# Patient Record
Sex: Female | Born: 1955 | Smoking: Former smoker
Health system: Southern US, Community
[De-identification: ages and names within clinical notes are randomized; demographics above are authoritative.]

## PROBLEM LIST (undated history)

## (undated) DIAGNOSIS — I739 Peripheral vascular disease, unspecified: Secondary | ICD-10-CM

## (undated) DIAGNOSIS — Z9981 Dependence on supplemental oxygen: Secondary | ICD-10-CM

## (undated) DIAGNOSIS — F17200 Nicotine dependence, unspecified, uncomplicated: Secondary | ICD-10-CM

## (undated) DIAGNOSIS — J449 Chronic obstructive pulmonary disease, unspecified: Secondary | ICD-10-CM

## (undated) DIAGNOSIS — I10 Essential (primary) hypertension: Secondary | ICD-10-CM

## (undated) HISTORY — DX: Dependence on supplemental oxygen: Z99.81

## (undated) HISTORY — PX: ABDOMINAL HYSTERECTOMY: SHX81

## (undated) HISTORY — PX: NECK SURGERY: SHX720

## (undated) HISTORY — PX: TONSILLECTOMY: SUR1361

## (undated) HISTORY — PX: MASTOIDECTOMY: SHX711

---

## 2006-04-13 ENCOUNTER — Other Ambulatory Visit: Payer: Self-pay

## 2006-04-13 ENCOUNTER — Emergency Department: Payer: Self-pay | Admitting: Emergency Medicine

## 2008-06-23 ENCOUNTER — Emergency Department: Payer: Self-pay | Admitting: Unknown Physician Specialty

## 2011-11-16 ENCOUNTER — Ambulatory Visit: Payer: Self-pay | Admitting: Gastroenterology

## 2011-11-29 ENCOUNTER — Ambulatory Visit: Payer: Self-pay | Admitting: Internal Medicine

## 2012-01-07 ENCOUNTER — Ambulatory Visit: Payer: Self-pay | Admitting: Gastroenterology

## 2012-01-09 LAB — PATHOLOGY REPORT

## 2015-01-24 DIAGNOSIS — J441 Chronic obstructive pulmonary disease with (acute) exacerbation: Secondary | ICD-10-CM | POA: Insufficient documentation

## 2015-02-14 ENCOUNTER — Emergency Department: Payer: BLUE CROSS/BLUE SHIELD

## 2015-02-14 ENCOUNTER — Inpatient Hospital Stay
Admission: EM | Admit: 2015-02-14 | Discharge: 2015-02-16 | DRG: 194 | Disposition: A | Discharge status: Home / Self Care | Payer: BLUE CROSS/BLUE SHIELD | Attending: Internal Medicine | Admitting: Internal Medicine

## 2015-02-14 ENCOUNTER — Encounter: Payer: Self-pay | Admitting: Emergency Medicine

## 2015-02-14 DIAGNOSIS — J189 Pneumonia, unspecified organism: Secondary | ICD-10-CM | POA: Diagnosis present

## 2015-02-14 DIAGNOSIS — Z90710 Acquired absence of both cervix and uterus: Secondary | ICD-10-CM | POA: Diagnosis not present

## 2015-02-14 DIAGNOSIS — Z825 Family history of asthma and other chronic lower respiratory diseases: Secondary | ICD-10-CM

## 2015-02-14 DIAGNOSIS — Z716 Tobacco abuse counseling: Secondary | ICD-10-CM | POA: Diagnosis present

## 2015-02-14 DIAGNOSIS — Z7951 Long term (current) use of inhaled steroids: Secondary | ICD-10-CM | POA: Diagnosis not present

## 2015-02-14 DIAGNOSIS — F17210 Nicotine dependence, cigarettes, uncomplicated: Secondary | ICD-10-CM | POA: Diagnosis present

## 2015-02-14 DIAGNOSIS — Z79899 Other long term (current) drug therapy: Secondary | ICD-10-CM | POA: Diagnosis not present

## 2015-02-14 DIAGNOSIS — Z8249 Family history of ischemic heart disease and other diseases of the circulatory system: Secondary | ICD-10-CM

## 2015-02-14 DIAGNOSIS — Z9889 Other specified postprocedural states: Secondary | ICD-10-CM

## 2015-02-14 DIAGNOSIS — F17200 Nicotine dependence, unspecified, uncomplicated: Secondary | ICD-10-CM | POA: Insufficient documentation

## 2015-02-14 DIAGNOSIS — J441 Chronic obstructive pulmonary disease with (acute) exacerbation: Secondary | ICD-10-CM | POA: Diagnosis present

## 2015-02-14 HISTORY — DX: Chronic obstructive pulmonary disease, unspecified: J44.9

## 2015-02-14 HISTORY — DX: Nicotine dependence, unspecified, uncomplicated: F17.200

## 2015-02-14 LAB — BASIC METABOLIC PANEL
Anion gap: 11 (ref 5–15)
BUN: 23 mg/dL — ABNORMAL HIGH (ref 6–20)
CHLORIDE: 99 mmol/L — AB (ref 101–111)
CO2: 25 mmol/L (ref 22–32)
Calcium: 8.7 mg/dL — ABNORMAL LOW (ref 8.9–10.3)
Creatinine, Ser: 0.92 mg/dL (ref 0.44–1.00)
GFR calc Af Amer: >60 mL/min (ref >60)
Glucose, Bld: 104 mg/dL — ABNORMAL HIGH (ref 65–99)
Potassium: 3.9 mmol/L (ref 3.5–5.1)
Sodium: 135 mmol/L (ref 135–145)

## 2015-02-14 LAB — BLOOD GAS, VENOUS
ACID-BASE EXCESS: 4.9 mmol/L — AB (ref 0.0–3.0)
BICARBONATE: 29.2 meq/L — AB (ref 21.0–28.0)
PH VEN: 7.46 — AB (ref 7.320–7.430)
Patient temperature: 37
pCO2, Ven: 41 mmHg — ABNORMAL LOW (ref 44.0–60.0)

## 2015-02-14 LAB — CBC WITH DIFFERENTIAL/PLATELET
BASOS PCT: 1 %
Basophils Absolute: 0.1 10*3/uL (ref 0–0.1)
EOS ABS: 0.1 10*3/uL (ref 0–0.7)
Eosinophils Relative: 1 %
HEMATOCRIT: 50 % — AB (ref 35.0–47.0)
Hemoglobin: 16.9 g/dL — ABNORMAL HIGH (ref 12.0–16.0)
LYMPHS PCT: 25 %
Lymphs Abs: 3.3 10*3/uL (ref 1.0–3.6)
MCH: 33.8 pg (ref 26.0–34.0)
MCHC: 33.7 g/dL (ref 32.0–36.0)
MCV: 100.4 fL — ABNORMAL HIGH (ref 80.0–100.0)
MONOS PCT: 9 %
Monocytes Absolute: 1.2 10*3/uL — ABNORMAL HIGH (ref 0.2–0.9)
NEUTROS ABS: 8.4 10*3/uL — AB (ref 1.4–6.5)
NEUTROS PCT: 64 %
PLATELETS: 297 10*3/uL (ref 150–440)
RBC: 4.99 MIL/uL (ref 3.80–5.20)
RDW: 13.9 % (ref 11.5–14.5)
WBC: 13 10*3/uL — AB (ref 3.6–11.0)

## 2015-02-14 LAB — TROPONIN I

## 2015-02-14 MED ORDER — GUAIFENESIN ER 600 MG PO TB12
600.0000 mg | ORAL_TABLET | Freq: Two times a day (BID) | ORAL | Status: DC
  Administered 2015-02-14 – 2015-02-16 (×5): 600 mg via ORAL
  Filled 2015-02-14 (×5): qty 1

## 2015-02-14 MED ORDER — IBUPROFEN 400 MG PO TABS: 200.0000 mg | ORAL_TABLET | Freq: Four times a day (QID) | ORAL | Status: DC | PRN

## 2015-02-14 MED ORDER — ALUM & MAG HYDROXIDE-SIMETH 200-200-20 MG/5ML PO SUSP: 30.0000 mL | Freq: Four times a day (QID) | ORAL | Status: DC | PRN

## 2015-02-14 MED ORDER — AZITHROMYCIN 250 MG PO TABS
500.0000 mg | ORAL_TABLET | Freq: Once | ORAL | Status: AC
  Administered 2015-02-14: 500 mg via ORAL

## 2015-02-14 MED ORDER — TIOTROPIUM BROMIDE MONOHYDRATE 18 MCG IN CAPS
18.0000 ug | ORAL_CAPSULE | Freq: Every day | RESPIRATORY_TRACT | Status: DC
  Administered 2015-02-14 – 2015-02-16 (×3): 18 ug via RESPIRATORY_TRACT
  Filled 2015-02-14: qty 5

## 2015-02-14 MED ORDER — DEXTROSE 5 % IV SOLN
500.0000 mg | INTRAVENOUS | Status: DC
  Administered 2015-02-15 – 2015-02-16 (×2): 500 mg via INTRAVENOUS
  Filled 2015-02-14 (×3): qty 500

## 2015-02-14 MED ORDER — AZITHROMYCIN 250 MG PO TABS
ORAL_TABLET | ORAL | Status: AC
  Administered 2015-02-14: 500 mg via ORAL
  Filled 2015-02-14: qty 2

## 2015-02-14 MED ORDER — CEFTRIAXONE SODIUM IN DEXTROSE 20 MG/ML IV SOLN
1.0000 g | INTRAVENOUS | Status: DC
  Administered 2015-02-15 – 2015-02-16 (×2): 1 g via INTRAVENOUS
  Filled 2015-02-14 (×3): qty 50

## 2015-02-14 MED ORDER — METHYLPREDNISOLONE SODIUM SUCC 125 MG IJ SOLR
125.0000 mg | Freq: Once | INTRAMUSCULAR | Status: AC
  Administered 2015-02-14: 125 mg via INTRAVENOUS

## 2015-02-14 MED ORDER — SODIUM CHLORIDE 0.9 % IV SOLN
INTRAVENOUS | Status: DC
  Administered 2015-02-14 – 2015-02-15 (×2): via INTRAVENOUS

## 2015-02-14 MED ORDER — GUAIFENESIN-DM 100-10 MG/5ML PO SYRP
5.0000 mL | ORAL_SOLUTION | ORAL | Status: DC | PRN
  Filled 2015-02-14: qty 5

## 2015-02-14 MED ORDER — BENZONATATE 100 MG PO CAPS
200.0000 mg | ORAL_CAPSULE | Freq: Three times a day (TID) | ORAL | Status: DC | PRN
Start: 2015-02-14 — End: 2015-02-16

## 2015-02-14 MED ORDER — MOMETASONE FURO-FORMOTEROL FUM 100-5 MCG/ACT IN AERO
2.0000 | INHALATION_SPRAY | Freq: Two times a day (BID) | RESPIRATORY_TRACT | Status: DC
  Administered 2015-02-14 – 2015-02-16 (×5): 2 via RESPIRATORY_TRACT
  Filled 2015-02-14: qty 8.8

## 2015-02-14 MED ORDER — ACETAMINOPHEN 650 MG RE SUPP: 650.0000 mg | Freq: Four times a day (QID) | RECTAL | Status: DC | PRN

## 2015-02-14 MED ORDER — HYDROCODONE-ACETAMINOPHEN 5-325 MG PO TABS: 1.0000 | ORAL_TABLET | ORAL | Status: DC | PRN

## 2015-02-14 MED ORDER — IPRATROPIUM-ALBUTEROL 0.5-2.5 (3) MG/3ML IN SOLN
RESPIRATORY_TRACT | Status: AC
  Filled 2015-02-14: qty 3

## 2015-02-14 MED ORDER — IPRATROPIUM-ALBUTEROL 0.5-2.5 (3) MG/3ML IN SOLN
RESPIRATORY_TRACT | Status: AC
  Administered 2015-02-14: 3 mL via RESPIRATORY_TRACT
  Filled 2015-02-14: qty 6

## 2015-02-14 MED ORDER — METHYLPREDNISOLONE SODIUM SUCC 125 MG IJ SOLR
INTRAMUSCULAR | Status: AC
  Administered 2015-02-14: 125 mg via INTRAVENOUS
  Filled 2015-02-14: qty 2

## 2015-02-14 MED ORDER — CEFTRIAXONE SODIUM IN DEXTROSE 40 MG/ML IV SOLN
2.0000 g | Freq: Once | INTRAVENOUS | Status: AC
  Administered 2015-02-14: 2 g via INTRAVENOUS
  Filled 2015-02-14: qty 50

## 2015-02-14 MED ORDER — IPRATROPIUM-ALBUTEROL 0.5-2.5 (3) MG/3ML IN SOLN
3.0000 mL | Freq: Once | RESPIRATORY_TRACT | Status: AC
  Administered 2015-02-14: 3 mL via RESPIRATORY_TRACT

## 2015-02-14 MED ORDER — METHYLPREDNISOLONE SODIUM SUCC 125 MG IJ SOLR
60.0000 mg | Freq: Four times a day (QID) | INTRAMUSCULAR | Status: DC
  Administered 2015-02-14 – 2015-02-16 (×7): 60 mg via INTRAVENOUS
  Filled 2015-02-14 (×7): qty 2

## 2015-02-14 MED ORDER — NICOTINE 21 MG/24HR TD PT24
21.0000 mg | MEDICATED_PATCH | Freq: Every day | TRANSDERMAL | Status: DC
  Administered 2015-02-14 – 2015-02-16 (×3): 21 mg via TRANSDERMAL
  Filled 2015-02-14 (×3): qty 1

## 2015-02-14 MED ORDER — ONDANSETRON HCL 4 MG PO TABS: 4.0000 mg | ORAL_TABLET | Freq: Four times a day (QID) | ORAL | Status: DC | PRN

## 2015-02-14 MED ORDER — IPRATROPIUM BROMIDE 0.02 % IN SOLN
0.5000 mg | Freq: Four times a day (QID) | RESPIRATORY_TRACT | Status: DC
  Administered 2015-02-14 – 2015-02-16 (×7): 0.5 mg via RESPIRATORY_TRACT
  Filled 2015-02-14 (×7): qty 2.5

## 2015-02-14 MED ORDER — ALBUTEROL SULFATE (2.5 MG/3ML) 0.083% IN NEBU
2.5000 mg | INHALATION_SOLUTION | Freq: Four times a day (QID) | RESPIRATORY_TRACT | Status: DC
  Administered 2015-02-14 – 2015-02-16 (×8): 2.5 mg via RESPIRATORY_TRACT
  Filled 2015-02-14 (×8): qty 3

## 2015-02-14 MED ORDER — ENOXAPARIN SODIUM 40 MG/0.4ML ~~LOC~~ SOLN
40.0000 mg | SUBCUTANEOUS | Status: DC
  Administered 2015-02-14 – 2015-02-15 (×2): 40 mg via SUBCUTANEOUS
  Filled 2015-02-14 (×2): qty 0.4

## 2015-02-14 MED ORDER — ONDANSETRON HCL 4 MG/2ML IJ SOLN: 4.0000 mg | Freq: Four times a day (QID) | INTRAMUSCULAR | Status: DC | PRN

## 2015-02-14 MED ORDER — ACETAMINOPHEN 325 MG PO TABS: 650.0000 mg | ORAL_TABLET | Freq: Four times a day (QID) | ORAL | Status: DC | PRN

## 2015-02-14 NOTE — H&P (Signed)
[facility] [place] NAME: Wendy Ferrell    MR#:  [id number]  DATE OF BIRTH:  April 27, 1956  DATE OF ADMISSION:  02/14/2015  PRIMARY CARE PHYSICIAN: No PCP Per Patient   REQUESTING/REFERRING PHYSICIAN:   CHIEF COMPLAINT:   Chief Complaint  Patient presents with  . Shortness of Breath    HISTORY OF PRESENT ILLNESS: Wendy Ferrell  is a 59 y.o. female with a known history of COPD for past 5 years continue nicotine addiction, who presented to the emergency department complaining of shortness of breath and cough ongoing for the past 3 weeks. She was seen as outpatient for the symptoms she was treated with a prednisone taper and Levaquin. Despite being treated with these things she did not improve she continued to have significant shortness of breath and wheezing. She has had a cough which is minimally productive. She has not had any fevers or chills has had chest pain with coughing. Denies any nausea vomiting or diarrhea.  PAST MEDICAL HISTORY:   Past Medical History  Diagnosis Date  . COPD (chronic obstructive pulmonary disease)   . Nicotine addiction     PAST SURGICAL HISTORY:  Past Surgical History  Procedure Laterality Date  . Abdominal hysterectomy    . Neck surgery    . Mastoidectomy      SOCIAL HISTORY:  History  Substance Use Topics  . Smoking status: Current Some Day Smoker -- 0.50 packs/day for 45 years    Types: Cigarettes  . Smokeless tobacco: Not on file  . Alcohol Use: No    FAMILY HISTORY:  Family History  Problem Relation Age of Onset  . COPD Mother   . Hypertension Mother   . COPD Father     DRUG ALLERGIES: No Known Allergies  REVIEW OF SYSTEMS:   CONSTITUTIONAL: No fever, fatigue or weakness.  EYES: No blurred or double vision.  EARS, NOSE, AND THROAT: No tinnitus or ear pain.  RESPIRATORY: Positive cough, positive shortness of breath, positive wheezing  no hemoptysis.  CARDIOVASCULAR: No chest pain,  orthopnea, edema.  GASTROINTESTINAL: No nausea, vomiting, diarrhea or abdominal pain.  GENITOURINARY: No dysuria, hematuria.  ENDOCRINE: No polyuria, nocturia,  HEMATOLOGY: No anemia, easy bruising or bleeding SKIN: No rash or lesion. MUSCULOSKELETAL: No joint pain or arthritis.   NEUROLOGIC: No tingling, numbness, weakness.  PSYCHIATRY: No anxiety or depression.   MEDICATIONS AT HOME:  Prior to Admission medications   Medication Sig Start Date End Date Taking? Authorizing Provider  albuterol (PROVENTIL HFA;VENTOLIN HFA) 108 (90 BASE) MCG/ACT inhaler Inhale 2 puffs into the lungs every 6 (six) hours as needed for wheezing or shortness of breath.   Yes Historical Provider, MD  benzonatate (TESSALON) 200 MG capsule Take 200 mg by mouth 3 (three) times daily as needed for cough.   Yes Historical Provider, MD  ibuprofen (ADVIL,MOTRIN) 200 MG tablet Take 200 mg by mouth every 6 (six) hours as needed for moderate pain.   Yes Historical Provider, MD  levofloxacin (LEVAQUIN) 500 MG tablet Take 500 mg by mouth daily.   Yes Historical Provider, MD      PHYSICAL EXAMINATION:   VITAL SIGNS: Blood pressure 154/71, pulse 85, temperature 97.5 F (36.4 C), temperature source Oral, resp. rate 20, height 5\' 4"  (1.626 m), weight 39.463 kg (87 lb), SpO2 98 %.  GENERAL:  59 y.o.-year-old patient lying in the bed with no acute distress.  EYES: Pupils equal, round, reactive to light and accommodation. No  scleral icterus. Extraocular muscles intact.  HEENT: Head atraumatic, normocephalic. Oropharynx and nasopharynx clear.  NECK:  Supple, no jugular venous distention. No thyroid enlargement, no tenderness.  LUNGS: Diminished breath sounds bilaterally, positive wheezing, rales,rhonchi or crepitation. No use of accessory muscles of respiration.  CARDIOVASCULAR: S1, S2 normal. No murmurs, rubs, or gallops.  ABDOMEN: Soft, nontender, nondistended. Bowel sounds present. No organomegaly or mass.  EXTREMITIES: No  pedal edema, cyanosis, or clubbing.  NEUROLOGIC: Cranial nerves II through XII are intact. Muscle strength 5/5 in all extremities. Sensation intact. Gait not checked.  PSYCHIATRIC: The patient is alert and oriented x 3.  SKIN: No obvious rash, lesion, or ulcer.   LABORATORY PANEL:   CBC  Recent Labs Lab 02/14/15 0852  WBC 13.0*  HGB 16.9*  HCT 50.0*  PLT 297  MCV 100.4*  MCH 33.8  MCHC 33.7  RDW 13.9  LYMPHSABS 3.3  MONOABS 1.2*  EOSABS 0.1  BASOSABS 0.1   ------------------------------------------------------------------------------------------------------------------  Chemistries   Recent Labs Lab 02/14/15 0852  NA 135  K 3.9  CL 99*  CO2 25  GLUCOSE 104*  BUN 23*  CREATININE 0.92  CALCIUM 8.7*   ------------------------------------------------------------------------------------------------------------------ estimated creatinine clearance is 42.1 mL/min (by C-G formula based on Cr of 0.92). ------------------------------------------------------------------------------------------------------------------ No results for input(s): TSH, T4TOTAL, T3FREE, THYROIDAB in the last 72 hours.  Invalid input(s): FREET3   Coagulation profile No results for input(s): INR, PROTIME in the last 168 hours. ------------------------------------------------------------------------------------------------------------------- No results for input(s): DDIMER in the last 72 hours. -------------------------------------------------------------------------------------------------------------------  Cardiac Enzymes  Recent Labs Lab 02/14/15 0852  TROPONINI <0.03   ------------------------------------------------------------------------------------------------------------------ Invalid input(s): POCBNP  ---------------------------------------------------------------------------------------------------------------  Urinalysis No results found for: COLORURINE, APPEARANCEUR,  LABSPEC, PHURINE, GLUCOSEU, HGBUR, BILIRUBINUR, KETONESUR, PROTEINUR, UROBILINOGEN, NITRITE, LEUKOCYTESUR   RADIOLOGY: Dg Chest Port 1 View  02/14/2015   CLINICAL DATA:  Shortness of breath.  Recent diagnosis of pneumonia.  EXAM: PORTABLE CHEST - 1 VIEW  COMPARISON:  No prior studies for comparison. A report from a [facility] chest x-ray dated 01/24/2015 is available.  FINDINGS: Lungs are moderately hyperinflated. Interstitial prominence and bronchovascular prominence likely relates to underlying COPD. There are some patchy areas of density at both lung bases which may represent subtle infiltrates versus chronic areas of scarring. No pulmonary edema, pneumothorax or pleural fluid is identified. Cardiac and mediastinal contours are within normal limits.  IMPRESSION: Evidence of underlying COPD. Patchy areas of density at both lung bases may represent subtle infiltrates versus chronic opacities.   Electronically Signed   By: Aletta Edouard M.D.   On: 02/14/2015 09:37    EKG: Orders placed or performed during the hospital encounter of 02/14/15  . ED EKG  . ED EKG  . ED EKG  . ED EKG    IMPRESSION AND PLAN: Patient is a 59 year old white female with the diagnosis of COPD presents with shortness of breath and cough 1.  Acute on chronic COPD exasperation: At this time patient will be placed on Nebulizers every 6 hours, IV Solu-Medrol. Due to chest x-ray concern for possible pneumonia she will also be on azithromycin and ceftriaxone.  I will add Mucinex to her current regimen. Also for her chronic COPD patient will be started on Spiriva as well as Dulera.  2. Suspected community-acquired pneumonia: IV anabiotic's with ceftriaxone azithromycin   3. Nicotine addiction: Smoking cessation done, 4 minutes spent recommend that she stop smoking she is willing to try nicotine patch while in the hospital     All the records  are reviewed and case discussed with ED provider. Management plans  discussed with the patient, family and they are in agreement.  CODE STATUS:    Code Status Orders        Start     Ordered   02/14/15 1346  Full code   Continuous     02/14/15 1345       TOTAL TIME TAKING CARE OF THIS PATIENT: 55 min.    Dustin Flock M.D on 02/14/2015 at 3:14 PM  Between 7am to 6pm - Pager - [phone]  After 6pm go to [email or web address] - password [email or web address] [facility]  [place] [facility]  Office  [phone]  CC: Primary care physician; No PCP Per Patient

## 2015-02-14 NOTE — ED Provider Notes (Signed)
[facility] Emergency Department Provider Note  ____________________________________________  Time seen: Approximately 8:49 AM  I have reviewed the triage vital signs and the nursing notes.   HISTORY  Chief Complaint Shortness of Breath    HPI Wendy Ferrell is a 59 y.o. female with a history of COPD who is been having about 3 weeks of ongoing shortness of breath which has been steadily worsening. The patient has been seen twice in urgent care and placed on antibiotic's as well as prednisone taper. She comes to the ER as her symptoms are continuing to worsen to the point she can no longer walk to the bathroom without getting extremely short of breath. She is wheezing significantly.  The patient denies any chest pain. She has a nonproductive cough and denies fevers or chills.  Patient states onset was slow over the last 3 weeks but steadily worsening despite treatments as an outpatient. This worsened significantly with attempting to walk. No recent surgeries.   Past Medical History  Diagnosis Date  . COPD (chronic obstructive pulmonary disease)     There are no active problems to display for this patient.   Past Surgical History  Procedure Laterality Date  . Abdominal hysterectomy    . Neck surgery      No current outpatient prescriptions on file.  Allergies Review of patient's allergies indicates no known allergies.  No family history on file.  Social History History  Substance Use Topics  . Smoking status: Current Some Day Smoker  . Smokeless tobacco: Not on file  . Alcohol Use: No    Review of Systems Constitutional: No fever/chills Eyes: No visual changes. ENT: No sore throat. Cardiovascular: Denies chest pain. Chest does feel "tight" because it is hard to breathe. Respiratory: See history of present illness Gastrointestinal: No abdominal pain.  No nausea, no vomiting.  No diarrhea.  No constipation. Genitourinary: Negative for  dysuria. Musculoskeletal: Negative for back pain. Skin: Negative for rash. Neurological: Negative for headaches, focal weakness or numbness.  10-point ROS otherwise negative.  No recent surgeries. No leg swelling. No history of blood clots. She does have about 13 pounds weight loss over the last month. ____________________________________________   PHYSICAL EXAM:  VITAL SIGNS: ED Triage Vitals  Enc Vitals Group     BP 02/14/15 0831 155/76 mmHg     Pulse Rate 02/14/15 0831 111     Resp 02/14/15 0831 24     Temp 02/14/15 0831 97.8 F (36.6 C)     Temp Source 02/14/15 0831 Oral     SpO2 02/14/15 0831 92 %     Weight 02/14/15 0831 87 lb (39.463 kg)     Height 02/14/15 0831 5\' 4"  (1.626 m)     Head Cir --      Peak Flow --      Pain Score 02/14/15 0832 10     Pain Loc --      Pain Edu? --      Excl. in [place]? --     Constitutional: Alert and oriented. Sitting straight up in bed, moderate increased work of breathing with use of accessory muscles. She is speaking in 1-2 word sentences and short phrases. Eyes: Conjunctivae are normal. PERRL. EOMI. Head: Atraumatic. Nose: No congestion/rhinnorhea. Mouth/Throat: Mucous membranes are moist.  Oropharynx non-erythematous. Neck: No stridor.   Cardiovascular: Tachycardic rate, regular rhythm. Grossly normal heart sounds.  Good peripheral circulation. Respiratory: Use of accessory muscles, moderate increased work of breathing, diffuse end expiratory wheezing throughout  all lung lobes. No rales or rhonchi are appreciated. Gastrointestinal: Soft and nontender. No distention. No abdominal bruits. No CVA tenderness. Musculoskeletal: No lower extremity tenderness nor edema.  No joint effusions. Neurologic:  Normal speech and language. No gross focal neurologic deficits are appreciated. Speech is normal. Skin:  Skin is warm, dry and intact. No rash noted. Psychiatric: Mood and affect are normal. Speech and behavior are  normal.  ____________________________________________   LABS (all labs ordered are listed, but only abnormal results are displayed)  Labs Reviewed  CBC WITH DIFFERENTIAL/PLATELET - Abnormal; Notable for the following:    WBC 13.0 (*)    Hemoglobin 16.9 (*)    HCT 50.0 (*)    MCV 100.4 (*)    Neutro Abs 8.4 (*)    Monocytes Absolute 1.2 (*)    All other components within normal limits  BLOOD GAS, VENOUS - Abnormal; Notable for the following:    pH, Ven 7.46 (*)    pCO2, Ven 41 (*)    Bicarbonate 29.2 (*)    Acid-Base Excess 4.9 (*)    All other components within normal limits  BASIC METABOLIC PANEL - Abnormal; Notable for the following:    Chloride 99 (*)    Glucose, Bld 104 (*)    BUN 23 (*)    Calcium 8.7 (*)    All other components within normal limits  CULTURE, BLOOD (ROUTINE X 2)  CULTURE, BLOOD (ROUTINE X 2)  TROPONIN I   ____________________________________________  EKG   ____________________________________________  RADIOLOGY  IMPRESSION: Evidence of underlying COPD. Patchy areas of density at both lung bases may represent subtle infiltrates versus chronic opacities. ____________________________________________   PROCEDURES  Procedure(s) performed: None  Critical Care performed: No  ____________________________________________   INITIAL IMPRESSION / ASSESSMENT AND PLAN / ED COURSE  Pertinent labs & imaging results that were available during my care of the patient were reviewed by me and considered in my medical decision making (see chart for details).  Patient presents with increasing work of breathing for 3 weeks despite outpatient treatments for COPD and/or pneumonia. The patient clearly has symptoms that are most suggestive of a COPD exacerbation which may have an underlying pneumonia, we will wait for chest x-ray. She is currently afebrile. I have initiated stacked DuoNeb therapy, Solu-Medrol, and we will continue with good pulmonary treatments  while we await further lab tests and evaluation. I doubt ACS as the patient denies any chest pain other than "tightness which she has had for the last 3 weeks.  ----------------------------------------- 9:35 AM on 02/14/2015 -----------------------------------------  She is breathing much more comfortably. She is currently on 2 L nasal cannula and satting 93%, but her work of breathing has improved tremendously. She does still have mild ongoing and neck supported wheezing. She states she feels much much better. She is actually able to recline in the bed and breathe comfortably.  Await labs and chest x-ray.  ----------------------------------------- 10:04 AM on 02/14/2015 -----------------------------------------  Chest x-ray demonstrates bilateral lower lobe opacities. In the setting of recent antibiotic, elevated white count, and work of breathing I suspect this likely does represent a small but ongoing pneumonia. Based on the patient's increased work of breathing,been on 4 days of outpatient Levaquin, I would consider her failing outpatient therapy at this point.  Discussed with the patient, she is agreeable with admission. In addition, discussed with hospitalist. We will admit the patient for treatment of failed outpatient therapy for pneumonia. In addition, good pulmonary toilet and treatment for COPD.  ____________________________________________   FINAL CLINICAL IMPRESSION(S) / ED DIAGNOSES COPD exacerbation, acute Pneumonia bilateral, failed outpatient   Delman Kitten, MD 02/14/15 1553

## 2015-02-14 NOTE — ED Notes (Signed)
Pt placed on 1L [place], o2 sat 92% on o2

## 2015-02-14 NOTE — ED Notes (Signed)
Patient presents to ED with c/o shortness of breath x 3 weeks.  Patient was seen at Urgent Care x2 and diagnosed with pneumonia.  Patient states has been taking her meds as directed, but continues to have shortness of breath.  Patient speaking in short sentences with audible wheezing.

## 2015-02-14 NOTE — ED Notes (Signed)
Pt recently being treated for pneumonia, pt arrives with increasing SOB, pt sitting on edge of bed, pt speaking in short sentences, pt noted with increased work of breathing, MD at bedside to evaluate

## 2015-02-15 LAB — BASIC METABOLIC PANEL
Anion gap: 8 (ref 5–15)
BUN: 19 mg/dL (ref 6–20)
CALCIUM: 8.7 mg/dL — AB (ref 8.9–10.3)
CO2: 30 mmol/L (ref 22–32)
Chloride: 102 mmol/L (ref 101–111)
Creatinine, Ser: 0.8 mg/dL (ref 0.44–1.00)
Glucose, Bld: 164 mg/dL — ABNORMAL HIGH (ref 65–99)
POTASSIUM: 4.8 mmol/L (ref 3.5–5.1)
SODIUM: 140 mmol/L (ref 135–145)

## 2015-02-15 LAB — CBC
HCT: 47.3 % — ABNORMAL HIGH (ref 35.0–47.0)
Hemoglobin: 16.1 g/dL — ABNORMAL HIGH (ref 12.0–16.0)
MCH: 34.4 pg — ABNORMAL HIGH (ref 26.0–34.0)
MCHC: 34 g/dL (ref 32.0–36.0)
MCV: 101.2 fL — ABNORMAL HIGH (ref 80.0–100.0)
PLATELETS: 229 10*3/uL (ref 150–440)
RBC: 4.67 MIL/uL (ref 3.80–5.20)
RDW: 13.6 % (ref 11.5–14.5)
WBC: 8.9 10*3/uL (ref 3.6–11.0)

## 2015-02-15 NOTE — Progress Notes (Signed)
[place] [facility] [place] NAME: Wendy Ferrell    MR#:  [id number]  DATE OF BIRTH:  1956/04/24  SUBJECTIVE:  CHIEF COMPLAINT:   Chief Complaint  Patient presents with  . Shortness of Breath   Feeling somewhat better. Still with cough, wheezing and thick sputum  REVIEW OF SYSTEMS:   Review of Systems  Constitutional: Negative for fever.  Respiratory: Positive for cough, sputum production, shortness of breath and wheezing.   Cardiovascular: Negative for chest pain and palpitations.  Gastrointestinal: Negative for nausea, vomiting and abdominal pain.  Genitourinary: Negative for dysuria.   DRUG ALLERGIES:  No Known Allergies  VITALS:  Blood pressure 138/58, pulse 108, temperature 97.8 F (36.6 C), temperature source Oral, resp. rate 18, height 5\' 4"  (1.626 m), weight 39.463 kg (87 lb), SpO2 96 %.  PHYSICAL EXAMINATION:  GENERAL:  59 y.o.-year-old patient lying in the bed with no acute distress. Thin EYES: Pupils equal, round, reactive to light and accommodation. No scleral icterus. Extraocular muscles intact.  HEENT: Head atraumatic, normocephalic. Oropharynx and nasopharynx clear.  NECK:  Supple, no jugular venous distention. No thyroid enlargement, no tenderness.  LUNGS: Diffuse wheezes, coarse breath sounds, scattered rhonchi, fair air movement CARDIOVASCULAR: S1, S2 normal. No murmurs, rubs, or gallops.  ABDOMEN: Soft, nontender, nondistended. Bowel sounds present. No organomegaly or mass.  EXTREMITIES: No pedal edema, cyanosis, or clubbing.  NEUROLOGIC: Cranial nerves II through XII are intact. Muscle strength 5/5 in all extremities. Sensation intact. Gait not checked.  PSYCHIATRIC: The patient is alert and oriented x 3.  SKIN: No obvious rash, lesion, or ulcer.    LABORATORY PANEL:   CBC  Recent Labs Lab 02/15/15 0512  WBC 8.9  HGB 16.1*  HCT 47.3*  PLT 229    ------------------------------------------------------------------------------------------------------------------  Chemistries   Recent Labs Lab 02/15/15 0512  NA 140  K 4.8  CL 102  CO2 30  GLUCOSE 164*  BUN 19  CREATININE 0.80  CALCIUM 8.7*   ------------------------------------------------------------------------------------------------------------------  Cardiac Enzymes  Recent Labs Lab 02/14/15 0852  TROPONINI <0.03   ------------------------------------------------------------------------------------------------------------------  RADIOLOGY:  Dg Chest Port 1 View  02/14/2015   CLINICAL DATA:  Shortness of breath.  Recent diagnosis of pneumonia.  EXAM: PORTABLE CHEST - 1 VIEW  COMPARISON:  No prior studies for comparison. A report from a [facility] chest x-ray dated 01/24/2015 is available.  FINDINGS: Lungs are moderately hyperinflated. Interstitial prominence and bronchovascular prominence likely relates to underlying COPD. There are some patchy areas of density at both lung bases which may represent subtle infiltrates versus chronic areas of scarring. No pulmonary edema, pneumothorax or pleural fluid is identified. Cardiac and mediastinal contours are within normal limits.  IMPRESSION: Evidence of underlying COPD. Patchy areas of density at both lung bases may represent subtle infiltrates versus chronic opacities.   Electronically Signed   By: Aletta Edouard M.D.   On: 02/14/2015 09:37    EKG:   Orders placed or performed during the hospital encounter of 02/14/15  . ED EKG  . ED EKG  . ED EKG  . ED EKG  . EKG 12-Lead  . EKG 12-Lead    ASSESSMENT AND PLAN:   Principal Problem:   Community acquired pneumonia Active Problems:   COPD with exacerbation   COPD exacerbation   #1 community-acquired pneumonia: Blood cultures pending. Leukocytosis improving. Continue Rocephin and azithromycin, Mucinex, Tessalon  #2 COPD exacerbation: Not on oxygen at home.  Continue with supplemental oxygen as needed.  Continue Solu-Medrol, Spiriva, DuoNeb's, Dulera  #3 ongoing tobacco abuse: Counseled to quit for 3-5 minutes. Nicotine patch in place    All the records are reviewed and case discussed with Care Management/Social Workerr. Management plans discussed with the patient, family and they are in agreement.  CODE STATUS: Full  TOTAL TIME TAKING CARE OF THIS PATIENT: 35 minutes.   POSSIBLE D/C IN 1-2 DAYS, DEPENDING ON CLINICAL CONDITION.   Myrtis Ser M.D on 02/15/2015 at 2:58 PM  Between 7am to 6pm - Pager - [phone]  After 6pm go to [email or web address] - password [email or web address] [facility]  [place] [facility]  Office  [phone]  CC: Primary care physician; No PCP Per Patient

## 2015-02-16 LAB — BASIC METABOLIC PANEL
Anion gap: 8 (ref 5–15)
BUN: 19 mg/dL (ref 6–20)
CALCIUM: 8.3 mg/dL — AB (ref 8.9–10.3)
CHLORIDE: 105 mmol/L (ref 101–111)
CO2: 27 mmol/L (ref 22–32)
Creatinine, Ser: 0.78 mg/dL (ref 0.44–1.00)
GFR calc non Af Amer: >60 mL/min (ref >60)
GLUCOSE: 129 mg/dL — AB (ref 65–99)
Potassium: 4 mmol/L (ref 3.5–5.1)
Sodium: 140 mmol/L (ref 135–145)

## 2015-02-16 LAB — CBC
HCT: 43.9 % (ref 35.0–47.0)
HEMOGLOBIN: 14.5 g/dL (ref 12.0–16.0)
MCH: 33.5 pg (ref 26.0–34.0)
MCHC: 33 g/dL (ref 32.0–36.0)
MCV: 101.7 fL — ABNORMAL HIGH (ref 80.0–100.0)
Platelets: 231 10*3/uL (ref 150–440)
RBC: 4.32 MIL/uL (ref 3.80–5.20)
RDW: 13.6 % (ref 11.5–14.5)
WBC: 15.5 10*3/uL — ABNORMAL HIGH (ref 3.6–11.0)

## 2015-02-16 MED ORDER — PREDNISONE 50 MG PO TABS: 50.0000 mg | ORAL_TABLET | Freq: Every day | ORAL | Status: DC

## 2015-02-16 MED ORDER — TIOTROPIUM BROMIDE MONOHYDRATE 18 MCG IN CAPS: 18.0000 ug | ORAL_CAPSULE | Freq: Every day | RESPIRATORY_TRACT | Status: DC

## 2015-02-16 MED ORDER — PSEUDOEPHEDRINE-CODEINE-GG 30-10-100 MG/5ML PO SOLN: 5.0000 mL | Freq: Four times a day (QID) | ORAL | Status: DC | PRN

## 2015-02-16 MED ORDER — ENOXAPARIN SODIUM 30 MG/0.3ML ~~LOC~~ SOLN: 30.0000 mg | SUBCUTANEOUS | Status: DC

## 2015-02-16 MED ORDER — MOXIFLOXACIN HCL 400 MG PO TABS: 400.0000 mg | ORAL_TABLET | Freq: Every day | ORAL | Status: DC

## 2015-02-16 MED ORDER — MOMETASONE FURO-FORMOTEROL FUM 100-5 MCG/ACT IN AERO: 2.0000 | INHALATION_SPRAY | Freq: Two times a day (BID) | RESPIRATORY_TRACT | Status: DC

## 2015-02-16 MED ORDER — PREDNISONE 20 MG PO TABS: 40.0000 mg | ORAL_TABLET | Freq: Every day | ORAL | Status: DC

## 2015-02-16 NOTE — Discharge Instructions (Signed)
Please follow up with your primary care doctor in 2 weeks. We can help you make this appointment.  DIET:  Regular diet  DISCHARGE CONDITION:  Fair  ACTIVITY:  Activity as tolerated  OXYGEN:  Home Oxygen: No.   Oxygen Delivery: room air  DISCHARGE LOCATION:  home   If you experience worsening of your admission symptoms, develop shortness of breath, life threatening emergency, suicidal or homicidal thoughts you must seek medical attention immediately by calling 911 or calling your MD immediately  if symptoms less severe.  You Must read complete instructions/literature along with all the possible adverse reactions/side effects for all the Medicines you take and that have been prescribed to you. Take any new Medicines after you have completely understood and accpet all the possible adverse reactions/side effects.   Please note  You were cared for by a hospitalist during your hospital stay. If you have any questions about your discharge medications or the care you received while you were in the hospital after you are discharged, you can call the unit and asked to speak with the hospitalist on call if the hospitalist that took care of you is not available. Once you are discharged, your primary care physician will handle any further medical issues. Please note that NO REFILLS for any discharge medications will be authorized once you are discharged, as it is imperative that you return to your primary care physician (or establish a relationship with a primary care physician if you do not have one) for your aftercare needs so that they can reassess your need for medications and monitor your lab values.

## 2015-02-16 NOTE — Progress Notes (Signed)
A&O. VSS. Tolerating diet well. No complaints of pain or nausea. Ambulating well with no assistance. Saturations in high 90's on room air. Discharged per MD orders. Discharge instructions reviewed with pt and pt verbalized understanding. IV's removed per policy. Prescriptions submitted electronically per MD. Discharged via wheelchair escorted by nursing staff.

## 2015-02-19 LAB — CULTURE, BLOOD (ROUTINE X 2)
Culture: NO GROWTH
Culture: NO GROWTH
SPECIAL REQUESTS: NORMAL
SPECIAL REQUESTS: NORMAL

## 2015-02-25 NOTE — Discharge Summary (Signed)
[facility] [place]   PATIENT NAME: Wendy Ferrell    MR#:  [id number]  DATE OF BIRTH:  [birth year]  DATE OF ADMISSION:  02/14/2015 ADMITTING PHYSICIAN: Dustin Flock, MD  DATE OF DISCHARGE: 02/16/2015  4:27 PM  PRIMARY CARE PHYSICIAN: No PCP Per Patient    ADMISSION DIAGNOSIS:  COPD exacerbation [J44.1] Bilateral pneumonia [J18.9]  DISCHARGE DIAGNOSIS:  Principal Problem:   Community acquired pneumonia Active Problems:   COPD with exacerbation   COPD exacerbation   SECONDARY DIAGNOSIS:   Past Medical History  Diagnosis Date  . COPD (chronic obstructive pulmonary disease)   . Nicotine addiction     HOSPITAL COURSE:    #1 community-acquired pneumonia: Blood cultures negative. Leukocytosis improving. No additional risk factors for drug resistant pathogens. Will discharge on avelox.   #2 COPD exacerbation: Not on oxygen at home. Continue with supplemental oxygen as needed. Discharge on prednisone taper, dulara and spiriva. Will need to follow up with PCP for on going treatment.  #3 ongoing tobacco abuse: Counseled daily during admission.    DISCHARGE CONDITIONS:   stable  CONSULTS OBTAINED:    none  DRUG ALLERGIES:  No Known Allergies  DISCHARGE MEDICATIONS:   Discharge Medication List as of 02/16/2015  3:38 PM    START taking these medications   Details  mometasone-formoterol (DULERA) 100-5 MCG/ACT AERO Inhale 2 puffs into the lungs 2 (two) times daily., Starting 02/16/2015, Until Discontinued, Normal    moxifloxacin (AVELOX) 400 MG tablet Take 1 tablet (400 mg total) by mouth daily at 8 pm., Starting 02/16/2015, Until Discontinued, Normal    predniSONE (DELTASONE) 20 MG tablet Take 2 tablets (40 mg total) by mouth daily with breakfast., Starting 02/16/2015, Until Discontinued, Normal    tiotropium (SPIRIVA) 18 MCG inhalation capsule Place 1 capsule (18 mcg total) into inhaler and inhale daily., Starting  02/16/2015, Until Discontinued, Normal      CONTINUE these medications which have NOT CHANGED   Details  albuterol (PROVENTIL HFA;VENTOLIN HFA) 108 (90 BASE) MCG/ACT inhaler Inhale 2 puffs into the lungs every 6 (six) hours as needed for wheezing or shortness of breath., Until Discontinued, Historical Med    benzonatate (TESSALON) 200 MG capsule Take 200 mg by mouth 3 (three) times daily as needed for cough., Until Discontinued, Historical Med    ibuprofen (ADVIL,MOTRIN) 200 MG tablet Take 200 mg by mouth every 6 (six) hours as needed for moderate pain., Until Discontinued, Historical Med      STOP taking these medications     levofloxacin (LEVAQUIN) 500 MG tablet          DISCHARGE INSTRUCTIONS:   Please follow up with your primary care doctor in 2 weeks. We can help you make this appointment.  DIET:  Regular diet  DISCHARGE CONDITION:  Fair  ACTIVITY:  Activity as tolerated  OXYGEN:  Home Oxygen: No.   Oxygen Delivery: room air  DISCHARGE LOCATION:  home   If you experience worsening of your admission symptoms, develop shortness of breath, life threatening emergency, suicidal or homicidal thoughts you must seek medical attention immediately by calling 911 or calling your MD immediately  if symptoms less severe.  You Must read complete instructions/literature along with all the possible adverse reactions/side effects for all the Medicines you take and that have been prescribed to you. Take any new Medicines after you have completely understood and accpet all the possible adverse reactions/side effects.   Please note  You were  cared for by a hospitalist during your hospital stay. If you have any questions about your discharge medications or the care you received while you were in the hospital after you are discharged, you can call the unit and asked to speak with the hospitalist on call if the hospitalist that took care of you is not available. Once you are discharged,  your primary care physician will handle any further medical issues. Please note that NO REFILLS for any discharge medications will be authorized once you are discharged, as it is imperative that you return to your primary care physician (or establish a relationship with a primary care physician if you do not have one) for your aftercare needs so that they can reassess your need for medications and monitor your lab values.   Today   CHIEF COMPLAINT:   Chief Complaint  Patient presents with  . Shortness of Breath    HISTORY OF PRESENT ILLNESS:  Wendy Ferrell is a 59 y.o. female with a known history of COPD for past 5 years continue nicotine addiction, who presented to the emergency department complaining of shortness of breath and cough ongoing for the past 3 weeks. She was seen as outpatient for the symptoms she was treated with a prednisone taper and Levaquin. Despite being treated with these things she did not improve she continued to have significant shortness of breath and wheezing. She has had a cough which is minimally productive. She has not had any fevers or chills has had chest pain with coughing. Denies any nausea vomiting or diarrhea.  VITAL SIGNS:  Blood pressure 150/53, pulse 76, temperature 97.7 F (36.5 C), temperature source Oral, resp. rate 16, height 5\' 4"  (1.626 m), weight 39.463 kg (87 lb), SpO2 95 %.  I/O:  No intake or output data in the 24 hours ending 02/25/15 1607  PHYSICAL EXAMINATION:  GENERAL: 59 y.o.-year-old patient lying in the bed with no acute distress. Thin EYES: Pupils equal, round, reactive to light and accommodation. No scleral icterus. Extraocular muscles intact.  HEENT: Head atraumatic, normocephalic. Oropharynx and nasopharynx clear.  NECK: Supple, no jugular venous distention. No thyroid enlargement, no tenderness.  LUNGS: Diffuse wheezes, coarse breath sounds, scattered rhonchi, fair air movement CARDIOVASCULAR: S1, S2 normal. No murmurs,  rubs, or gallops.  ABDOMEN: Soft, nontender, nondistended. Bowel sounds present. No organomegaly or mass.  EXTREMITIES: No pedal edema, cyanosis, or clubbing.  NEUROLOGIC: Cranial nerves II through XII are intact. Muscle strength 5/5 in all extremities. Sensation intact. Gait not checked.  PSYCHIATRIC: The patient is alert and oriented x 3.  SKIN: No obvious rash, lesion, or ulcer.    DATA REVIEW:   CBC No results for input(s): WBC, HGB, HCT, PLT in the last 168 hours.  Chemistries  No results for input(s): NA, K, CL, CO2, GLUCOSE, BUN, CREATININE, CALCIUM, MG, AST, ALT, ALKPHOS, BILITOT in the last 168 hours.  Invalid input(s): GFRCGP  Cardiac Enzymes No results for input(s): TROPONINI in the last 168 hours.  Microbiology Results  Results for orders placed or performed during the hospital encounter of 02/14/15  Culture, blood (routine x 2)     Status: None   Collection Time: 02/14/15  8:52 AM  Result Value Ref Range Status   Specimen Description BLOOD  Final   Special Requests Normal  Final   Culture NO GROWTH 5 DAYS  Final   Report Status 02/19/2015 FINAL  Final  Culture, blood (routine x 2)     Status: None   Collection Time:  02/14/15  8:53 AM  Result Value Ref Range Status   Specimen Description BLOOD  Final   Special Requests Normal  Final   Culture NO GROWTH 5 DAYS  Final   Report Status 02/19/2015 FINAL  Final    RADIOLOGY:  No results found.  EKG:   Orders placed or performed during the hospital encounter of 02/14/15  . ED EKG  . ED EKG  . ED EKG  . ED EKG  . EKG 12-Lead  . EKG 12-Lead  . EKG      Management plans discussed with the patient, family and they are in agreement.  CODE STATUS: full  TOTAL TIME TAKING CARE OF THIS PATIENT: 40 minutes.    Myrtis Ser M.D on 02/25/2015 at 4:07 PM  Between 7am to 6pm - Pager - [phone]  After 6pm go to [email or web address] - password [email or web address] [facility]  [place] [facility]  Office   [phone]  CC: Primary care physician; No PCP Per Patient

## 2015-12-01 ENCOUNTER — Other Ambulatory Visit: Payer: Self-pay | Admitting: Internal Medicine

## 2015-12-01 DIAGNOSIS — Z1231 Encounter for screening mammogram for malignant neoplasm of breast: Secondary | ICD-10-CM

## 2015-12-19 ENCOUNTER — Ambulatory Visit: Payer: BLUE CROSS/BLUE SHIELD

## 2016-01-02 ENCOUNTER — Ambulatory Visit
Admission: RE | Admit: 2016-01-02 | Discharge: 2016-01-02 | Disposition: A | Discharge status: Home / Self Care | Payer: Managed Care, Other (non HMO) | Source: Ambulatory Visit | Attending: Internal Medicine | Admitting: Internal Medicine

## 2016-01-02 DIAGNOSIS — Z1231 Encounter for screening mammogram for malignant neoplasm of breast: Secondary | ICD-10-CM | POA: Insufficient documentation

## 2016-02-21 ENCOUNTER — Encounter
Admission: RE | Disposition: A | Discharge status: Home / Self Care | Payer: Self-pay | Source: Ambulatory Visit | Attending: Gastroenterology

## 2016-02-21 ENCOUNTER — Ambulatory Visit
Admission: RE | Admit: 2016-02-21 | Discharge: 2016-02-21 | Disposition: A | Discharge status: Home / Self Care | Payer: Managed Care, Other (non HMO) | Source: Ambulatory Visit | Attending: Gastroenterology | Admitting: Gastroenterology

## 2016-02-21 ENCOUNTER — Ambulatory Visit: Payer: Managed Care, Other (non HMO) | Admitting: Anesthesiology

## 2016-02-21 ENCOUNTER — Ambulatory Visit: Admit: 2016-02-21 | Payer: Self-pay | Admitting: Gastroenterology

## 2016-02-21 DIAGNOSIS — J449 Chronic obstructive pulmonary disease, unspecified: Secondary | ICD-10-CM | POA: Insufficient documentation

## 2016-02-21 DIAGNOSIS — Z801 Family history of malignant neoplasm of trachea, bronchus and lung: Secondary | ICD-10-CM | POA: Diagnosis not present

## 2016-02-21 DIAGNOSIS — Z1211 Encounter for screening for malignant neoplasm of colon: Secondary | ICD-10-CM | POA: Diagnosis present

## 2016-02-21 DIAGNOSIS — F17210 Nicotine dependence, cigarettes, uncomplicated: Secondary | ICD-10-CM | POA: Diagnosis not present

## 2016-02-21 DIAGNOSIS — K5730 Diverticulosis of large intestine without perforation or abscess without bleeding: Secondary | ICD-10-CM | POA: Diagnosis not present

## 2016-02-21 DIAGNOSIS — K621 Rectal polyp: Secondary | ICD-10-CM | POA: Diagnosis not present

## 2016-02-21 DIAGNOSIS — R1310 Dysphagia, unspecified: Secondary | ICD-10-CM | POA: Insufficient documentation

## 2016-02-21 DIAGNOSIS — D120 Benign neoplasm of cecum: Secondary | ICD-10-CM | POA: Insufficient documentation

## 2016-02-21 DIAGNOSIS — Z79899 Other long term (current) drug therapy: Secondary | ICD-10-CM | POA: Insufficient documentation

## 2016-02-21 DIAGNOSIS — Q438 Other specified congenital malformations of intestine: Secondary | ICD-10-CM | POA: Insufficient documentation

## 2016-02-21 DIAGNOSIS — Z90710 Acquired absence of both cervix and uterus: Secondary | ICD-10-CM | POA: Diagnosis not present

## 2016-02-21 DIAGNOSIS — Z86010 Personal history of colonic polyps: Secondary | ICD-10-CM | POA: Diagnosis not present

## 2016-02-21 DIAGNOSIS — D123 Benign neoplasm of transverse colon: Secondary | ICD-10-CM | POA: Diagnosis not present

## 2016-02-21 DIAGNOSIS — Z825 Family history of asthma and other chronic lower respiratory diseases: Secondary | ICD-10-CM | POA: Insufficient documentation

## 2016-02-21 HISTORY — PX: COLONOSCOPY WITH PROPOFOL: SHX5780

## 2016-02-21 SURGERY — COLONOSCOPY WITH PROPOFOL
Anesthesia: General

## 2016-02-21 MED ORDER — SODIUM CHLORIDE 0.9 % IV SOLN
INTRAVENOUS | Status: DC
Start: 2016-02-21 — End: 2016-02-21
  Administered 2016-02-21: 1000 mL via INTRAVENOUS

## 2016-02-21 MED ORDER — MIDAZOLAM HCL 5 MG/5ML IJ SOLN
INTRAMUSCULAR | Status: DC | PRN
  Administered 2016-02-21: 1 mg via INTRAVENOUS

## 2016-02-21 MED ORDER — PROPOFOL 10 MG/ML IV BOLUS
INTRAVENOUS | Status: DC | PRN
  Administered 2016-02-21: 100 mg via INTRAVENOUS

## 2016-02-21 MED ORDER — SODIUM CHLORIDE 0.9 % IV SOLN: INTRAVENOUS | Status: DC

## 2016-02-21 MED ORDER — FENTANYL CITRATE (PF) 100 MCG/2ML IJ SOLN
INTRAMUSCULAR | Status: DC | PRN
  Administered 2016-02-21: 50 ug via INTRAVENOUS

## 2016-02-21 MED ORDER — LIDOCAINE 2% (20 MG/ML) 5 ML SYRINGE
INTRAMUSCULAR | Status: DC | PRN
  Administered 2016-02-21: 40 mg via INTRAVENOUS

## 2016-02-21 MED ORDER — PHENYLEPHRINE HCL 10 MG/ML IJ SOLN
INTRAMUSCULAR | Status: DC | PRN
  Administered 2016-02-21: 100 ug via INTRAVENOUS

## 2016-02-21 MED ORDER — PROPOFOL 500 MG/50ML IV EMUL
INTRAVENOUS | Status: DC | PRN
  Administered 2016-02-21: 140 ug/kg/min via INTRAVENOUS

## 2016-02-21 NOTE — Op Note (Signed)
[facility] Gastroenterology Patient Name: Wendy Ferrell Procedure Date: 02/21/2016 11:06 AM MRN: [id number] Account #: [id number] Date of Birth: [birth year] Admit Type: Outpatient Age: 60 Room: [facility] ENDO ROOM 3 Gender: Female Note Status: Finalized Procedure:            Colonoscopy Indications:          Personal history of colonic polyps Providers:            Lollie Sails, MD Referring MD:         Venetia Maxon. Elijio Miles, MD (Referring MD) Medicines:            Monitored Anesthesia Care Complications:        No immediate complications. Procedure:            Pre-Anesthesia Assessment:                       - ASA Grade Assessment: III - A patient with severe                        systemic disease.                       After obtaining informed consent, the colonoscope was                        passed under direct vision. Throughout the procedure,                        the patient's blood pressure, pulse, and oxygen                        saturations were monitored continuously. The                        Colonoscope was introduced through the anus and                        advanced to the the cecum, identified by appendiceal                        orifice and ileocecal valve. The colonoscopy was                        unusually difficult due to a tortuous colon. Successful                        completion of the procedure was aided by changing the                        patient to a supine position and using manual pressure.                        The patient tolerated the procedure well. The quality                        of the bowel preparation was good. Findings:      A 2 mm polyp was found in the hepatic flexure. The polyp was sessile.       The polyp was removed with a cold biopsy forceps. Resection and  retrieval were complete.      A 11 mm polyp was found in the cecum. The polyp was sessile. The polyp       was removed with a cold biopsy forceps.  The polyp was removed with a       cold snare. The polyp was removed with a lift and cut technique using a       cold snare. The polyp was removed with a piecemeal technique using a       cold snare. Resection and retrieval were complete.      Three sessile polyps were found in the rectum. The polyps were 1 to 3 mm       in size. These polyps were removed with a cold biopsy forceps. Resection       and retrieval were complete.      A 2 mm polyp was found in the rectum. The polyp was sessile. The polyp       was removed with a cold biopsy forceps. Resection and retrieval were       complete.      The digital rectal exam was normal.      Multiple small-mouthed diverticula were found in the sigmoid colon and       descending colon.      The sigmoid colon was significantly redundant. Impression:           - One 2 mm polyp at the hepatic flexure, removed with a                        cold biopsy forceps. Resected and retrieved.                       - One 11 mm polyp in the cecum, removed with a cold                        snare, removed using lift and cut and a cold snare,                        removed piecemeal using a cold snare and removed with a                        cold biopsy forceps. Resected and retrieved.                       - Three 1 to 3 mm polyps in the rectum, removed with a                        cold biopsy forceps. Resected and retrieved.                       - One 2 mm polyp in the rectum, removed with a cold                        biopsy forceps. Resected and retrieved.                       - Diverticulosis in the sigmoid colon and in the                        descending colon.                       -  Redundant colon. Recommendation:       - Discharge patient to home. Procedure Code(s):    --- Professional ---                       [phone], Colonoscopy, flexible; with removal of tumor(s),                        polyp(s), or other lesion(s) by snare technique                        [id number], 73, Colonoscopy, flexible; with biopsy, single                        or multiple Diagnosis Code(s):    --- Professional ---                       D12.3, Benign neoplasm of transverse colon (hepatic                        flexure or splenic flexure)                       D12.0, Benign neoplasm of cecum                       K62.1, Rectal polyp                       Z86.010, Personal history of colonic polyps                       K57.30, Diverticulosis of large intestine without                        perforation or abscess without bleeding                       Q43.8, Other specified congenital malformations of                        intestine CPT copyright 2016 American Medical Association. All rights reserved. The codes documented in this report are preliminary and upon coder review may  be revised to meet current compliance requirements. Lollie Sails, MD 02/21/2016 11:55:49 AM This report has been signed electronically. Number of Addenda: 0 Note Initiated On: 02/21/2016 11:06 AM Scope Withdrawal Time: 0 hours 16 minutes 32 seconds  Total Procedure Duration: 0 hours 31 minutes 23 seconds       [facility]

## 2016-02-21 NOTE — Transfer of Care (Signed)
Immediate Anesthesia Transfer of Care Note  Patient: Wendy Ferrell  Procedure(s) Performed: Procedure(s): COLONOSCOPY WITH PROPOFOL (N/A)  Patient Location: PACU and Endoscopy Unit  Anesthesia Type:General  Level of Consciousness: sedated and patient cooperative  Airway & Oxygen Therapy: Patient Spontanous Breathing and Patient connected to nasal cannula oxygen  Post-op Assessment: Report given to RN and Post -op Vital signs reviewed and stable  Post vital signs: Reviewed and stable  Last Vitals:  Filed Vitals:   02/21/16 1036  BP: 133/59  Pulse: 83  Temp: 35.4 C  Resp: 16    Last Pain:  Filed Vitals:   02/21/16 1040  PainSc: 7          Complications: No apparent anesthesia complications

## 2016-02-21 NOTE — H&P (Signed)
Outpatient short stay form Pre-procedure 02/21/2016 11:12 AM Wendy Sails MD  Primary Physician: Dr Elijio Miles  Reason for visit:  Colonoscopy  History of present illness:  Patient is a 60 year old female presenting today for colonoscopy. She has personal history of adenomatous colon polyps with her last colonoscopy being done about 3 years ago by Dr. Candace Cruise. There is also a strong family history: Polyps.    Current facility-administered medications:  .  0.9 %  sodium chloride infusion, , Intravenous, Continuous, Wendy Sails, MD, Last Rate: 20 mL/hr at 02/21/16 1059, 1,000 mL at 02/21/16 1059 .  0.9 %  sodium chloride infusion, , Intravenous, Continuous, Wendy Sails, MD  Facility-Administered Medications Ordered in Other Encounters:  .  fentaNYL (SUBLIMAZE) injection, , Intravenous, Anesthesia Intra-op, Courtney Paris, CRNA, 50 mcg at 02/21/16 1108 .  lidocaine 2% (20 mg/mL) 5 mL syringe, , Intravenous, Anesthesia Intra-op, Courtney Paris, CRNA, 40 mg at 02/21/16 1100 .  midazolam (VERSED) 5 MG/5ML injection, , Intravenous, Anesthesia Intra-op, Courtney Paris, CRNA, 1 mg at 02/21/16 1108  Prescriptions prior to admission  Medication Sig Dispense Refill Last Dose  . albuterol (PROVENTIL HFA;VENTOLIN HFA) 108 (90 BASE) MCG/ACT inhaler Inhale 2 puffs into the lungs every 6 (six) hours as needed for wheezing or shortness of breath.   02/20/2016 at Unknown time  . benzonatate (TESSALON) 200 MG capsule Take 200 mg by mouth 3 (three) times daily as needed for cough.   02/20/2016 at Unknown time  . ibuprofen (ADVIL,MOTRIN) 200 MG tablet Take 200 mg by mouth every 6 (six) hours as needed for moderate pain.   02/20/2016 at Unknown time  . mometasone-formoterol (DULERA) 100-5 MCG/ACT AERO Inhale 2 puffs into the lungs 2 (two) times daily. 1 Inhaler 1 02/20/2016 at Unknown time  . predniSONE (DELTASONE) 20 MG tablet Take 2 tablets (40 mg total) by mouth daily with breakfast. 10 tablet 0  02/20/2016 at Unknown time  . tiotropium (SPIRIVA) 18 MCG inhalation capsule Place 1 capsule (18 mcg total) into inhaler and inhale daily. 30 capsule 12 02/20/2016 at Unknown time  . moxifloxacin (AVELOX) 400 MG tablet Take 1 tablet (400 mg total) by mouth daily at 8 pm. (Patient not taking: Reported on 02/21/2016) 5 tablet 0 Not Taking at Unknown time     No Known Allergies   Past Medical History  Diagnosis Date  . COPD (chronic obstructive pulmonary disease) ([place])   . Nicotine addiction     Review of systems:      Physical Exam    Heart and lungs: Regular rate and rhythm without rub or gallop, lungs are bilaterally clear.    HEENT: Normocephalic atraumatic eyes are anicteric    Other:     Pertinant exam for procedure: Soft nontender nondistended bowel sounds positive normoactive.    Planned proceedures: Colonoscopy and indicated procedures. I have discussed the risks benefits and complications of procedures to include not limited to bleeding, infection, perforation and the risk of sedation and the patient wishes to proceed.    Wendy Sails, MD Gastroenterology 02/21/2016  11:12 AM

## 2016-02-21 NOTE — Anesthesia Preprocedure Evaluation (Addendum)
Anesthesia Evaluation  Patient identified by MRN, date of birth, ID band Patient awake    Reviewed: Allergy & Precautions, NPO status , Patient's Chart, lab work & pertinent test results  History of Anesthesia Complications Negative for: history of anesthetic complications  Airway Mallampati: II       Dental  (+) Missing, Poor Dentition, Chipped   Pulmonary neg pulmonary ROS, COPD,  COPD inhaler, former smoker,           Cardiovascular negative cardio ROS       Neuro/Psych negative neurological ROS     GI/Hepatic negative GI ROS, Neg liver ROS,   Endo/Other  negative endocrine ROS  Renal/GU negative Renal ROS     Musculoskeletal negative musculoskeletal ROS (+)   Abdominal   Peds  Hematology negative hematology ROS (+)   Anesthesia Other Findings   Reproductive/Obstetrics                            Anesthesia Physical Anesthesia Plan  ASA: III and emergent  Anesthesia Plan: General   Post-op Pain Management:    Induction: Intravenous  Airway Management Planned: Nasal Cannula  Additional Equipment:   Intra-op Plan:   Post-operative Plan:   Informed Consent: I have reviewed the patients History and Physical, chart, labs and discussed the procedure including the risks, benefits and alternatives for the proposed anesthesia with the patient or authorized representative who has indicated his/her understanding and acceptance.     Plan Discussed with:   Anesthesia Plan Comments:         Anesthesia Quick Evaluation

## 2016-02-21 NOTE — Anesthesia Postprocedure Evaluation (Signed)
Anesthesia Post Note  Patient: Wendy Ferrell  Procedure(s) Performed: Procedure(s) (LRB): COLONOSCOPY WITH PROPOFOL (N/A)  Patient location during evaluation: Endoscopy Anesthesia Type: General Level of consciousness: awake and alert Pain management: pain level controlled Vital Signs Assessment: post-procedure vital signs reviewed and stable Respiratory status: spontaneous breathing and respiratory function stable Cardiovascular status: stable Anesthetic complications: no    Last Vitals:  Filed Vitals:   02/21/16 1210 02/21/16 1220  BP: 117/84 139/73  Pulse: 82 69  Temp:    Resp: 21 13    Last Pain:  Filed Vitals:   02/21/16 1223  PainSc: 7                  KEPHART,WILLIAM K

## 2016-02-22 ENCOUNTER — Encounter: Payer: Self-pay | Admitting: Gastroenterology

## 2016-02-22 LAB — THYROID PANEL WITH TSH
Free Thyroxine Index: 2.4 (ref 1.2–4.9)
T3 Uptake Ratio: 26 % (ref 24–39)
T4, Total: 9.4 ug/dL (ref 4.5–12.0)
TSH: 0.692 u[IU]/mL (ref 0.450–4.500)

## 2016-02-22 LAB — SURGICAL PATHOLOGY

## 2016-05-16 ENCOUNTER — Other Ambulatory Visit: Payer: Self-pay | Admitting: Internal Medicine

## 2016-05-16 DIAGNOSIS — M25511 Pain in right shoulder: Secondary | ICD-10-CM

## 2016-05-23 ENCOUNTER — Other Ambulatory Visit: Payer: Self-pay | Admitting: Vascular Surgery

## 2016-05-25 ENCOUNTER — Ambulatory Visit
Admission: RE | Admit: 2016-05-25 | Discharge: 2016-05-25 | Disposition: A | Discharge status: Home / Self Care | Payer: Managed Care, Other (non HMO) | Source: Ambulatory Visit | Attending: Internal Medicine | Admitting: Internal Medicine

## 2016-05-25 DIAGNOSIS — M75101 Unspecified rotator cuff tear or rupture of right shoulder, not specified as traumatic: Secondary | ICD-10-CM | POA: Insufficient documentation

## 2016-05-25 DIAGNOSIS — M25511 Pain in right shoulder: Secondary | ICD-10-CM | POA: Insufficient documentation

## 2016-05-25 DIAGNOSIS — S43431A Superior glenoid labrum lesion of right shoulder, initial encounter: Secondary | ICD-10-CM | POA: Diagnosis not present

## 2016-05-25 DIAGNOSIS — X58XXXA Exposure to other specified factors, initial encounter: Secondary | ICD-10-CM | POA: Diagnosis not present

## 2016-05-28 ENCOUNTER — Other Ambulatory Visit
Admission: RE | Admit: 2016-05-28 | Discharge: 2016-05-28 | Disposition: A | Discharge status: Home / Self Care | Payer: Managed Care, Other (non HMO) | Source: Ambulatory Visit | Attending: Vascular Surgery | Admitting: Vascular Surgery

## 2016-05-28 DIAGNOSIS — Z01812 Encounter for preprocedural laboratory examination: Secondary | ICD-10-CM | POA: Diagnosis not present

## 2016-05-28 DIAGNOSIS — I739 Peripheral vascular disease, unspecified: Secondary | ICD-10-CM | POA: Insufficient documentation

## 2016-05-28 LAB — CREATININE, SERUM: Creatinine, Ser: 0.92 mg/dL (ref 0.44–1.00)

## 2016-05-28 LAB — BUN: BUN: 16 mg/dL (ref 6–20)

## 2016-05-29 ENCOUNTER — Encounter: Payer: Self-pay | Admitting: Certified Registered Nurse Anesthetist

## 2016-05-29 ENCOUNTER — Encounter
Admission: RE | Disposition: A | Discharge status: Home / Self Care | Payer: Self-pay | Source: Ambulatory Visit | Attending: Vascular Surgery

## 2016-05-29 ENCOUNTER — Ambulatory Visit
Admission: RE | Admit: 2016-05-29 | Discharge: 2016-05-29 | Disposition: A | Discharge status: Home / Self Care | Payer: Managed Care, Other (non HMO) | Source: Ambulatory Visit | Attending: Vascular Surgery | Admitting: Vascular Surgery

## 2016-05-29 DIAGNOSIS — E785 Hyperlipidemia, unspecified: Secondary | ICD-10-CM | POA: Insufficient documentation

## 2016-05-29 DIAGNOSIS — Z7982 Long term (current) use of aspirin: Secondary | ICD-10-CM | POA: Diagnosis not present

## 2016-05-29 DIAGNOSIS — I10 Essential (primary) hypertension: Secondary | ICD-10-CM | POA: Insufficient documentation

## 2016-05-29 DIAGNOSIS — I701 Atherosclerosis of renal artery: Secondary | ICD-10-CM | POA: Diagnosis not present

## 2016-05-29 DIAGNOSIS — Z90710 Acquired absence of both cervix and uterus: Secondary | ICD-10-CM | POA: Insufficient documentation

## 2016-05-29 DIAGNOSIS — Z87891 Personal history of nicotine dependence: Secondary | ICD-10-CM | POA: Diagnosis not present

## 2016-05-29 DIAGNOSIS — M79609 Pain in unspecified limb: Secondary | ICD-10-CM | POA: Insufficient documentation

## 2016-05-29 DIAGNOSIS — I70213 Atherosclerosis of native arteries of extremities with intermittent claudication, bilateral legs: Secondary | ICD-10-CM | POA: Diagnosis present

## 2016-05-29 DIAGNOSIS — I890 Lymphedema, not elsewhere classified: Secondary | ICD-10-CM | POA: Diagnosis not present

## 2016-05-29 DIAGNOSIS — Z8249 Family history of ischemic heart disease and other diseases of the circulatory system: Secondary | ICD-10-CM | POA: Diagnosis not present

## 2016-05-29 HISTORY — DX: Peripheral vascular disease, unspecified: I73.9

## 2016-05-29 HISTORY — PX: PERIPHERAL VASCULAR CATHETERIZATION: SHX172C

## 2016-05-29 HISTORY — DX: Essential (primary) hypertension: I10

## 2016-05-29 SURGERY — LOWER EXTREMITY ANGIOGRAPHY
Anesthesia: Moderate Sedation | Site: Leg Lower | Laterality: Left

## 2016-05-29 MED ORDER — LIDOCAINE HCL (PF) 1 % IJ SOLN
INTRAMUSCULAR | Status: AC
  Filled 2016-05-29: qty 30

## 2016-05-29 MED ORDER — METHYLPREDNISOLONE SODIUM SUCC 125 MG IJ SOLR: 125.0000 mg | INTRAMUSCULAR | Status: DC | PRN

## 2016-05-29 MED ORDER — HEPARIN SODIUM (PORCINE) 1000 UNIT/ML IJ SOLN
INTRAMUSCULAR | Status: DC | PRN
  Administered 2016-05-29: 4000 [IU] via INTRAVENOUS

## 2016-05-29 MED ORDER — MIDAZOLAM HCL 5 MG/5ML IJ SOLN
INTRAMUSCULAR | Status: AC
  Filled 2016-05-29: qty 5

## 2016-05-29 MED ORDER — FENTANYL CITRATE (PF) 100 MCG/2ML IJ SOLN
INTRAMUSCULAR | Status: AC
  Filled 2016-05-29: qty 4

## 2016-05-29 MED ORDER — HEPARIN (PORCINE) IN NACL 2-0.9 UNIT/ML-% IJ SOLN
INTRAMUSCULAR | Status: AC
  Filled 2016-05-29: qty 1000

## 2016-05-29 MED ORDER — HYDROMORPHONE HCL 1 MG/ML IJ SOLN: 1.0000 mg | Freq: Once | INTRAMUSCULAR | Status: DC

## 2016-05-29 MED ORDER — SODIUM CHLORIDE 0.9 % IV SOLN
INTRAVENOUS | Status: DC
  Administered 2016-05-29: 08:00:00 via INTRAVENOUS

## 2016-05-29 MED ORDER — MIDAZOLAM HCL 2 MG/2ML IJ SOLN
INTRAMUSCULAR | Status: DC | PRN
Start: 2016-05-29 — End: 2016-05-29
  Administered 2016-05-29: 0.5 mg via INTRAVENOUS
  Administered 2016-05-29 (×2): 1 mg via INTRAVENOUS
  Administered 2016-05-29: 2 mg via INTRAVENOUS

## 2016-05-29 MED ORDER — FAMOTIDINE 20 MG PO TABS: 40.0000 mg | ORAL_TABLET | ORAL | Status: DC | PRN

## 2016-05-29 MED ORDER — FENTANYL CITRATE (PF) 100 MCG/2ML IJ SOLN
INTRAMUSCULAR | Status: DC | PRN
  Administered 2016-05-29: 25 ug via INTRAVENOUS
  Administered 2016-05-29 (×3): 50 ug via INTRAVENOUS

## 2016-05-29 MED ORDER — DEXTROSE 5 % IV SOLN
1.5000 g | INTRAVENOUS | Status: AC
  Administered 2016-05-29: 1.5 g via INTRAVENOUS

## 2016-05-29 MED ORDER — HEPARIN SODIUM (PORCINE) 1000 UNIT/ML IJ SOLN
INTRAMUSCULAR | Status: AC
  Filled 2016-05-29: qty 1

## 2016-05-29 MED ORDER — SODIUM CHLORIDE FLUSH 0.9 % IV SOLN
INTRAVENOUS | Status: AC
  Filled 2016-05-29: qty 30

## 2016-05-29 MED ORDER — ONDANSETRON HCL 4 MG/2ML IJ SOLN: 4.0000 mg | Freq: Four times a day (QID) | INTRAMUSCULAR | Status: DC | PRN

## 2016-05-29 SURGICAL SUPPLY — 25 items
BALLN LUTONIX DCB 7X40X130 (BALLOONS) ×4
BALLN LUTONIX DCB 7X60X130 (BALLOONS) ×4
BALLN ULTRVRSE 8X40X75C (BALLOONS) ×4
BALLOON LUTONIX DCB 7X40X130 (BALLOONS) IMPLANT
BALLOON LUTONIX DCB 7X60X130 (BALLOONS) IMPLANT
BALLOON ULTRVRSE 8X40X75C (BALLOONS) IMPLANT
CATH PIG 70CM (CATHETERS) ×4 IMPLANT
CATH RIM 65CM (CATHETERS) ×2 IMPLANT
CATH TORCON 5FR 0.38 (CATHETERS) ×2 IMPLANT
DEVICE CLOSURE MYNXGRIP 6/7F (Vascular Products) ×4 IMPLANT
DEVICE PRESTO INFLATION (MISCELLANEOUS) ×4 IMPLANT
DEVICE TORQUE (MISCELLANEOUS) ×2 IMPLANT
GLIDEWIRE STIFF .35X180X3 HYDR (WIRE) ×2 IMPLANT
PACK ANGIOGRAPHY (CUSTOM PROCEDURE TRAY) ×4 IMPLANT
SET INTRO CAPELLA COAXIAL (SET/KITS/TRAYS/PACK) ×6 IMPLANT
SHEATH BRITE TIP 5FRX11 (SHEATH) ×4 IMPLANT
SHEATH BRITE TIP 6FRX11 (SHEATH) ×4 IMPLANT
SHEATH BRITE TIP 7FRX11 (SHEATH) ×4 IMPLANT
STENT LIFESTREAM 7X26X80 (Permanent Stent) ×2 IMPLANT
STENT LIFESTREAM 7X37X80 (Permanent Stent) ×4 IMPLANT
STENT LIFESTREAM 7X58X80 (Permanent Stent) ×2 IMPLANT
SYR MEDRAD MARK V 150ML (SYRINGE) ×4 IMPLANT
TUBING CONTRAST HIGH PRESS 72 (TUBING) ×4 IMPLANT
WIRE J 3MM .035X145CM (WIRE) ×4 IMPLANT
WIRE MAGIC TORQUE 260C (WIRE) ×4 IMPLANT

## 2016-05-29 NOTE — Progress Notes (Signed)
MD in to see patient and family.

## 2016-05-29 NOTE — Op Note (Signed)
[facility]  Percutaneous Study/Intervention Procedural Note   Date of Surgery: 05/29/2016  Surgeon:Schnier, Dolores Lory   Pre-operative Diagnosis: Atherosclerotic occlusive disease bilateral lower extremities with mild rest pain of the left lower extremity and lifestyle limiting claudication  Post-operative diagnosis:  Same  Procedure(s) Performed:  1.  Abdominal aortogram; Bilateral distal runoff  2.  Introduction catheter into aorta right and left femoral approaches  3.  Percutaneous transluminal angioplasty and stent placement right common iliac artery; "kissing balloon" technique  4.  Percutaneous transluminal and plasty and stent placement left common iliac artery; "kissing balloon" technique  5.  Ultrasound guided access bilateral common femoral arteries  6.  Mynx closure device bilateral common femoral arteries  Anesthesia: Conscious sedation was administered under my direct supervision. IV Versed plus fentanyl were utilized. Continuous ECG, pulse oximetry and blood pressure was monitored throughout the entire procedure. Conscious sedation was for a total of 1 hour 40 minutes.  Sheath: 7 French sheath right common femoral artery retrograde, 7 French sheath left common femoral artery retrograde  Contrast: 145  Fluoroscopy Time: 8.9 minutes  Indications:  The patient presented to the office with increasing pain in her left lower extremity. Physical examination as well as noninvasive studies demonstrated severe atherosclerotic occlusive disease her left ABI was 0.60. The risks and benefits for angiography intervention were reviewed all questions were answered patient agrees to proceed with angiography with the intention for intervention.  Procedure:  Wendy Bruggeman Parkeris a 60 y.o. female who was identified and appropriate procedural time out was performed.  The patient was then placed supine on the table and prepped and draped in the usual sterile fashion.   Ultrasound was used to evaluate the right common femoral artery.  It was patent .  A digital ultrasound image was acquired.  A Seldinger needle was used to access the right common femoral artery under direct ultrasound guidance and a permanent image was performed.  A 0.035 J wire was advanced without resistance and a 5Fr sheath was placed.    The pigtail catheter was then positioned at the level of T12 and an AP image of the aorta was obtained. After review the images the pigtail catheter was repositioned above the aortic bifurcation and bilateral oblique views of the pelvis were obtained. Subsequently the detector was returned to the AP position and bilateral lower extremity runoff was obtained.  After review the images the ultrasound was reprepped and delivered back onto the sterile field. The left common femoral was then imaged with the ultrasound it was noted to be echolucent and pulsatile indicating patency. Images recorded for the permanent record. Under real-time visualization a microneedle was inserted into the anterior wall the common femoral artery microwire was then advanced without difficulty under fluoroscopic guidance followed by placement of the micro-sheath.  A stiff angled glide wire was then negotiated under fluoroscopic guidance into the aorta. 6 French sheath was then placed.  4000 Units of heparin was given and allowed to circulate for proximally 4 minutes.  The right sheath was then upsized to a 6 [place] sheath and later a 7 French sheath and the left side as well after a Magic torque wire was advanced through the pigtail catheter. Magnified images of the aortic bifurcation were then made using hand injection contrast from the femoral sheaths. After appropriate sizing a 7 x 37 Lifestream stent and then a 7 x 58 Lifestream stent was selected for the left and a 7 x 26 Lifestream stent and  then a 7 x 37 Lifestream stent was selected for the right. There were then advanced and positioned  just above the aortic bifurcation. Insufflation for full expansion of the stents was performed simultaneously. Follow-up imaging was then performed and apparent occlusion of the distal left external or common femoral on the left was noted. Subsequently, a rim catheter was advanced up the right side and used to hook the left stent for a more directed injection. Under magnified oblique views images were obtained after a minx device was deployed on the left. These images demonstrated the minx had adequately sealed the puncture site. There were no focal hemodynamically significant stenoses noted nor were there any evidence of thrombus or embolic material from the more proximal iliac lesion..  The pigtail catheter was then introduced up the right and bolus injection of contrast was used to perform final imaging of the distal aortic reconstruction.  Oblique views were then obtained of the groins in succession and minx device is deployed without difficulty. There were no immediate complications   Findings:   Aortogram:  The abdominal aorta is opacified with a bolus injection contrast. Demonstrates diffuse disease but there are no hemodynamically significant lesions noted until the distal aortic bifurcation where the left common iliac is occluded there is mild to moderate disease in the first few centimeters of the right common iliac. There is moderate poststenotic dilatation noted of the common iliac arteries as well.  Right Lower Extremity:  The common femoral profunda femoris superficial femoral and popliteal arteries are patent. They're quite small there is diffuse disease but no focal hemodynamically significant lesions trifurcation is patent with all 3 vessels noted.  Left Lower Extremity:  The common femoral profunda femoris superficial femoral and popliteal arteries are patent. They're quite small there is diffuse disease but no focal hemodynamically significant lesions trifurcation is patent with all 3  vessels noted.  Following placement of the iliac stents there is now wide patency with less than 10% residual stenosis with rapid flow through the aortic bifurcation bilaterally.  Summary:  Successful reconstruction of the distal aorta and bilateral iliac arteries with recanalization of an occluded left common iliac. Of note, the overall diameter of her arteries is quite small with the 7 French sheath representing a subtotal occlusion on the left and right at the level of the distal external iliac proximal common femoral arteries  Disposition: Patient was taken to the recovery room in stable condition having tolerated the procedure well.  Schnier, Dolores Lory 05/29/2016,11:08 AM

## 2016-05-29 NOTE — H&P (Signed)
[facility] History & Physical Update  The patient was interviewed and re-examined.  The patient's previous History and Physical has been reviewed and is unchanged.  There is no change in the plan of care. We plan to proceed with the scheduled procedure.  Schnier, Dolores Lory, MD  05/29/2016, 9:28 AM

## 2016-06-04 DIAGNOSIS — M25519 Pain in unspecified shoulder: Secondary | ICD-10-CM | POA: Insufficient documentation

## 2016-06-13 ENCOUNTER — Other Ambulatory Visit: Payer: Self-pay | Admitting: Orthopedic Surgery

## 2016-06-13 DIAGNOSIS — M5412 Radiculopathy, cervical region: Secondary | ICD-10-CM

## 2016-06-18 ENCOUNTER — Other Ambulatory Visit (INDEPENDENT_AMBULATORY_CARE_PROVIDER_SITE_OTHER): Payer: Self-pay | Admitting: Vascular Surgery

## 2016-06-18 DIAGNOSIS — I70222 Atherosclerosis of native arteries of extremities with rest pain, left leg: Secondary | ICD-10-CM

## 2016-06-18 DIAGNOSIS — I70212 Atherosclerosis of native arteries of extremities with intermittent claudication, left leg: Secondary | ICD-10-CM

## 2016-06-19 ENCOUNTER — Ambulatory Visit (INDEPENDENT_AMBULATORY_CARE_PROVIDER_SITE_OTHER): Payer: Managed Care, Other (non HMO) | Admitting: Vascular Surgery

## 2016-06-19 ENCOUNTER — Encounter (INDEPENDENT_AMBULATORY_CARE_PROVIDER_SITE_OTHER): Payer: Self-pay | Admitting: Vascular Surgery

## 2016-06-19 ENCOUNTER — Ambulatory Visit (INDEPENDENT_AMBULATORY_CARE_PROVIDER_SITE_OTHER): Payer: Managed Care, Other (non HMO)

## 2016-06-19 VITALS — BP 134/69 | HR 72 | Resp 16 | Ht 64.0 in | Wt 100.0 lb

## 2016-06-19 DIAGNOSIS — I70219 Atherosclerosis of native arteries of extremities with intermittent claudication, unspecified extremity: Secondary | ICD-10-CM

## 2016-06-19 DIAGNOSIS — E785 Hyperlipidemia, unspecified: Secondary | ICD-10-CM

## 2016-06-19 DIAGNOSIS — F17219 Nicotine dependence, cigarettes, with unspecified nicotine-induced disorders: Secondary | ICD-10-CM

## 2016-06-19 DIAGNOSIS — I70212 Atherosclerosis of native arteries of extremities with intermittent claudication, left leg: Secondary | ICD-10-CM

## 2016-06-19 DIAGNOSIS — I739 Peripheral vascular disease, unspecified: Secondary | ICD-10-CM | POA: Diagnosis not present

## 2016-06-19 DIAGNOSIS — I70222 Atherosclerosis of native arteries of extremities with rest pain, left leg: Secondary | ICD-10-CM

## 2016-06-19 NOTE — Progress Notes (Signed)
Subjective:    Patient ID: Wendy Ferrell, female    DOB: Feb 18, 1956, 59 y.o.   MRN: [id number] Chief Complaint  Patient presents with  . Re-evaluation    Ultrasound follow up    Patient presents for her first post-procedure follow up. She is s/p stent placement into her bilateral iliac arteries on 05/29/16. She is without complaint. States her post-procedure course has been uneventful. Her claudication symptoms have resolved. The patient underwent an ABI which showed Right ABI: 0.95 and Left 1.01 (on 04/30/16, Right ABI: 0.94 and Left 0.60). The patient denies any claudication like symptoms, rest pain or ulcers to her feet / toes.  The patient continues a regimen of ASA, Zontivity and dyslipidemia medication.  The patient continues to smoke tobacco.  The patient denies any new health changes since last visit. Medication list reviewed.     Review of Systems  Constitutional: Negative.   HENT: Negative.   Eyes: Negative.   Respiratory: Negative.   Cardiovascular: Negative.   Gastrointestinal: Negative.   Endocrine: Negative.   Genitourinary: Negative.   Musculoskeletal: Negative.   Skin: Negative.   Allergic/Immunologic: Negative.   Neurological: Negative.   Hematological: Negative.   Psychiatric/Behavioral: Negative.        Objective:   Physical Exam  Constitutional: She is oriented to person, place, and time. She appears well-developed and well-nourished.  HENT:  Head: Normocephalic and atraumatic.  Eyes: Conjunctivae and EOM are normal. Pupils are equal, round, and reactive to light.  Neck: Normal range of motion.  Cardiovascular: Normal rate, normal heart sounds and intact distal pulses.   Pulses:      Dorsalis pedis pulses are 2+ on the right side, and 2+ on the left side.       Posterior tibial pulses are 2+ on the right side, and 2+ on the left side.  Pulmonary/Chest: Effort normal and breath sounds normal.  Abdominal: Soft. Bowel sounds are normal.    Musculoskeletal: Normal range of motion.  Neurological: She is alert and oriented to person, place, and time.  Skin: Skin is warm and dry.  Psychiatric: She has a normal mood and affect. Her behavior is normal. Judgment and thought content normal.   BP 134/69 (BP Location: Right Arm)   Pulse 72   Resp 16   Ht 5\' 4"  (1.626 m)   Wt 100 lb (45.4 kg)   BMI 17.16 kg/m   Past Medical History:  Diagnosis Date  . COPD (chronic obstructive pulmonary disease) ([place])   . Hypertension   . Nicotine addiction   . Peripheral vascular disease [facility])    Social History   Social History  . Marital status: Legally Separated    Spouse name: N/A  . Number of children: N/A  . Years of education: N/A   Occupational History  . Not on file.   Social History Main Topics  . Smoking status: Current Some Day Smoker    Packs/day: 0.50    Years: 45.00    Types: Cigarettes  . Smokeless tobacco: Never Used  . Alcohol use No  . Drug use: No  . Sexual activity: Not on file   Other Topics Concern  . Not on file   Social History Narrative  . No narrative on file   Past Surgical History:  Procedure Laterality Date  . ABDOMINAL HYSTERECTOMY    . COLONOSCOPY WITH PROPOFOL N/A 02/21/2016   Procedure: COLONOSCOPY WITH PROPOFOL;  Surgeon: Lollie Sails, MD;  Location: [facility] ENDOSCOPY;  Service:  Endoscopy;  Laterality: N/A;  . MASTOIDECTOMY    . NECK SURGERY    . PERIPHERAL VASCULAR CATHETERIZATION Left 05/29/2016   Procedure: Lower Extremity Angiography;  Surgeon: Katha Cabal, MD;  Location: [place] [facility];  Service: Cardiovascular;  Laterality: Left;  . PERIPHERAL VASCULAR CATHETERIZATION  05/29/2016   Procedure: Lower Extremity Intervention;  Surgeon: Katha Cabal, MD;  Location: [place] [facility];  Service: Cardiovascular;;  . TONSILLECTOMY     Family History  Problem Relation Age of Onset  . COPD Mother   . Hypertension Mother   . COPD Father   . Breast cancer Maternal  Grandmother 60  . Breast cancer Cousin 50    maternal side   No Known Allergies     Assessment & Plan:  Patient presents for her first post-procedure follow up. She is s/p stent placement into her bilateral iliac arteries on 05/29/16. She is without complaint. States her post-procedure course has been uneventful. Her claudication symptoms have resolved. The patient underwent an ABI which showed Right ABI: 0.95 and Left 1.01 (on 04/30/16, Right ABI: 0.94 and Left 0.60). The patient denies any claudication like symptoms, rest pain or ulcers to her feet / toes.  The patient continues a regimen of ASA, Zontivity and dyslipidemia medication.  The patient continues to smoke tobacco.  The patient denies any new health changes since last visit. Medication list reviewed.  1. PAD (peripheral artery disease) (HCC) - Improvement Improvement s/p intervention. No additional intervention indicated at this time. Continue Zontivity to protect stents.   2. Hyperlipidemia, unspecified hyperlipidemia type On ASA and statin for medical optimization. Encouraged good control as its slows the progression of atherosclerotic disease  3. Cigarette nicotine dependence with nicotine-induced disorder I have discussed (>20 minutes) with the patient the role of tobacco in the pathogenesis of atherosclerosis and its effect on the progression of the disease, impact on the durability of interventions and its limitations on the formation of collateral pathways. I have recommended absolute tobacco cessation. I have discussed various options available for assistance with tobacco cessation including over the counter methods (Nicotine gum, patch and lozenges). We also discussed prescription options (Chantix, Nicotine Inhaler / Nasal Spray). The patient is not interested in pursuing any prescription tobacco cessation options at this time. The patient voices their understanding.   Current Outpatient Prescriptions on File Prior to Visit    Medication Sig Dispense Refill  . aspirin 81 MG chewable tablet Chew by mouth daily.    Marland Kitchen atorvastatin (LIPITOR) 40 MG tablet Take 40 mg by mouth daily.    . benzonatate (TESSALON) 200 MG capsule Take 200 mg by mouth 3 (three) times daily as needed for cough.    . celecoxib (CELEBREX) 200 MG capsule Take 200 mg by mouth 2 (two) times daily.    . cyclobenzaprine (FLEXERIL) 5 MG tablet Take 5 mg by mouth 3 (three) times daily as needed for muscle spasms.    Marland Kitchen ibuprofen (ADVIL,MOTRIN) 200 MG tablet Take 200 mg by mouth every 6 (six) hours as needed for moderate pain.    . mometasone-formoterol (DULERA) 100-5 MCG/ACT AERO Inhale 2 puffs into the lungs 2 (two) times daily. 1 Inhaler 1  . moxifloxacin (AVELOX) 400 MG tablet Take 1 tablet (400 mg total) by mouth daily at 8 pm. 5 tablet 0  . pantoprazole (PROTONIX) 40 MG tablet Take 40 mg by mouth daily.    Marland Kitchen albuterol (PROVENTIL HFA;VENTOLIN HFA) 108 (90 BASE) MCG/ACT inhaler Inhale 2 puffs  into the lungs every 6 (six) hours as needed for wheezing or shortness of breath.    . predniSONE (DELTASONE) 20 MG tablet Take 2 tablets (40 mg total) by mouth daily with breakfast. (Patient not taking: Reported on 06/19/2016) 10 tablet 0  . tiotropium (SPIRIVA) 18 MCG inhalation capsule Place 1 capsule (18 mcg total) into inhaler and inhale daily. (Patient not taking: Reported on 06/19/2016) 30 capsule 12   No current facility-administered medications on file prior to visit.     There are no Patient Instructions on file for this visit. Return in about 3 months (around 09/19/2016) for ABI & LLE Arterial Duplex.   Bartholomew Ramesh A Shivaun Bilello, PA-C

## 2016-06-26 ENCOUNTER — Ambulatory Visit: Admission: RE | Admit: 2016-06-26 | Payer: Managed Care, Other (non HMO) | Source: Ambulatory Visit

## 2016-07-03 ENCOUNTER — Ambulatory Visit (INDEPENDENT_AMBULATORY_CARE_PROVIDER_SITE_OTHER): Payer: Managed Care, Other (non HMO) | Admitting: Vascular Surgery

## 2016-07-03 ENCOUNTER — Encounter (INDEPENDENT_AMBULATORY_CARE_PROVIDER_SITE_OTHER): Payer: Managed Care, Other (non HMO)

## 2016-07-27 ENCOUNTER — Other Ambulatory Visit (INDEPENDENT_AMBULATORY_CARE_PROVIDER_SITE_OTHER): Payer: Self-pay | Admitting: Vascular Surgery

## 2016-07-27 DIAGNOSIS — E785 Hyperlipidemia, unspecified: Secondary | ICD-10-CM

## 2016-07-27 DIAGNOSIS — R0989 Other specified symptoms and signs involving the circulatory and respiratory systems: Secondary | ICD-10-CM

## 2016-07-30 ENCOUNTER — Encounter (INDEPENDENT_AMBULATORY_CARE_PROVIDER_SITE_OTHER): Payer: Self-pay | Admitting: Vascular Surgery

## 2016-07-30 ENCOUNTER — Ambulatory Visit (INDEPENDENT_AMBULATORY_CARE_PROVIDER_SITE_OTHER): Payer: Managed Care, Other (non HMO)

## 2016-07-30 ENCOUNTER — Ambulatory Visit (INDEPENDENT_AMBULATORY_CARE_PROVIDER_SITE_OTHER): Payer: Managed Care, Other (non HMO) | Admitting: Vascular Surgery

## 2016-07-30 VITALS — BP 154/71 | HR 63 | Resp 16 | Ht 64.0 in | Wt 101.8 lb

## 2016-07-30 DIAGNOSIS — E785 Hyperlipidemia, unspecified: Secondary | ICD-10-CM

## 2016-07-30 DIAGNOSIS — F17219 Nicotine dependence, cigarettes, with unspecified nicotine-induced disorders: Secondary | ICD-10-CM | POA: Diagnosis not present

## 2016-07-30 DIAGNOSIS — I6523 Occlusion and stenosis of bilateral carotid arteries: Secondary | ICD-10-CM | POA: Diagnosis not present

## 2016-07-30 DIAGNOSIS — R0989 Other specified symptoms and signs involving the circulatory and respiratory systems: Secondary | ICD-10-CM

## 2016-07-30 NOTE — Progress Notes (Signed)
Subjective:    Patient ID: Wendy Ferrell, female    DOB: [birth year], 58 y.o.   MRN: [id number] Chief Complaint  Patient presents with  . Follow-up   Patient follow up today with a carotid duplex after undergoing a CTA of the neck for a bruit heard on exam. The patient underwent a bilateral carotid duplex scan which showed no change from the previous exam with CTA on 05-07-16. Duplex is stable at Right ICA stenosis (1-39%) and Left ICA stenosis (40-59%), Left CCA bulb (60-79%). The patient denies experiencing Amaurosis Fugax, TIA like symptoms or focal motor deficits.    Review of Systems  Constitutional: Negative.   HENT: Negative.   Eyes: Negative.   Respiratory: Negative.   Cardiovascular: Negative.   Gastrointestinal: Negative.   Endocrine: Negative.   Genitourinary: Negative.   Musculoskeletal: Negative.   Skin: Negative.   Allergic/Immunologic: Negative.   Neurological: Negative.   Hematological: Negative.   Psychiatric/Behavioral: Negative.       Objective:   Physical Exam  Constitutional: She is oriented to person, place, and time. She appears well-developed and well-nourished.  HENT:  Head: Normocephalic and atraumatic.  Right Ear: External ear normal.  Left Ear: External ear normal.  Eyes: Conjunctivae and EOM are normal. Pupils are equal, round, and reactive to light.  Neck: Normal range of motion.  Cardiovascular: Normal rate, regular rhythm, normal heart sounds and intact distal pulses.   Pulses:      Radial pulses are 2+ on the right side, and 2+ on the left side.       Dorsalis pedis pulses are 1+ on the right side, and 1+ on the left side.       Posterior tibial pulses are 1+ on the right side, and 1+ on the left side.  Bilateral Carotid Bruit Noted.   Pulmonary/Chest: Effort normal and breath sounds normal.  Abdominal: Soft. Bowel sounds are normal.  Musculoskeletal: Normal range of motion. She exhibits no edema.  Neurological: She is alert and oriented  to person, place, and time.  Skin: Skin is warm and dry.  Psychiatric: She has a normal mood and affect. Her behavior is normal. Judgment and thought content normal.   BP (!) 154/71 (BP Location: Left Arm)   Pulse 63   Resp 16   Ht 5\' 4"  (1.626 m)   Wt 101 lb 12.8 oz (46.2 kg)   BMI 17.47 kg/m   Past Medical History:  Diagnosis Date  . COPD (chronic obstructive pulmonary disease) ([place])   . Hypertension   . Nicotine addiction   . Peripheral vascular disease [facility])    Social History   Social History  . Marital status: Legally Separated    Spouse name: N/A  . Number of children: N/A  . Years of education: N/A   Occupational History  . Not on file.   Social History Main Topics  . Smoking status: Former Smoker    Packs/day: 0.50    Years: 45.00    Types: Cigarettes  . Smokeless tobacco: Never Used  . Alcohol use No  . Drug use: No  . Sexual activity: Not on file   Other Topics Concern  . Not on file   Social History Narrative  . No narrative on file   Past Surgical History:  Procedure Laterality Date  . ABDOMINAL HYSTERECTOMY    . COLONOSCOPY WITH PROPOFOL N/A 02/21/2016   Procedure: COLONOSCOPY WITH PROPOFOL;  Surgeon: Lollie Sails, MD;  Location: [facility] ENDOSCOPY;  Service:  Endoscopy;  Laterality: N/A;  . MASTOIDECTOMY    . NECK SURGERY    . PERIPHERAL VASCULAR CATHETERIZATION Left 05/29/2016   Procedure: Lower Extremity Angiography;  Surgeon: Katha Cabal, MD;  Location: [place] [facility];  Service: Cardiovascular;  Laterality: Left;  . PERIPHERAL VASCULAR CATHETERIZATION  05/29/2016   Procedure: Lower Extremity Intervention;  Surgeon: Katha Cabal, MD;  Location: [place] [facility];  Service: Cardiovascular;;  . TONSILLECTOMY     Family History  Problem Relation Age of Onset  . COPD Mother   . Hypertension Mother   . COPD Father   . Breast cancer Maternal Grandmother 60  . Breast cancer Cousin 50    maternal side   No Known  Allergies     Assessment & Plan:  Patient follow up today with a carotid duplex after undergoing a CTA of the neck for a bruit heard on exam. The patient underwent a bilateral carotid duplex scan which showed no change from the previous exam with CTA on 05-07-16. Duplex is stable at Right ICA stenosis (1-39%) and Left ICA stenosis (40-59%), Left CCA bulb (60-79%). The patient denies experiencing Amaurosis Fugax, TIA like symptoms or focal motor deficits.   1. Bilateral carotid artery stenosis - Stable Studies reviewed with patient. Patient asymptomatic with stable duplex.  No intervention at this time.  Patient to return in six months for surveillance carotid duplex. Patient to continue medical optimization with ASA and dyslipidemia medication. I have discussed with the patient at length the risk factors for and pathogenesis of atherosclerotic disease and encouraged a healthy diet, regular exercise regimen and blood pressure / glucose control.  Patient was instructed to contact our office in the interim with problems such as arm / leg weakness or numbness, speech / swallowing difficulty or temporary monocular blindness. The patient expresses their understanding.   2. Cigarette nicotine dependence with nicotine-induced disorder - Stable I have discussed (approximately 5 minutes) with the patient the role of tobacco in the pathogenesis of atherosclerosis and its effect on the progression of the disease, impact on the durability of interventions and its limitations on the formation of collateral pathways. I have recommended absolute tobacco cessation. I have discussed various options available for assistance with tobacco cessation including over the counter methods (Nicotine gum, patch and lozenges). We also discussed prescription options (Chantix, Nicotine Inhaler / Nasal Spray). The patient is not interested in pursuing any prescription tobacco cessation options at this time. The patient voices their  understanding.   3. Hyperlipidemia, unspecified hyperlipidemia type - Stable On ASA and statin for medical optimization. Encouraged good control as its slows the progression of atherosclerotic disease  Current Outpatient Prescriptions on File Prior to Visit  Medication Sig Dispense Refill  . albuterol (PROVENTIL HFA;VENTOLIN HFA) 108 (90 BASE) MCG/ACT inhaler Inhale 2 puffs into the lungs every 6 (six) hours as needed for wheezing or shortness of breath.    Marland Kitchen aspirin 81 MG chewable tablet Chew by mouth daily.    Marland Kitchen atorvastatin (LIPITOR) 40 MG tablet Take 40 mg by mouth daily.    . benzonatate (TESSALON) 200 MG capsule Take 200 mg by mouth 3 (three) times daily as needed for cough.    . celecoxib (CELEBREX) 200 MG capsule Take 200 mg by mouth 2 (two) times daily.    . COMBIVENT RESPIMAT 20-100 MCG/ACT AERS respimat     . cyclobenzaprine (FLEXERIL) 5 MG tablet Take 5 mg by mouth 3 (three) times daily as needed for muscle  spasms.    Marland Kitchen ibuprofen (ADVIL,MOTRIN) 200 MG tablet Take 200 mg by mouth every 6 (six) hours as needed for moderate pain.    . mometasone-formoterol (DULERA) 100-5 MCG/ACT AERO Inhale 2 puffs into the lungs 2 (two) times daily. 1 Inhaler 1  . moxifloxacin (AVELOX) 400 MG tablet Take 1 tablet (400 mg total) by mouth daily at 8 pm. 5 tablet 0  . pantoprazole (PROTONIX) 40 MG tablet Take 40 mg by mouth daily.    . polyethylene glycol-electrolytes (NULYTELY/GOLYTELY) 420 g solution Take as directed for colonic prep.    . predniSONE (DELTASONE) 20 MG tablet Take 2 tablets (40 mg total) by mouth daily with breakfast. 10 tablet 0  . tiotropium (SPIRIVA) 18 MCG inhalation capsule Place 1 capsule (18 mcg total) into inhaler and inhale daily. 30 capsule 12  . ZONTIVITY 2.08 MG TABS      No current facility-administered medications on file prior to visit.     There are no Patient Instructions on file for this visit. No Follow-up on file.   Abdulkadir Emmanuel A Devan Danzer, PA-C

## 2016-08-09 ENCOUNTER — Encounter: Payer: Self-pay | Admitting: Emergency Medicine

## 2016-08-09 ENCOUNTER — Emergency Department: Payer: Managed Care, Other (non HMO)

## 2016-08-09 ENCOUNTER — Emergency Department
Admission: EM | Admit: 2016-08-09 | Discharge: 2016-08-09 | Disposition: A | Discharge status: Home / Self Care | Payer: Managed Care, Other (non HMO) | Attending: Student in an Organized Health Care Education/Training Program | Admitting: Student in an Organized Health Care Education/Training Program

## 2016-08-09 DIAGNOSIS — R42 Dizziness and giddiness: Secondary | ICD-10-CM

## 2016-08-09 DIAGNOSIS — Z79899 Other long term (current) drug therapy: Secondary | ICD-10-CM | POA: Diagnosis not present

## 2016-08-09 DIAGNOSIS — Z7982 Long term (current) use of aspirin: Secondary | ICD-10-CM | POA: Insufficient documentation

## 2016-08-09 DIAGNOSIS — Z791 Long term (current) use of non-steroidal anti-inflammatories (NSAID): Secondary | ICD-10-CM | POA: Diagnosis not present

## 2016-08-09 DIAGNOSIS — I10 Essential (primary) hypertension: Secondary | ICD-10-CM | POA: Diagnosis not present

## 2016-08-09 DIAGNOSIS — Z87891 Personal history of nicotine dependence: Secondary | ICD-10-CM | POA: Diagnosis not present

## 2016-08-09 DIAGNOSIS — J441 Chronic obstructive pulmonary disease with (acute) exacerbation: Secondary | ICD-10-CM | POA: Diagnosis not present

## 2016-08-09 LAB — CBC WITH DIFFERENTIAL/PLATELET
BASOS PCT: 1 %
Basophils Absolute: 0 10*3/uL (ref 0–0.1)
EOS ABS: 0 10*3/uL (ref 0–0.7)
EOS PCT: 1 %
HEMATOCRIT: 48 % — AB (ref 35.0–47.0)
Hemoglobin: 16.6 g/dL — ABNORMAL HIGH (ref 12.0–16.0)
Lymphocytes Relative: 21 %
Lymphs Abs: 1.4 10*3/uL (ref 1.0–3.6)
MCH: 33.8 pg (ref 26.0–34.0)
MCHC: 34.6 g/dL (ref 32.0–36.0)
MCV: 97.8 fL (ref 80.0–100.0)
MONO ABS: 0.5 10*3/uL (ref 0.2–0.9)
MONOS PCT: 7 %
NEUTROS ABS: 4.7 10*3/uL (ref 1.4–6.5)
Neutrophils Relative %: 70 %
Platelets: 171 10*3/uL (ref 150–440)
RBC: 4.91 MIL/uL (ref 3.80–5.20)
RDW: 13.4 % (ref 11.5–14.5)
WBC: 6.6 10*3/uL (ref 3.6–11.0)

## 2016-08-09 LAB — COMPREHENSIVE METABOLIC PANEL
ALBUMIN: 4 g/dL (ref 3.5–5.0)
ALT: 20 U/L (ref 14–54)
ANION GAP: 11 (ref 5–15)
AST: 31 U/L (ref 15–41)
Alkaline Phosphatase: 75 U/L (ref 38–126)
BILIRUBIN TOTAL: 0.5 mg/dL (ref 0.3–1.2)
BUN: 15 mg/dL (ref 6–20)
CO2: 30 mmol/L (ref 22–32)
Calcium: 8.7 mg/dL — ABNORMAL LOW (ref 8.9–10.3)
Chloride: 96 mmol/L — ABNORMAL LOW (ref 101–111)
Creatinine, Ser: 0.81 mg/dL (ref 0.44–1.00)
GFR calc Af Amer: >60 mL/min (ref >60)
GLUCOSE: 88 mg/dL (ref 65–99)
POTASSIUM: 3 mmol/L — AB (ref 3.5–5.1)
Sodium: 137 mmol/L (ref 135–145)
TOTAL PROTEIN: 7.1 g/dL (ref 6.5–8.1)

## 2016-08-09 LAB — TROPONIN I

## 2016-08-09 MED ORDER — IPRATROPIUM-ALBUTEROL 0.5-2.5 (3) MG/3ML IN SOLN
3.0000 mL | Freq: Once | RESPIRATORY_TRACT | Status: AC
  Administered 2016-08-09: 3 mL via RESPIRATORY_TRACT
  Filled 2016-08-09: qty 3

## 2016-08-09 MED ORDER — SODIUM CHLORIDE 0.9 % IV BOLUS (SEPSIS)
1000.0000 mL | Freq: Once | INTRAVENOUS | Status: AC
  Administered 2016-08-09: 1000 mL via INTRAVENOUS

## 2016-08-09 MED ORDER — POTASSIUM CHLORIDE CRYS ER 20 MEQ PO TBCR
40.0000 meq | EXTENDED_RELEASE_TABLET | Freq: Once | ORAL | Status: AC
  Administered 2016-08-09: 40 meq via ORAL
  Filled 2016-08-09: qty 2

## 2016-08-09 MED ORDER — PREDNISONE 20 MG PO TABS: 40.0000 mg | ORAL_TABLET | Freq: Every day | ORAL | 0 refills | Status: AC

## 2016-08-09 NOTE — ED Triage Notes (Signed)
Pt presents with dizziness and headaches for two weeks.

## 2016-08-09 NOTE — ED Notes (Signed)
Pt returned from MRI °

## 2016-08-09 NOTE — ED Notes (Signed)
Pt taken to MRI at this time

## 2016-08-09 NOTE — ED Notes (Signed)
Pt taken for CT at this time

## 2016-08-09 NOTE — ED Notes (Signed)
NAD noted at time of D/C. Pt denies questions or concerns. Pt ambulatory to the lobby at this time. Pt refused wheelchiar to the lobby.

## 2016-08-09 NOTE — ED Notes (Signed)
Pt resting in bed at this time. NAD Noted. Will continue to monitor for further patient needs.

## 2016-08-09 NOTE — ED Notes (Signed)
Pt ambulatory in hallway with assistance with pulse oximetry monitoring pt ranged from 91% to 94%, RN and MD notified.

## 2016-08-09 NOTE — ED Provider Notes (Signed)
[facility] Emergency Department Provider Note    First MD Initiated Contact with Patient 08/09/16 [phone]     (approximate)  I have reviewed the triage vital signs and the nursing notes.   HISTORY  Chief Complaint Dizziness and Headache    HPI Wendy Ferrell is a 60 y.o. female *presents with 2 weeks of dizziness and intermittent headaches. Patient was seen by her primary care physician told that she was dehydrated and orthostatic. She received IV fluids as well as meclizine without improvement in symptoms. Patient states the dizziness is constant but worsened when she gets up to move around. Denies any numbness or tingling. States that she does have extensive history of peripheral vascular disease status post lower extremity stenting. She takes a baby aspirin but does still smoke. Also has known carotid artery stenosis.  Denies any chest pain or shortness of breath. No nausea or vomiting.   Past Medical History:  Diagnosis Date  . COPD (chronic obstructive pulmonary disease) ([place])   . Hypertension   . Nicotine addiction   . Peripheral vascular disease ([place])    Family History  Problem Relation Age of Onset  . COPD Mother   . Hypertension Mother   . COPD Father   . Breast cancer Maternal Grandmother 60  . Breast cancer Cousin 50    maternal side   Past Surgical History:  Procedure Laterality Date  . ABDOMINAL HYSTERECTOMY    . COLONOSCOPY WITH PROPOFOL N/A 02/21/2016   Procedure: COLONOSCOPY WITH PROPOFOL;  Surgeon: Lollie Sails, MD;  Location: [facility] ENDOSCOPY;  Service: Endoscopy;  Laterality: N/A;  . MASTOIDECTOMY    . NECK SURGERY    . PERIPHERAL VASCULAR CATHETERIZATION Left 05/29/2016   Procedure: Lower Extremity Angiography;  Surgeon: Katha Cabal, MD;  Location: [place] [facility];  Service: Cardiovascular;  Laterality: Left;  . PERIPHERAL VASCULAR CATHETERIZATION  05/29/2016   Procedure: Lower Extremity Intervention;  Surgeon:  Katha Cabal, MD;  Location: [place] [facility];  Service: Cardiovascular;;  . TONSILLECTOMY     Patient Active Problem List   Diagnosis Date Noted  . Bilateral carotid artery stenosis 07/30/2016  . Hyperlipidemia 06/19/2016  . PAD (peripheral artery disease) ([place]) 06/19/2016  . COPD with exacerbation ([place]) 02/14/2015  . Community acquired pneumonia 02/14/2015  . Nicotine addiction 02/14/2015  . COPD exacerbation ([place]) 02/14/2015      Prior to Admission medications   Medication Sig Start Date End Date Taking? Authorizing Provider  albuterol (PROVENTIL HFA;VENTOLIN HFA) 108 (90 BASE) MCG/ACT inhaler Inhale 2 puffs into the lungs every 6 (six) hours as needed for wheezing or shortness of breath.    Historical Provider, MD  aspirin 81 MG chewable tablet Chew by mouth daily.    Historical Provider, MD  atorvastatin (LIPITOR) 40 MG tablet Take 40 mg by mouth daily.    Historical Provider, MD  benzonatate (TESSALON) 200 MG capsule Take 200 mg by mouth 3 (three) times daily as needed for cough.    Historical Provider, MD  celecoxib (CELEBREX) 200 MG capsule Take 200 mg by mouth 2 (two) times daily.    Historical Provider, MD  COMBIVENT RESPIMAT 20-100 MCG/ACT AERS respimat  04/22/16   Historical Provider, MD  cyclobenzaprine (FLEXERIL) 5 MG tablet Take 5 mg by mouth 3 (three) times daily as needed for muscle spasms.    Historical Provider, MD  ibuprofen (ADVIL,MOTRIN) 200 MG tablet Take 200 mg by mouth every 6 (six) hours as needed for  moderate pain.    Historical Provider, MD  mometasone-formoterol (DULERA) 100-5 MCG/ACT AERO Inhale 2 puffs into the lungs 2 (two) times daily. 02/16/15   Aldean Jewett, MD  moxifloxacin (AVELOX) 400 MG tablet Take 1 tablet (400 mg total) by mouth daily at 8 pm. 02/16/15   Aldean Jewett, MD  pantoprazole (PROTONIX) 40 MG tablet Take 40 mg by mouth daily.    Historical Provider, MD  polyethylene glycol-electrolytes (NULYTELY/GOLYTELY) 420 g solution Take  as directed for colonic prep. 01/06/16   Historical Provider, MD  predniSONE (DELTASONE) 20 MG tablet Take 2 tablets (40 mg total) by mouth daily with breakfast. 02/16/15   Aldean Jewett, MD  tiotropium (SPIRIVA) 18 MCG inhalation capsule Place 1 capsule (18 mcg total) into inhaler and inhale daily. 02/16/15   Aldean Jewett, MD  ZONTIVITY 2.08 MG TABS  05/16/16   Historical Provider, MD    Allergies Patient has no known allergies.    Social History Social History  Substance Use Topics  . Smoking status: Former Smoker    Packs/day: 0.50    Years: 45.00    Types: Cigarettes  . Smokeless tobacco: Never Used  . Alcohol use No    Review of Systems Patient denies headaches, rhinorrhea, blurry vision, numbness, shortness of breath, chest pain, edema, cough, abdominal pain, nausea, vomiting, diarrhea, dysuria, fevers, rashes or hallucinations unless otherwise stated above in HPI. ____________________________________________   PHYSICAL EXAM:  VITAL SIGNS: Vitals:   08/09/16 0924  BP: (!) 141/71  Pulse: 66  Resp: 20  Temp: 97.4 F (36.3 C)    Constitutional: Alert and oriented. Well appearing and in no acute distress. Eyes: Conjunctivae are normal. PERRL. EOMI. Head: Atraumatic. Nose: No congestion/rhinnorhea. Mouth/Throat: Mucous membranes are moist.  Oropharynx non-erythematous. Neck: No stridor. Painless ROM. No cervical spine tenderness to palpation Hematological/Lymphatic/Immunilogical: No cervical lymphadenopathy. Cardiovascular: Normal rate, regular rhythm. Grossly normal heart sounds.  Good peripheral circulation. Respiratory: mild tachypnea, no use of accessory muscles,  Diffuse expiratory wheezing. Gastrointestinal: Soft and nontender. No distention. No abdominal bruits. No CVA tenderness. Genitourinary:  Musculoskeletal: No lower extremity tenderness nor edema.  No joint effusions. Neurologic:  CN- intact.  No facial droop, Normal FNF.  Normal heel to shin.   Sensation intact bilaterally. Normal speech and language. No gross focal neurologic deficits are appreciated. No gait instability. Skin:  Skin is warm, dry and intact. No rash noted. Psychiatric: Mood and affect are normal. Speech and behavior are normal.  ____________________________________________   LABS (all labs ordered are listed, but only abnormal results are displayed)  No results found for this or any previous visit (from the past 24 hour(s)). ____________________________________________  EKG My review and personal interpretation at Time: 9:52   Indication: dizziness  Rate: 75  Rhythm: sinus with occasional PAC Axis: normal Other: non specific st changes, normal intervals ____________________________________________  RADIOLOGY  I personally reviewed all radiographic images ordered to evaluate for the above acute complaints and reviewed radiology reports and findings.  These findings were personally discussed with the patient.  Please see medical record for radiology report. ____________________________________________   PROCEDURES  Procedure(s) performed: none Procedures    Critical Care performed: no ____________________________________________   INITIAL IMPRESSION / ASSESSMENT AND PLAN / ED COURSE  Pertinent labs & imaging results that were available during my care of the patient were reviewed by me and considered in my medical decision making (see chart for details).  DDX: cva, tia, bppv, copd, chf, acs, dehydration, orthostasis.  Wendy Ferrell is a 60 y.o. who presents to the ED with dizziness for 2 weeks. Patient does appear mildly dehydrated but is otherwise afebrile and hemodynamically stable. She has no focal neurologic deficits at this time. She's not had any formal neuroimaging previously will start with CT imaging to evaluate for any evidence of acute subdural hematoma or mass. If that is normal I do anticipate need for further evaluation with MRI she is  failing outpatient treatment with meclizine. She was orthostatic in clinic and again is looking dehydrated therefore we will order IV fluids. Patient has a history of COPD but is in no acute distress. We'll provide do a to help with wheezing. Not clinically consistent with ACS or dysrhythmia.  The patient will be placed on continuous pulse oximetry and telemetry for monitoring.  Laboratory evaluation will be sent to evaluate for the above complaints.     Clinical Course as of Aug 09 1440  Thu Aug 09, 2016  1218 CT without acute abnormality. Chest x-ray with evidence of underlying COPD. Patient was orthostatic by vital signs. We'll provide IV fluids for resuscitation. Based on her duration of symptoms will order MRI to further evaluate and rule out any sort of ischemic process.  [PR]  1438 Patient was ambulated on room air without any hypoxia. Patient provided K-Dur for mild hypokalemia. Patient without any orthostasis after IV fluids. I recommended admission for additional IV fluids given her persistent dyspnea thepatientisrequestingfurtheroutpatientmanagement.Idofeelthatthisisreasonable.Patienttoleratingoralhydration.    [PR]  [id number] She is not consistent with ACS or congestive heart failure. Likely BPPV and patient has a prescription for meclizine. Stable for further outpatient evaluation.  Patient was able to tolerate PO and was able to ambulate with a steady gait.  Have discussed with the patient and available family all diagnostics and treatments performed thus far and all questions were answered to the best of my ability. The patient demonstrates understanding and agreement with plan.   [PR]    Clinical Course User Index [PR] Merlyn Lot, MD     ____________________________________________   FINAL CLINICAL IMPRESSION(S) / ED DIAGNOSES  Final diagnoses:  Dizziness  COPD with acute exacerbation ([place])      NEW MEDICATIONS STARTED DURING THIS VISIT:  New Prescriptions   No  medications on file     Note:  This document was prepared using Dragon voice recognition software and may include unintentional dictation errors.    Merlyn Lot, MD 08/09/16 [phone]

## 2016-08-09 NOTE — ED Notes (Signed)
Pt assisted to the bathroom at this time. NAD noted. Pt tolerated well. Will continue to monitor for further patient needs.

## 2016-09-20 ENCOUNTER — Ambulatory Visit (INDEPENDENT_AMBULATORY_CARE_PROVIDER_SITE_OTHER): Payer: Managed Care, Other (non HMO)

## 2016-09-20 ENCOUNTER — Encounter (INDEPENDENT_AMBULATORY_CARE_PROVIDER_SITE_OTHER): Payer: Self-pay | Admitting: Vascular Surgery

## 2016-09-20 ENCOUNTER — Ambulatory Visit (INDEPENDENT_AMBULATORY_CARE_PROVIDER_SITE_OTHER): Payer: Managed Care, Other (non HMO) | Admitting: Vascular Surgery

## 2016-09-20 VITALS — BP 123/70 | HR 74 | Resp 16 | Ht 65.0 in | Wt 99.0 lb

## 2016-09-20 DIAGNOSIS — J441 Chronic obstructive pulmonary disease with (acute) exacerbation: Secondary | ICD-10-CM

## 2016-09-20 DIAGNOSIS — I6523 Occlusion and stenosis of bilateral carotid arteries: Secondary | ICD-10-CM

## 2016-09-20 DIAGNOSIS — I70219 Atherosclerosis of native arteries of extremities with intermittent claudication, unspecified extremity: Secondary | ICD-10-CM | POA: Insufficient documentation

## 2016-09-20 DIAGNOSIS — R55 Syncope and collapse: Secondary | ICD-10-CM | POA: Diagnosis not present

## 2016-09-20 DIAGNOSIS — I739 Peripheral vascular disease, unspecified: Secondary | ICD-10-CM | POA: Diagnosis not present

## 2016-09-20 DIAGNOSIS — I70213 Atherosclerosis of native arteries of extremities with intermittent claudication, bilateral legs: Secondary | ICD-10-CM | POA: Diagnosis not present

## 2016-09-20 DIAGNOSIS — E782 Mixed hyperlipidemia: Secondary | ICD-10-CM

## 2016-09-20 NOTE — Progress Notes (Signed)
MRN : [id number]  Wendy Ferrell is a 61 y.o. ([birth year]) female who presents with chief complaint of  Chief Complaint  Patient presents with  . Re-evaluation    Ultrasound follow up  .  History of Present Illness: The patient returns to the office for followup and review status post angiogram with intervention. The patient notes improvement in the lower extremity symptoms. No interval shortening of the patient's claudication distance or rest pain symptoms although she does note she still has some claudication particularly if she is trying to walk very fast or very far. Previous wounds have now healed.  No new ulcers or wounds have occurred since the last visit.  The patient is also noting frequent syncopal episodes. The patient describes it as a light headedness and denies the "room spinning".  It lasted on the order of minutes and resolved completely.  There was no loss of consciousness.  There have been two or three prior episodes over the past several years.  There is no recent history of TIA symptoms or focal motor deficits. There is no prior documented CVA.  The patient is taking enteric-coated aspirin 81 mg daily at the time.  There is no history of migraine headaches or prior diagnosis of ocular migraine. There is no history of seizures.  The patient has a history of coronary artery disease, no recent episodes of angina or shortness of breath. The patient denies PAD or claudication symptoms. There is a history of hyperlipidemia which is being treated with a statin.  The patient denies history of DVT, PE or superficial thrombophlebitis.   ABI's Rt=0.92 and Lt=0.91triphasic signals at the ankles bilaterally  (previous ABI's Rt=0.94 and Lt=0.60) Duplex US of the distal aorta and bilateral common iliac arteries shows moderate restenosis with peak systolic velocities of 123456 cm/s  Previous carotid duplex demonstrates a widely patent right internal carotid artery with a  high-grade right external carotid artery stenosis. There is a suggestion of a greater then 50% stenosis in the left carotid bulb. Velocities within the internal carotid artery correlate with a 50-69% stenosis.  (previous CT angiogram 4-6 months ago is reported as 70%)    Current Meds  Medication Sig  . ANORO ELLIPTA 62.5-25 MCG/INH AEPB INAHLE ONCE A DAY  . aspirin 81 MG chewable tablet Chew by mouth daily.  Marland Kitchen atorvastatin (LIPITOR) 40 MG tablet Take 40 mg by mouth daily.  . budesonide-formoterol (SYMBICORT) 80-4.5 MCG/ACT inhaler Inhale 2 puffs into the lungs 2 (two) times daily.  . celecoxib (CELEBREX) 200 MG capsule Take 200 mg by mouth at bedtime.   . chlorthalidone (HYGROTON) 25 MG tablet Take 12.5 mg by mouth daily.  . COMBIVENT RESPIMAT 20-100 MCG/ACT AERS respimat Inhale 1 puff into the lungs 3 (three) times daily.   . cyclobenzaprine (FLEXERIL) 5 MG tablet Take 5 mg by mouth 2 (two) times daily.   Marland Kitchen ibuprofen (ADVIL,MOTRIN) 200 MG tablet Take 200 mg by mouth every 6 (six) hours as needed for moderate pain.  . meclizine (ANTIVERT) 25 MG tablet Take 25 mg by mouth 3 (three) times daily.  . mometasone-formoterol (DULERA) 100-5 MCG/ACT AERO Inhale 2 puffs into the lungs 2 (two) times daily.  Marland Kitchen ZONTIVITY 2.08 MG TABS Take 1 tablet by mouth daily.     Past Medical History:  Diagnosis Date  . COPD (chronic obstructive pulmonary disease) ([place])   . Hypertension   . Nicotine addiction   . Peripheral vascular disease [facility])     Past Surgical  History:  Procedure Laterality Date  . ABDOMINAL HYSTERECTOMY    . COLONOSCOPY WITH PROPOFOL N/A 02/21/2016   Procedure: COLONOSCOPY WITH PROPOFOL;  Surgeon: Lollie Sails, MD;  Location: [facility] ENDOSCOPY;  Service: Endoscopy;  Laterality: N/A;  . MASTOIDECTOMY    . NECK SURGERY    . PERIPHERAL VASCULAR CATHETERIZATION Left 05/29/2016   Procedure: Lower Extremity Angiography;  Surgeon: Katha Cabal, MD;  Location: [place] CV LAB;   Service: Cardiovascular;  Laterality: Left;  . PERIPHERAL VASCULAR CATHETERIZATION  05/29/2016   Procedure: Lower Extremity Intervention;  Surgeon: Katha Cabal, MD;  Location: [place] [facility] LAB;  Service: Cardiovascular;;  . TONSILLECTOMY      Social History Social History  Substance Use Topics  . Smoking status: Former Smoker    Packs/day: 0.50    Years: 45.00    Types: Cigarettes  . Smokeless tobacco: Never Used  . Alcohol use No    Family History Family History  Problem Relation Age of Onset  . COPD Mother   . Hypertension Mother   . COPD Father   . Breast cancer Maternal Grandmother 60  . Breast cancer Cousin 72    maternal side  No family history of bleeding/clotting disorders, porphyria or autoimmune disease   No Known Allergies   REVIEW OF SYSTEMS (Negative unless checked)  Constitutional: [] Weight loss  [] Fever  [] Chills Cardiac: [] Chest pain   [] Chest pressure   [] Palpitations   [] Shortness of breath when laying flat   [] Shortness of breath with exertion. Vascular:  [x] Pain in legs with walking   [] Pain in legs at rest  [] History of DVT   [] Phlebitis   [] Swelling in legs   [] Varicose veins   [] Non-healing ulcers Pulmonary:   [] Uses home oxygen   [] Productive cough   [] Hemoptysis   [] Wheeze  [] COPD   [] Asthma Neurologic:  [x] Dizziness   [] Seizures   [] History of stroke   [] History of TIA  [] Aphasia   [] Vissual changes   [] Weakness or numbness in arm   [] Weakness or numbness in leg Musculoskeletal:   [] Joint swelling   [] Joint pain   [] Low back pain Hematologic:  [] Easy bruising  [] Easy bleeding   [] Hypercoagulable state   [] Anemic Gastrointestinal:  [] Diarrhea   [] Vomiting  [] Gastroesophageal reflux/heartburn   [] Difficulty swallowing. Genitourinary:  [] Chronic kidney disease   [] Difficult urination  [] Frequent urination   [] Blood in urine Skin:  [] Rashes   [] Ulcers  Psychological:  [] History of anxiety   []  History of major depression.  Physical  Examination  Vitals:   09/20/16 0900  BP: 123/70  Pulse: 74  Resp: 16  Weight: 99 lb (44.9 kg)  Height: 5\' 5"  (1.651 m)   Body mass index is 16.47 kg/m. Gen: WD/WN, NAD Head: [place]/AT, No temporalis wasting.  Ear/Nose/Throat: Hearing grossly intact, nares w/o erythema or drainage, poor dentition Eyes: PER, EOMI, sclera nonicteric.  Neck: Supple, no masses.  No JVD.  Pulmonary:  Good air movement, clear to auscultation bilaterally, no use of accessory muscles.  Cardiac: RRR, normal S1, S2, no Murmurs. Vascular: the forefeet are cool to the touch bilaterally with 3 second capillary refill. There are no open wounds or sores; bilateral carotid bruits are auscultated Vessel Right Left  Radial Palpable Palpable  Ulnar Palpable Palpable  Brachial Palpable Palpable  Carotid Palpable Palpable  Femoral Palpable Palpable  Popliteal Trace Palpable Trace Palpable  PT Not Palpable Not Palpable  DP Not Palpable Not Palpable   Gastrointestinal: soft, non-distended. No guarding/no peritoneal  signs.  Musculoskeletal: M/S 5/5 throughout.  No deformity or atrophy.  Neurologic: CN 2-12 intact. Pain and light touch intact in extremities.  Symmetrical.  Speech is fluent. Motor exam as listed above. Psychiatric: Judgment intact, Mood & affect appropriate for pt's clinical situation. Dermatologic: No rashes or ulcers noted.  No changes consistent with cellulitis. Lymph : No Cervical lymphadenopathy, no lichenification or skin changes of chronic lymphedema.  CBC Lab Results  Component Value Date   WBC 6.6 08/09/2016   HGB 16.6 (H) 08/09/2016   HCT 48.0 (H) 08/09/2016   MCV 97.8 08/09/2016   PLT 171 08/09/2016    BMET    Component Value Date/Time   NA 137 08/09/2016 1011   K 3.0 (L) 08/09/2016 1011   CL 96 (L) 08/09/2016 1011   CO2 30 08/09/2016 1011   GLUCOSE 88 08/09/2016 1011   BUN 15 08/09/2016 1011   CREATININE 0.81 08/09/2016 1011   CALCIUM 8.7 (L) 08/09/2016 1011   GFRNONAA >60  08/09/2016 1011   GFRAA >60 08/09/2016 1011   CrCl cannot be calculated (Patient's most recent lab result is older than the maximum 21 days allowed.).  COAG No results found for: INR, PROTIME  Radiology No results found.   Assessment/Plan  1. Atherosclerosis of native artery of both lower extremities with intermittent claudication (HCC) Recommend:  The patient is status post successful angiogram with intervention.  The patient reports that the claudication symptoms and leg pain improved.   The patient denies lifestyle limiting changes at this point in time.  No further invasive studies, angiography or surgery at this time. However, noninvasive studies do suggest moderate in-stent stenosis of the proximal common iliac stents. Based on this I believe she should be followed at 3 month intervals rather than six-month intervals.  The patient should continue walking and begin a more formal exercise program.  The patient should continue antiplatelet therapy and aggressive treatment of the lipid abnormalities  Smoking cessation was again discussed  The patient should continue wearing graduated compression socks 10-15 mmHg strength to control the mild edema.  Patient should undergo noninvasive studies as ordered. The patient will follow up with me after the studies.   - VAS [place] ABI WITH/WO TBI; Future - VAS US AORTA/IVC/ILIACS; Future  2. Bilateral carotid artery stenosis Recommend:  Given the patient's asymptomatic subcritical stenosis no further invasive testing or surgery at this time.  Given the antegrade flow in her vertebrals and the minimal disease on the right within the internal carotid artery I do not believe her syncope is related to her carotid disease.  Duplex ultrasound shows subcritical stenosis bilaterally.  However, she does have a lesion on the left side which will require treatment if it progresses any further.  Continue antiplatelet therapy as prescribed Continue  management of CAD, HTN and Hyperlipidemia Healthy heart diet,  encouraged exercise at least 4 times per week Follow up in 3 months with duplex ultrasound and physical exam based on >50% stenosis of the LICA carotid artery   - VAS US CAROTID; Future  3. COPD with exacerbation ([place]) Continue pulmonary medications and aerosols as already ordered, these medications have been reviewed and there are no changes at this time.  4. Mixed hyperlipidemia Continue statin as ordered and reviewed, no changes at this time  5. Syncope, unspecified syncope type See #2   Hortencia Pilar, MD  09/20/2016 9:13 AM

## 2016-12-27 ENCOUNTER — Ambulatory Visit (INDEPENDENT_AMBULATORY_CARE_PROVIDER_SITE_OTHER): Payer: 59 | Admitting: Vascular Surgery

## 2016-12-27 ENCOUNTER — Ambulatory Visit (INDEPENDENT_AMBULATORY_CARE_PROVIDER_SITE_OTHER): Payer: 59

## 2016-12-27 ENCOUNTER — Encounter (INDEPENDENT_AMBULATORY_CARE_PROVIDER_SITE_OTHER): Payer: Self-pay | Admitting: Vascular Surgery

## 2016-12-27 VITALS — BP 115/68 | HR 85 | Resp 16 | Wt 98.0 lb

## 2016-12-27 DIAGNOSIS — J449 Chronic obstructive pulmonary disease, unspecified: Secondary | ICD-10-CM

## 2016-12-27 DIAGNOSIS — I739 Peripheral vascular disease, unspecified: Secondary | ICD-10-CM | POA: Diagnosis not present

## 2016-12-27 DIAGNOSIS — I6523 Occlusion and stenosis of bilateral carotid arteries: Secondary | ICD-10-CM

## 2016-12-27 DIAGNOSIS — E782 Mixed hyperlipidemia: Secondary | ICD-10-CM

## 2016-12-27 DIAGNOSIS — I70213 Atherosclerosis of native arteries of extremities with intermittent claudication, bilateral legs: Secondary | ICD-10-CM

## 2016-12-27 NOTE — Progress Notes (Signed)
MRN : [id number]  Wendy Ferrell is a 61 y.o. (10-Aug-1956) female who presents with chief complaint of  Chief Complaint  Patient presents with  . Follow-up  .  History of Present Illness:The patient returns to the office for followup and review of the noninvasive studies. There have been no interval changes in lower extremity symptoms. No interval shortening of the patient's claudication distance or development of rest pain symptoms. No new ulcers or wounds have occurred since the last visit.  There have been no significant changes to the patient's overall health care.  The patient denies amaurosis fugax or recent TIA symptoms. There are no recent neurological changes noted. The patient denies history of DVT, PE or superficial thrombophlebitis. The patient denies recent episodes of angina or shortness of breath.   ABI Rt=0.82 and Lt=0.96  (previous ABI's Rt=0.92 and Lt=0.91) Duplex ultrasound of the Duplex US of the distal aorta and bilateral common iliac arteries shows moderate restenosis with peak systolic velocities of 458-099 cm/s similar to previous exam  Carotid duplex shows 1-39% RICA and 83-38% LICA no significant change compared to last study  Current Meds  Medication Sig  . ANORO ELLIPTA 62.5-25 MCG/INH AEPB INAHLE ONCE A DAY  . aspirin 81 MG chewable tablet Chew by mouth daily.  Marland Kitchen atorvastatin (LIPITOR) 40 MG tablet Take 40 mg by mouth daily.  . budesonide-formoterol (SYMBICORT) 80-4.5 MCG/ACT inhaler Inhale 2 puffs into the lungs 2 (two) times daily.  . celecoxib (CELEBREX) 200 MG capsule Take 200 mg by mouth at bedtime.   . chlorthalidone (HYGROTON) 25 MG tablet Take 12.5 mg by mouth daily.  . COMBIVENT RESPIMAT 20-100 MCG/ACT AERS respimat Inhale 1 puff into the lungs 3 (three) times daily.   . cyclobenzaprine (FLEXERIL) 5 MG tablet Take 5 mg by mouth 2 (two) times daily.   Marland Kitchen ibuprofen (ADVIL,MOTRIN) 200 MG tablet Take 200 mg by mouth every 6 (six) hours as needed for  moderate pain.  . meclizine (ANTIVERT) 25 MG tablet Take 25 mg by mouth 3 (three) times daily.  . mometasone-formoterol (DULERA) 100-5 MCG/ACT AERO Inhale 2 puffs into the lungs 2 (two) times daily.  Marland Kitchen ZONTIVITY 2.08 MG TABS Take 1 tablet by mouth daily.     Past Medical History:  Diagnosis Date  . COPD (chronic obstructive pulmonary disease) ([place])   . Hypertension   . Nicotine addiction   . Peripheral vascular disease [facility])     Past Surgical History:  Procedure Laterality Date  . ABDOMINAL HYSTERECTOMY    . COLONOSCOPY WITH PROPOFOL N/A 02/21/2016   Procedure: COLONOSCOPY WITH PROPOFOL;  Surgeon: Lollie Sails, MD;  Location: [facility] ENDOSCOPY;  Service: Endoscopy;  Laterality: N/A;  . MASTOIDECTOMY    . NECK SURGERY    . PERIPHERAL VASCULAR CATHETERIZATION Left 05/29/2016   Procedure: Lower Extremity Angiography;  Surgeon: Katha Cabal, MD;  Location: [address] CV LAB;  Service: Cardiovascular;  Laterality: Left;  . PERIPHERAL VASCULAR CATHETERIZATION  05/29/2016   Procedure: Lower Extremity Intervention;  Surgeon: Katha Cabal, MD;  Location: [place] CV LAB;  Service: Cardiovascular;;  . TONSILLECTOMY      Social History Social History  Substance Use Topics  . Smoking status: Former Smoker    Packs/day: 0.50    Years: 45.00    Types: Cigarettes  . Smokeless tobacco: Never Used  . Alcohol use No    Family History Family History  Problem Relation Age of Onset  . COPD Mother   .  Hypertension Mother   . COPD Father   . Breast cancer Maternal Grandmother 60  . Breast cancer Cousin 50    maternal side    No Known Allergies   REVIEW OF SYSTEMS (Negative unless checked)  Constitutional: [] Weight loss  [] Fever  [] Chills Cardiac: [] Chest pain   [] Chest pressure   [] Palpitations   [] Shortness of breath when laying flat   [x] Shortness of breath with exertion. Vascular:  [x] Pain in legs with walking   [] Pain in legs at rest  [] History of DVT    [] Phlebitis   [] Swelling in legs   [] Varicose veins   [] Non-healing ulcers Pulmonary:   [] Uses home oxygen   [] Productive cough   [] Hemoptysis   [] Wheeze  [x] COPD   [] Asthma Neurologic:  [] Dizziness   [] Seizures   [] History of stroke   [] History of TIA  [] Aphasia   [] Vissual changes   [] Weakness or numbness in arm   [] Weakness or numbness in leg Musculoskeletal:   [] Joint swelling   [] Joint pain   [] Low back pain Hematologic:  [] Easy bruising  [] Easy bleeding   [] Hypercoagulable state   [] Anemic Gastrointestinal:  [] Diarrhea   [] Vomiting  [] Gastroesophageal reflux/heartburn   [] Difficulty swallowing. Genitourinary:  [] Chronic kidney disease   [] Difficult urination  [] Frequent urination   [] Blood in urine Skin:  [] Rashes   [] Ulcers  Psychological:  [] History of anxiety   []  History of major depression.  Physical Examination  Vitals:   12/27/16 0956  BP: 115/68  Pulse: 85  Resp: 16  Weight: 44.5 kg (98 lb)   Body mass index is 16.31 kg/m. Gen: WD/WN, NAD Head: /AT, No temporalis wasting.  Ear/Nose/Throat: Hearing grossly intact, nares w/o erythema or drainage Eyes: PER, EOMI, sclera nonicteric.  Neck: Supple, no large masses.   Pulmonary:  Good air movement, no audible wheezing bilaterally, no use of accessory muscles.  Cardiac: RRR, no JVD Vascular:  Vessel Right Left  Radial Palpable Palpable  Ulnar Palpable Palpable  Brachial Palpable Palpable  Carotid Palpable Palpable  Femoral Palpable Palpable  Popliteal Palpable Palpable  PT Palpable Palpable  DP Palpable Palpable  Gastrointestinal: Non-distended. No guarding/no peritoneal signs.  Musculoskeletal: M/S 5/5 throughout.  No deformity or atrophy.  Neurologic: CN 2-12 intact. Symmetrical.  Speech is fluent. Motor exam as listed above. Psychiatric: Judgment intact, Mood & affect appropriate for pt's clinical situation. Dermatologic: No rashes or ulcers noted.  No changes consistent with cellulitis. Lymph : No  lichenification or skin changes of chronic lymphedema.  CBC Lab Results  Component Value Date   WBC 6.6 08/09/2016   HGB 16.6 (H) 08/09/2016   HCT 48.0 (H) 08/09/2016   MCV 97.8 08/09/2016   PLT 171 08/09/2016    BMET    Component Value Date/Time   NA 137 08/09/2016 1011   K 3.0 (L) 08/09/2016 1011   CL 96 (L) 08/09/2016 1011   CO2 30 08/09/2016 1011   GLUCOSE 88 08/09/2016 1011   BUN 15 08/09/2016 1011   CREATININE 0.81 08/09/2016 1011   CALCIUM 8.7 (L) 08/09/2016 1011   GFRNONAA >60 08/09/2016 1011   GFRAA >60 08/09/2016 1011   CrCl cannot be calculated (Patient's most recent lab result is older than the maximum 21 days allowed.).  COAG No results found for: INR, PROTIME  Radiology No results found.  Assessment/Plan 1. PAD (peripheral artery disease) (HCC)  Recommend:  The patient has evidence of atherosclerosis of the lower extremities with claudication.  The patient does not voice lifestyle limiting changes at  this point in time.  Noninvasive studies do not suggest clinically significant change.  No invasive studies, angiography or surgery at this time The patient should continue walking and begin a more formal exercise program.  The patient should continue antiplatelet therapy and aggressive treatment of the lipid abnormalities  No changes in the patient's medications at this time  The patient should continue wearing graduated compression socks 10-15 mmHg strength to control the mild edema.    2. Bilateral carotid artery stenosis Recommend:  Given the patient's asymptomatic subcritical stenosis no further invasive testing or surgery at this time.  Duplex ultrasound shows 1-39% RICA and 42-59% LICA stenosis bilaterally.  Continue antiplatelet therapy as prescribed Continue management of CAD, HTN and Hyperlipidemia Healthy heart diet,  encouraged exercise at least 4 times per week Follow up in 6 months with duplex ultrasound and physical exam based on  >50% stenosis of the LICA carotid artery   3. Mixed hyperlipidemia Continue statin as ordered and reviewed, no changes at this time   4. Chronic obstructive pulmonary disease, unspecified COPD type ([place]) Continue pulmonary medications and aerosols as already ordered, these medications have been reviewed and there are no changes at this time.      Hortencia Pilar, MD  12/27/2016 10:11 AM

## 2017-01-28 ENCOUNTER — Ambulatory Visit (INDEPENDENT_AMBULATORY_CARE_PROVIDER_SITE_OTHER): Payer: Managed Care, Other (non HMO) | Admitting: Vascular Surgery

## 2017-01-28 ENCOUNTER — Encounter (INDEPENDENT_AMBULATORY_CARE_PROVIDER_SITE_OTHER): Payer: Managed Care, Other (non HMO)

## 2017-05-27 ENCOUNTER — Encounter (INDEPENDENT_AMBULATORY_CARE_PROVIDER_SITE_OTHER): Payer: Self-pay

## 2017-05-27 ENCOUNTER — Other Ambulatory Visit (INDEPENDENT_AMBULATORY_CARE_PROVIDER_SITE_OTHER): Payer: Self-pay | Admitting: Vascular Surgery

## 2017-05-27 ENCOUNTER — Other Ambulatory Visit (INDEPENDENT_AMBULATORY_CARE_PROVIDER_SITE_OTHER): Payer: 59

## 2017-05-27 ENCOUNTER — Ambulatory Visit (INDEPENDENT_AMBULATORY_CARE_PROVIDER_SITE_OTHER): Payer: 59 | Admitting: Vascular Surgery

## 2017-05-27 ENCOUNTER — Encounter (INDEPENDENT_AMBULATORY_CARE_PROVIDER_SITE_OTHER): Payer: Self-pay | Admitting: Vascular Surgery

## 2017-05-27 VITALS — BP 100/64 | HR 88 | Resp 16 | Ht 64.0 in | Wt 98.0 lb

## 2017-05-27 DIAGNOSIS — I6523 Occlusion and stenosis of bilateral carotid arteries: Secondary | ICD-10-CM

## 2017-05-27 DIAGNOSIS — M79606 Pain in leg, unspecified: Secondary | ICD-10-CM | POA: Insufficient documentation

## 2017-05-27 DIAGNOSIS — E782 Mixed hyperlipidemia: Secondary | ICD-10-CM | POA: Diagnosis not present

## 2017-05-27 DIAGNOSIS — I739 Peripheral vascular disease, unspecified: Secondary | ICD-10-CM | POA: Diagnosis not present

## 2017-05-27 DIAGNOSIS — M79605 Pain in left leg: Secondary | ICD-10-CM | POA: Diagnosis not present

## 2017-05-27 DIAGNOSIS — J449 Chronic obstructive pulmonary disease, unspecified: Secondary | ICD-10-CM

## 2017-05-27 DIAGNOSIS — M79604 Pain in right leg: Secondary | ICD-10-CM

## 2017-05-27 MED ORDER — CEFAZOLIN SODIUM-DEXTROSE 2-4 GM/100ML-% IV SOLN
2.0000 g | Freq: Once | INTRAVENOUS | Status: AC
  Administered 2017-05-28: 1 g via INTRAVENOUS

## 2017-05-27 NOTE — Progress Notes (Signed)
MRN : [id number]  Wendy Ferrell is a 61 y.o. (May 14, 1956) female who presents with chief complaint of No chief complaint on file. Marland Kitchen  History of Present Illness:   The patient returns to the office for followup and review of the noninvasive studies. There has been a significant deterioration in the lower extremity symptoms.  The patient notes interval shortening of their claudication distance and development of mild rest pain symptoms. No new ulcers or wounds have occurred since the last visit.  There have been no significant changes to the patient's overall health care.  The patient denies amaurosis fugax or recent TIA symptoms. There are no recent neurological changes noted. The patient denies history of DVT, PE or superficial thrombophlebitis. The patient denies recent episodes of angina or shortness of breath.   ABI Rt=0.68 and Lt=0.93  (previous ABI's Rt=0.82 and Lt=0.96) Duplex ultrasound of the Duplex US of the distal aorta and bilateral common iliac arteries shows moderate restenosis with peak systolic velocities of 268-341 cm/s similar to previous exam on the left but the right common iliac is much worse with PSV 479 cm/sec  Previous Carotid duplex shows 1-39% RICA and 96-22% LICA no significant change compared to last study  No outpatient prescriptions have been marked as taking for the 05/27/17 encounter (Appointment) with Delana Meyer, Dolores Lory, MD.    Past Medical History:  Diagnosis Date  . COPD (chronic obstructive pulmonary disease) ([place])   . Hypertension   . Nicotine addiction   . Peripheral vascular disease [facility])     Past Surgical History:  Procedure Laterality Date  . ABDOMINAL HYSTERECTOMY    . COLONOSCOPY WITH PROPOFOL N/A 02/21/2016   Procedure: COLONOSCOPY WITH PROPOFOL;  Surgeon: Lollie Sails, MD;  Location: [facility] ENDOSCOPY;  Service: Endoscopy;  Laterality: N/A;  . MASTOIDECTOMY    . NECK SURGERY    . PERIPHERAL VASCULAR CATHETERIZATION Left 05/29/2016    Procedure: Lower Extremity Angiography;  Surgeon: Katha Cabal, MD;  Location: [place] CV LAB;  Service: Cardiovascular;  Laterality: Left;  . PERIPHERAL VASCULAR CATHETERIZATION  05/29/2016   Procedure: Lower Extremity Intervention;  Surgeon: Katha Cabal, MD;  Location: [place] CV LAB;  Service: Cardiovascular;;  . TONSILLECTOMY      Social History Social History  Substance Use Topics  . Smoking status: Former Smoker    Packs/day: 0.50    Years: 45.00    Types: Cigarettes  . Smokeless tobacco: Never Used  . Alcohol use No    Family History Family History  Problem Relation Age of Onset  . COPD Mother   . Hypertension Mother   . COPD Father   . Breast cancer Maternal Grandmother 60  . Breast cancer Cousin 50       maternal side    No Known Allergies   REVIEW OF SYSTEMS (Negative unless checked)  Constitutional: [] Weight loss  [] Fever  [] Chills Cardiac: [] Chest pain   [] Chest pressure   [] Palpitations   [] Shortness of breath when laying flat   [] Shortness of breath with exertion. Vascular:  [x] Pain in legs with walking   [] Pain in legs at rest  [] History of DVT   [] Phlebitis   [] Swelling in legs   [] Varicose veins   [] Non-healing ulcers Pulmonary:   [] Uses home oxygen   [] Productive cough   [] Hemoptysis   [] Wheeze  [] COPD   [] Asthma Neurologic:  [] Dizziness   [] Seizures   [] History of stroke   [] History of TIA  [] Aphasia   [] Vissual  changes   [] Weakness or numbness in arm   [] Weakness or numbness in leg Musculoskeletal:   [] Joint swelling   [] Joint pain   [x] Low back pain Hematologic:  [] Easy bruising  [] Easy bleeding   [] Hypercoagulable state   [] Anemic Gastrointestinal:  [] Diarrhea   [] Vomiting  [] Gastroesophageal reflux/heartburn   [] Difficulty swallowing. Genitourinary:  [] Chronic kidney disease   [] Difficult urination  [] Frequent urination   [] Blood in urine Skin:  [] Rashes   [] Ulcers  Psychological:  [] History of anxiety   []  History of major  depression.  Physical Examination  There were no vitals filed for this visit. There is no height or weight on file to calculate BMI. Gen: WD/WN, NAD Head: [place]/AT, No temporalis wasting.  Ear/Nose/Throat: Hearing grossly intact, nares w/o erythema or drainage Eyes: PER, EOMI, sclera nonicteric.  Neck: Supple, no large masses.   Pulmonary:  Good air movement, no audible wheezing bilaterally, no use of accessory muscles.  Cardiac: RRR, no JVD Vascular:  Vessel Right Left  Radial Palpable Palpable  Femoral Not Palpable 1+ Palpable  Popliteal Not Palpable Trace Palpable  PT Not Palpable Trace Palpable  DP Not Palpable Trace Palpable  Gastrointestinal: Non-distended. No guarding/no peritoneal signs.  Musculoskeletal: M/S 5/5 throughout.  No deformity or atrophy.  Neurologic: CN 2-12 intact. Symmetrical.  Speech is fluent. Motor exam as listed above. Psychiatric: Judgment intact, Mood & affect appropriate for pt's clinical situation. Dermatologic: No rashes or ulcers noted.  No changes consistent with cellulitis. Lymph : No lichenification or skin changes of chronic lymphedema.  CBC Lab Results  Component Value Date   WBC 6.6 08/09/2016   HGB 16.6 (H) 08/09/2016   HCT 48.0 (H) 08/09/2016   MCV 97.8 08/09/2016   PLT 171 08/09/2016    BMET    Component Value Date/Time   NA 137 08/09/2016 1011   K 3.0 (L) 08/09/2016 1011   CL 96 (L) 08/09/2016 1011   CO2 30 08/09/2016 1011   GLUCOSE 88 08/09/2016 1011   BUN 15 08/09/2016 1011   CREATININE 0.81 08/09/2016 1011   CALCIUM 8.7 (L) 08/09/2016 1011   GFRNONAA >60 08/09/2016 1011   GFRAA >60 08/09/2016 1011   CrCl cannot be calculated (Patient's most recent lab result is older than the maximum 21 days allowed.).  COAG No results found for: INR, PROTIME  Radiology No results found.  Assessment/Plan 1. Pain in both lower extremities Right >> Left Recommend:  The patient has experienced increased symptoms and is now  describing lifestyle limiting claudication and mild rest pain of the right leg.   Given the severity of the patient's right lower extremity symptoms the patient should undergo right leg angiography and intervention.  Risk and benefits were reviewed the patient.  Indications for the procedure were reviewed.  All questions were answered, the patient agrees to proceed.   The patient should continue walking and begin a more formal exercise program.  The patient should continue antiplatelet therapy and aggressive treatment of the lipid abnormalities  The patient will follow up with me after the angiogram.    A total of 35 minutes was spent with this patient and greater than 50% was spent in counseling and coordination of care with the patient.  Discussion included the treatment options for vascular disease including indications for surgery and intervention.  Also discussed is the appropriate timing of treatment.  In addition medical therapy was discussed.  2. PAD (peripheral artery disease) ([place]) See #1  3. Bilateral carotid artery stenosis Recommend:  Given the patient's asymptomatic subcritical stenosis no further invasive testing or surgery at this time.  Continue antiplatelet therapy as prescribed Continue management of CAD, HTN and Hyperlipidemia Healthy heart diet,  encouraged exercise at least 4 times per week Follow up in 6 months with duplex ultrasound and physical exam   4. Mixed hyperlipidemia Continue statin as ordered and reviewed, no changes at this time   5. Chronic obstructive pulmonary disease, unspecified COPD type ([place]) Continue pulmonary medications and aerosols as already ordered, these medications have been reviewed and there are no changes at this time.     Hortencia Pilar, MD  05/27/2017 8:12 AM

## 2017-05-28 ENCOUNTER — Ambulatory Visit
Admission: RE | Admit: 2017-05-28 | Discharge: 2017-05-28 | Disposition: A | Discharge status: Home / Self Care | Payer: 59 | Source: Ambulatory Visit | Attending: Vascular Surgery | Admitting: Vascular Surgery

## 2017-05-28 ENCOUNTER — Other Ambulatory Visit
Admission: RE | Admit: 2017-05-28 | Discharge: 2017-05-28 | Disposition: A | Discharge status: Home / Self Care | Payer: 59 | Source: Ambulatory Visit | Attending: Vascular Surgery | Admitting: Vascular Surgery

## 2017-05-28 ENCOUNTER — Encounter
Admission: RE | Disposition: A | Discharge status: Home / Self Care | Payer: Self-pay | Source: Ambulatory Visit | Attending: Vascular Surgery

## 2017-05-28 DIAGNOSIS — Z90710 Acquired absence of both cervix and uterus: Secondary | ICD-10-CM | POA: Insufficient documentation

## 2017-05-28 DIAGNOSIS — I6523 Occlusion and stenosis of bilateral carotid arteries: Secondary | ICD-10-CM | POA: Diagnosis not present

## 2017-05-28 DIAGNOSIS — Z8249 Family history of ischemic heart disease and other diseases of the circulatory system: Secondary | ICD-10-CM | POA: Diagnosis not present

## 2017-05-28 DIAGNOSIS — I70213 Atherosclerosis of native arteries of extremities with intermittent claudication, bilateral legs: Secondary | ICD-10-CM | POA: Insufficient documentation

## 2017-05-28 DIAGNOSIS — I10 Essential (primary) hypertension: Secondary | ICD-10-CM | POA: Insufficient documentation

## 2017-05-28 DIAGNOSIS — J449 Chronic obstructive pulmonary disease, unspecified: Secondary | ICD-10-CM | POA: Diagnosis not present

## 2017-05-28 DIAGNOSIS — Z9889 Other specified postprocedural states: Secondary | ICD-10-CM | POA: Diagnosis not present

## 2017-05-28 DIAGNOSIS — E785 Hyperlipidemia, unspecified: Secondary | ICD-10-CM | POA: Diagnosis not present

## 2017-05-28 DIAGNOSIS — Z87891 Personal history of nicotine dependence: Secondary | ICD-10-CM | POA: Insufficient documentation

## 2017-05-28 DIAGNOSIS — Z836 Family history of other diseases of the respiratory system: Secondary | ICD-10-CM | POA: Insufficient documentation

## 2017-05-28 DIAGNOSIS — Z803 Family history of malignant neoplasm of breast: Secondary | ICD-10-CM | POA: Diagnosis not present

## 2017-05-28 HISTORY — PX: LOWER EXTREMITY ANGIOGRAPHY: CATH118251

## 2017-05-28 LAB — CREATININE, SERUM
CREATININE: 1.26 mg/dL — AB (ref 0.44–1.00)
GFR calc Af Amer: 53 mL/min — ABNORMAL LOW (ref >60)
GFR, EST NON AFRICAN AMERICAN: 46 mL/min — AB (ref >60)

## 2017-05-28 LAB — POCT ACTIVATED CLOTTING TIME: Activated Clotting Time: 191 seconds

## 2017-05-28 LAB — BUN: BUN: 23 mg/dL — AB (ref 6–20)

## 2017-05-28 SURGERY — LOWER EXTREMITY ANGIOGRAPHY
Anesthesia: Moderate Sedation | Laterality: Right

## 2017-05-28 MED ORDER — CLOPIDOGREL BISULFATE 300 MG PO TABS
300.0000 mg | ORAL_TABLET | Freq: Once | ORAL | Status: AC
  Administered 2017-05-28: 300 mg via ORAL

## 2017-05-28 MED ORDER — CLOPIDOGREL BISULFATE 75 MG PO TABS
ORAL_TABLET | ORAL | Status: AC
  Administered 2017-05-28: 300 mg via ORAL
  Filled 2017-05-28: qty 4

## 2017-05-28 MED ORDER — SODIUM CHLORIDE 0.9 % IV SOLN
INTRAVENOUS | Status: DC
Start: 2017-05-28 — End: 2017-05-28
  Administered 2017-05-28: 12:00:00 via INTRAVENOUS

## 2017-05-28 MED ORDER — LABETALOL HCL 5 MG/ML IV SOLN: 10.0000 mg | INTRAVENOUS | Status: DC | PRN

## 2017-05-28 MED ORDER — SODIUM CHLORIDE 0.9% FLUSH: 3.0000 mL | INTRAVENOUS | Status: DC | PRN

## 2017-05-28 MED ORDER — HYDRALAZINE HCL 20 MG/ML IJ SOLN: 5.0000 mg | INTRAMUSCULAR | Status: DC | PRN

## 2017-05-28 MED ORDER — METHYLPREDNISOLONE SODIUM SUCC 125 MG IJ SOLR: 125.0000 mg | INTRAMUSCULAR | Status: DC | PRN

## 2017-05-28 MED ORDER — IOPAMIDOL (ISOVUE-300) INJECTION 61%
INTRAVENOUS | Status: DC | PRN
  Administered 2017-05-28: 75 mL via INTRA_ARTERIAL

## 2017-05-28 MED ORDER — LIDOCAINE HCL (PF) 1 % IJ SOLN
INTRAMUSCULAR | Status: AC
  Filled 2017-05-28: qty 30

## 2017-05-28 MED ORDER — MORPHINE SULFATE (PF) 4 MG/ML IV SOLN: 2.0000 mg | INTRAVENOUS | Status: DC | PRN

## 2017-05-28 MED ORDER — FENTANYL CITRATE (PF) 100 MCG/2ML IJ SOLN
INTRAMUSCULAR | Status: AC
  Filled 2017-05-28: qty 2

## 2017-05-28 MED ORDER — HYDROMORPHONE HCL 1 MG/ML IJ SOLN: 1.0000 mg | Freq: Once | INTRAMUSCULAR | Status: DC | PRN

## 2017-05-28 MED ORDER — HEPARIN SODIUM (PORCINE) 1000 UNIT/ML IJ SOLN
INTRAMUSCULAR | Status: AC
  Filled 2017-05-28: qty 1

## 2017-05-28 MED ORDER — ONDANSETRON HCL 4 MG/2ML IJ SOLN: 4.0000 mg | Freq: Four times a day (QID) | INTRAMUSCULAR | Status: DC | PRN

## 2017-05-28 MED ORDER — HEPARIN (PORCINE) IN NACL 2-0.9 UNIT/ML-% IJ SOLN
INTRAMUSCULAR | Status: AC
Start: 2017-05-28 — End: 2017-05-28
  Filled 2017-05-28: qty 1000

## 2017-05-28 MED ORDER — SODIUM CHLORIDE 0.9 % IV SOLN: INTRAVENOUS | Status: DC

## 2017-05-28 MED ORDER — SODIUM CHLORIDE 0.9 % IV SOLN
INTRAVENOUS | Status: AC | PRN
  Administered 2017-05-28: 250 mL via INTRAVENOUS

## 2017-05-28 MED ORDER — FAMOTIDINE 20 MG PO TABS: 40.0000 mg | ORAL_TABLET | ORAL | Status: DC | PRN

## 2017-05-28 MED ORDER — MIDAZOLAM HCL 2 MG/2ML IJ SOLN
INTRAMUSCULAR | Status: DC | PRN
  Administered 2017-05-28 (×3): 1 mg via INTRAVENOUS
  Administered 2017-05-28: 2 mg via INTRAVENOUS

## 2017-05-28 MED ORDER — OXYCODONE HCL 5 MG PO TABS: 5.0000 mg | ORAL_TABLET | ORAL | Status: DC | PRN

## 2017-05-28 MED ORDER — SODIUM CHLORIDE 0.9% FLUSH: 3.0000 mL | Freq: Two times a day (BID) | INTRAVENOUS | Status: DC

## 2017-05-28 MED ORDER — CEFAZOLIN SODIUM-DEXTROSE 1-4 GM/50ML-% IV SOLN
INTRAVENOUS | Status: AC
  Filled 2017-05-28: qty 100

## 2017-05-28 MED ORDER — SODIUM CHLORIDE 0.9 % IV SOLN: 250.0000 mL | INTRAVENOUS | Status: DC | PRN

## 2017-05-28 MED ORDER — FENTANYL CITRATE (PF) 100 MCG/2ML IJ SOLN
INTRAMUSCULAR | Status: DC | PRN
  Administered 2017-05-28: 25 ug via INTRAVENOUS
  Administered 2017-05-28 (×2): 50 ug via INTRAVENOUS
  Administered 2017-05-28: 25 ug via INTRAVENOUS

## 2017-05-28 MED ORDER — HEPARIN SODIUM (PORCINE) 1000 UNIT/ML IJ SOLN
INTRAMUSCULAR | Status: DC | PRN
  Administered 2017-05-28: 4000 [IU] via INTRAVENOUS

## 2017-05-28 MED ORDER — MIDAZOLAM HCL 5 MG/5ML IJ SOLN
INTRAMUSCULAR | Status: AC
  Filled 2017-05-28: qty 5

## 2017-05-28 SURGICAL SUPPLY — 23 items
BALLN ARMADA 7X20X80 (BALLOONS) ×3
BALLN LUTONIX 5X120X130 (BALLOONS) ×3
BALLOON ARMADA 7X20X80 (BALLOONS) IMPLANT
BALLOON LUTONIX 5X120X130 (BALLOONS) IMPLANT
CATH BEACON 5 .035 65 KMP TIP (CATHETERS) ×2 IMPLANT
CATH BEACON 5 .035 65 RIM TIP (CATHETERS) ×2 IMPLANT
CATH PIG 70CM (CATHETERS) ×2 IMPLANT
DEVICE PRESTO INFLATION (MISCELLANEOUS) ×2 IMPLANT
DEVICE TORQUE .025-.038 (MISCELLANEOUS) ×2 IMPLANT
GUIDEWIRE LT ZIPWIRE 035X260 (WIRE) ×2 IMPLANT
NDL ENTRY 21GA 7CM ECHOTIP (NEEDLE) IMPLANT
NEEDLE ENTRY 21GA 7CM ECHOTIP (NEEDLE) ×3 IMPLANT
PACK ANGIOGRAPHY (CUSTOM PROCEDURE TRAY) ×3 IMPLANT
SET INTRO CAPELLA COAXIAL (SET/KITS/TRAYS/PACK) ×2 IMPLANT
SHEATH BALKIN 7FR (SHEATH) ×2 IMPLANT
SHEATH BRITE TIP 5FRX11 (SHEATH) ×2 IMPLANT
SHEATH BRITE TIP 7FRX11 (SHEATH) ×2 IMPLANT
STENT VIABAHN 7X29X80 VBX (Permanent Stent) ×2 IMPLANT
SYR MEDRAD MARK V 150ML (SYRINGE) ×2 IMPLANT
TUBING CONTRAST HIGH PRESS 72 (TUBING) ×2 IMPLANT
WIRE HI TORQ VERSACORE 300 (WIRE) ×2 IMPLANT
WIRE J 3MM .035X145CM (WIRE) ×2 IMPLANT
WIRE MAGIC TOR.035 180C (WIRE) ×2 IMPLANT

## 2017-05-28 NOTE — H&P (Signed)
[facility] History & Physical Update  The patient was interviewed and re-examined.  The patient's previous History and Physical has been reviewed and is unchanged.  There is no change in the plan of care. We plan to proceed with the scheduled procedure.  Hortencia Pilar, MD  05/28/2017, 12:47 PM

## 2017-05-28 NOTE — Progress Notes (Signed)
Patient already had appointment for 07/01/2017. Will keep that appointment.

## 2017-05-28 NOTE — Op Note (Signed)
OPERATIVE NOTE   PROCEDURE: 1. Right lower extremity angiography third order catheter placement 2. Ultrasound-guided access Left common femoral artery for sheath placement 3. Percutaneous transluminal angioplasty and stent placement to 7 mm right common iliac artery 4. Percutaneous transluminal and plasty to 5 mm with the Lutonix drug-eluting balloon right external iliac artery 5. Percutaneous transluminal angioplasty left common iliac artery to 7 mm  PRE-OPERATIVE DIAGNOSIS: Atherosclerotic occlusive disease bilateral lower extremities with recurrent short distance claudication.  POST-OPERATIVE DIAGNOSIS: Same  SURGEON: Katha Cabal, M.D.  ANESTHESIA: Conscious sedation was administered under my direct supervision by the interventional radiology RN. IV Versed plus fentanyl were utilized. Continuous ECG, pulse oximetry and blood pressure was monitored throughout the entire procedure.  Conscious sedation was for a total of 57 minutes.  ESTIMATED BLOOD LOSS: Minimal cc  FINDING(S): 1.  Diffuse atherosclerotic changes of the bilateral lower extremity, ultrasound evidence of high-grade stenosis just distal to the right common iliac artery stent  SPECIMEN(S):  None  INDICATIONS:   Wendy Ferrell is a 61 y.o. y.o. female who presents with lifestyle limiting claudication. She has nonpalpable pulses in association with deterioration of her noninvasive studies.  The risks and benefits for angiography and intervention have been reviewed with the patient all questions have been answered alternative therapies were discussed patient agrees to proceed  DESCRIPTION: After obtaining full informed written consent, the patient was brought back to the operating room and placed supine upon the operating table.  The patient received IV antibiotics prior to induction.  After obtaining adequate sedation, the patient was prepped and draped in the standard fashion and appropriate time out is called.     Ultrasound is placed in a sterile sleeve ultrasound as utilized secondary to lack of appropriate landmarks and to avoid vascular injury. Under real-time visualization the left common femoral artery is identified is echolucent and pulsatile indicating patency. Image is recorded for the permanent record. 1% lidocaine is then infiltrated into the soft tissues with ultrasound visualization. Microneedle is then inserted into the anterior wall of the common femoral artery under direct visualization with ultrasound. Microwire followed by micro-sheath.   J-wire followed by 6 French sheath is then inserted without difficulty.  J-wire is then advanced with pigtail catheter up to the level of T12 and AP projection of the aorta is obtained. Pigtail catheter was repositioned to above the bifurcation and oblique view of the pelvis is obtained.  Using a Stiff angled Glide Wire wire and the William catheter the aortic bifurcation was crossed and the catheter is advanced down to the external iliac artery. Oblique view of theright femoral bifurcation is then obtained by hand injection. The wire is then reintroduced and the catheter is positioned within the SFA. AP projections of the right lower extremity are obtained for distal runoff.  4000 units of heparin was then given and allowed to circulate for several minutes area a versa core wires then introduced and negotiated down to the level of the popliteal and subsequently a 7 [place] sheath is advanced up and over the bifurcation and positioned in the mid right common iliac artery stent.  Magnified image of the stenosis in the distal common iliac is then made Procrit measurements are performed and a 7 mm x 26 mm VBX Gore stent is selected and prepped on the field. It is advanced across the distal common iliac lesion and deployed without difficulty. Follow-up imaging demonstrates a well opposed stent and complete resolution of the stenosis. Magnified imaging of  the  right external iliac artery demonstrates a string sign in its proximal one third with diffuse narrowing throughout it's course. Therefore, a 5 mm I 120 mm Lutonix balloon is advanced across the external iliac and inflated to 12 atm for 2 minutes. Follow-up imaging demonstrates a widely patent artery with less than 5% residual stenosis. The distal runoff is then reassessed and noted to be unchanged.  During positioning a of the common iliac stent magnified imaging of the aortic bifurcation did note  60% narrowing of the proximal portion of the left common iliac stent. This is also identified in the noninvasive studies with proximal stent philosophies on the left of greater than 260 cm/sec.  Therefore, a 7 mm x 20 mm ultra versed balloon is advanced across this area and inflated to 14 atm for 1 minute, follow-up imaging demonstrated resolution of the stenosis within the common iliac, less than 5% residual stenosis, no focal hemodynamically significant lesion was identified in the left external iliac  After review of the images the catheter is removed sheath is pulled back into the left iliac system J-wire is then advanced and an LAO projection of the groin is obtained after review of this image ACT is obtained which was 190 and therefore the sheath was pulled and held as the left common femoral was not appropriate for closure device.  INTERPRETATION: The abdominal aorta is opacified with a bolus injection of contrast. There is diffuse atherosclerotic changes clearly visible even under fluoroscopy without contrast enhancement. However, there are no hemodynamically significant lesions noted within the aorta, there is a 85-90% stenosis in the distal right common iliac with diffuse narrowing of the right external iliac and a focal string sign in the proximal one third of the right external iliac.  The right common femoral and profunda femoris arteries are patent, although the common femoral is somewhat small. The  SFA and popliteal are patent and the trifurcation appears patent.  On the left there is a 60%stenosis in the proximal common iliac within the stented area. This does correlate with an area of markedly elevated velocities on duplex ultrasound. The remaining portion of the left common iliac is widely patent as is the external iliac artery on the left. The left common femoral is fairly diseased very small and inappropriate for closure device.  Following and plasty and stent placement there is total resolution of the narrowing within the right common iliac. Following angioplasty with a drug-eluting balloon there is resolution of the hemodynamically significant lesions in the right external iliac with a marked improvement in flow. Following angioplasty of the left common iliac there is resolution of this area as well with less than 5% residual stenosis  SUMMARY:  Successful revascularization of the right lower extremity with successful treatment of an in-stent restenosis on the left   COMPLICATIONS: There were no immediate consultations.  CONDITION: Wendy Ferrell, M.D. [facility] Office: [phone]   05/28/2017,3:32 PM

## 2017-05-29 ENCOUNTER — Encounter: Payer: Self-pay | Admitting: Vascular Surgery

## 2017-05-31 ENCOUNTER — Encounter (INDEPENDENT_AMBULATORY_CARE_PROVIDER_SITE_OTHER): Payer: Self-pay | Admitting: Vascular Surgery

## 2017-06-26 ENCOUNTER — Encounter (INDEPENDENT_AMBULATORY_CARE_PROVIDER_SITE_OTHER): Payer: 59

## 2017-06-26 ENCOUNTER — Ambulatory Visit (INDEPENDENT_AMBULATORY_CARE_PROVIDER_SITE_OTHER): Payer: 59 | Admitting: Vascular Surgery

## 2017-07-01 ENCOUNTER — Ambulatory Visit (INDEPENDENT_AMBULATORY_CARE_PROVIDER_SITE_OTHER): Payer: 59

## 2017-07-01 ENCOUNTER — Other Ambulatory Visit (INDEPENDENT_AMBULATORY_CARE_PROVIDER_SITE_OTHER): Payer: Self-pay | Admitting: Vascular Surgery

## 2017-07-01 ENCOUNTER — Ambulatory Visit (INDEPENDENT_AMBULATORY_CARE_PROVIDER_SITE_OTHER): Payer: 59 | Admitting: Vascular Surgery

## 2017-07-01 ENCOUNTER — Encounter (INDEPENDENT_AMBULATORY_CARE_PROVIDER_SITE_OTHER): Payer: Self-pay | Admitting: Vascular Surgery

## 2017-07-01 VITALS — BP 120/70 | HR 68 | Resp 14 | Ht 64.0 in | Wt 97.0 lb

## 2017-07-01 DIAGNOSIS — J449 Chronic obstructive pulmonary disease, unspecified: Secondary | ICD-10-CM | POA: Diagnosis not present

## 2017-07-01 DIAGNOSIS — E782 Mixed hyperlipidemia: Secondary | ICD-10-CM | POA: Diagnosis not present

## 2017-07-01 DIAGNOSIS — Z9862 Peripheral vascular angioplasty status: Secondary | ICD-10-CM

## 2017-07-01 DIAGNOSIS — I6523 Occlusion and stenosis of bilateral carotid arteries: Secondary | ICD-10-CM | POA: Diagnosis not present

## 2017-07-01 DIAGNOSIS — I70213 Atherosclerosis of native arteries of extremities with intermittent claudication, bilateral legs: Secondary | ICD-10-CM

## 2017-07-01 NOTE — Progress Notes (Signed)
MRN : [id number]  Wendy Ferrell is a 61 y.o. (12/02/1955) female who presents with chief complaint of  Chief Complaint  Patient presents with  . Carotid    6 month u/s follow up  .  History of Present Illness: The patient returns to the office for followup and review of the noninvasive studies. There have been no interval changes in lower extremity symptoms. No interval shortening of the patient's claudication distance or development of rest pain symptoms. No new ulcers or wounds have occurred since the last visit.  The patient is also seen for follow up evaluation of carotid stenosis. The carotid stenosis followed by ultrasound.   The patient denies amaurosis fugax. There is no recent history of TIA symptoms or focal motor deficits. There is no prior documented CVA.  The patient is taking enteric-coated aspirin 81 mg daily.  There is no history of migraine headaches. There is no history of seizures.  There have been no significant changes to the patient's overall health care.  The patient denies history of DVT, PE or superficial thrombophlebitis.  The patient denies recent episodes of angina or shortness of breath.   ABI Rt=0.94 and Lt=1.04  (previous ABI's Rt=0.68 and Lt=0.93) Duplex ultrasound of the aorta iliac arteries shows the stents are widely patent  Carotid Duplex done today shows RICA <32% and LICA 35-57%.  No change compared to last study in April 2018   Current Meds  Medication Sig  . aspirin 81 MG chewable tablet Chew by mouth daily.  Marland Kitchen atorvastatin (LIPITOR) 40 MG tablet Take 40 mg by mouth daily.  . budesonide-formoterol (SYMBICORT) 80-4.5 MCG/ACT inhaler Inhale 2 puffs into the lungs 2 (two) times daily.  . celecoxib (CELEBREX) 200 MG capsule Take 200-400 mg by mouth at bedtime.   . chlorthalidone (HYGROTON) 25 MG tablet Take 12.5 mg by mouth daily.  . clopidogrel (PLAVIX) 75 MG tablet Take 75 mg by mouth daily.  . COMBIVENT RESPIMAT 20-100 MCG/ACT AERS  respimat Inhale 1 puff into the lungs 3 (three) times daily.   . cyclobenzaprine (FLEXERIL) 5 MG tablet Take 5 mg by mouth 2 (two) times daily.   Marland Kitchen ibuprofen (ADVIL,MOTRIN) 200 MG tablet Take 200 mg by mouth every 6 (six) hours as needed for moderate pain.  Marland Kitchen losartan (COZAAR) 25 MG tablet Take 25 mg by mouth daily.  Marland Kitchen umeclidinium-vilanterol (ANORO ELLIPTA) 62.5-25 MCG/INH AEPB Inhale 1 puff into the lungs daily.    Past Medical History:  Diagnosis Date  . COPD (chronic obstructive pulmonary disease) ([place])   . Hypertension   . Nicotine addiction   . Peripheral vascular disease [facility])     Past Surgical History:  Procedure Laterality Date  . ABDOMINAL HYSTERECTOMY    . COLONOSCOPY WITH PROPOFOL N/A 02/21/2016   Procedure: COLONOSCOPY WITH PROPOFOL;  Surgeon: Lollie Sails, MD;  Location: [facility] ENDOSCOPY;  Service: Endoscopy;  Laterality: N/A;  . LOWER EXTREMITY ANGIOGRAPHY Right 05/28/2017   Procedure: Lower Extremity Angiography;  Surgeon: Katha Cabal, MD;  Location: [place] CV LAB;  Service: Cardiovascular;  Laterality: Right;  . MASTOIDECTOMY    . NECK SURGERY    . PERIPHERAL VASCULAR CATHETERIZATION Left 05/29/2016   Procedure: Lower Extremity Angiography;  Surgeon: Katha Cabal, MD;  Location: [place] CV LAB;  Service: Cardiovascular;  Laterality: Left;  . PERIPHERAL VASCULAR CATHETERIZATION  05/29/2016   Procedure: Lower Extremity Intervention;  Surgeon: Katha Cabal, MD;  Location: [place] CV LAB;  Service: Cardiovascular;;  .  TONSILLECTOMY      Social History Social History  Substance Use Topics  . Smoking status: Former Smoker    Packs/day: 0.50    Years: 45.00    Types: Cigarettes  . Smokeless tobacco: Never Used  . Alcohol use No    Family History Family History  Problem Relation Age of Onset  . COPD Mother   . Hypertension Mother   . COPD Father   . Breast cancer Maternal Grandmother 60  . Breast cancer Cousin 50        maternal side    No Known Allergies   REVIEW OF SYSTEMS (Negative unless checked)  Constitutional: [] Weight loss  [] Fever  [] Chills Cardiac: [] Chest pain   [] Chest pressure   [] Palpitations   [] Shortness of breath when laying flat   [] Shortness of breath with exertion. Vascular:  [x] Pain in legs with walking   [] Pain in legs at rest  [] History of DVT   [] Phlebitis   [] Swelling in legs   [] Varicose veins   [] Non-healing ulcers Pulmonary:   [] Uses home oxygen   [] Productive cough   [] Hemoptysis   [] Wheeze  [x] COPD   [] Asthma Neurologic:  [] Dizziness   [] Seizures   [] History of stroke   [] History of TIA  [] Aphasia   [] Vissual changes   [] Weakness or numbness in arm   [] Weakness or numbness in leg Musculoskeletal:   [] Joint swelling   [] Joint pain   [] Low back pain Hematologic:  [] Easy bruising  [] Easy bleeding   [] Hypercoagulable state   [] Anemic Gastrointestinal:  [] Diarrhea   [] Vomiting  [] Gastroesophageal reflux/heartburn   [] Difficulty swallowing. Genitourinary:  [] Chronic kidney disease   [] Difficult urination  [] Frequent urination   [] Blood in urine Skin:  [] Rashes   [] Ulcers  Psychological:  [] History of anxiety   []  History of major depression.  Physical Examination  Vitals:   07/01/17 0930 07/01/17 0931  BP: 133/71 120/70  Pulse: 68   Resp: 14   Weight: 97 lb (44 kg)   Height: 5\' 4"  (1.626 m)    Body mass index is 16.65 kg/m. Gen: WD/WN, NAD Head: [place]/AT, No temporalis wasting.  Ear/Nose/Throat: Hearing grossly intact, nares w/o erythema or drainage Eyes: PER, EOMI, sclera nonicteric.  Neck: Supple, no large masses.   Pulmonary:  Good air movement, no audible wheezing bilaterally, no use of accessory muscles.  Cardiac: RRR, no JVD, 4/6 SEM Vascular: bilateral carotid bruit noted Vessel Right Left  Radial Palpable Palpable  PT Palpable Palpable  DP Palpable Palpable  Gastrointestinal: Non-distended. No guarding/no peritoneal signs.  Musculoskeletal: M/S 5/5  throughout.  No deformity or atrophy.  Neurologic: CN 2-12 intact. Symmetrical.  Speech is fluent. Motor exam as listed above. Psychiatric: Judgment intact, Mood & affect appropriate for pt's clinical situation. Dermatologic: No rashes or ulcers noted.  No changes consistent with cellulitis. Lymph : No lichenification or skin changes of chronic lymphedema.  CBC Lab Results  Component Value Date   WBC 6.6 08/09/2016   HGB 16.6 (H) 08/09/2016   HCT 48.0 (H) 08/09/2016   MCV 97.8 08/09/2016   PLT 171 08/09/2016    BMET    Component Value Date/Time   NA 137 08/09/2016 1011   K 3.0 (L) 08/09/2016 1011   CL 96 (L) 08/09/2016 1011   CO2 30 08/09/2016 1011   GLUCOSE 88 08/09/2016 1011   BUN 23 (H) 05/28/2017 1126   CREATININE 1.26 (H) 05/28/2017 1126   CALCIUM 8.7 (L) 08/09/2016 1011   GFRNONAA 46 (L) 05/28/2017 1126  GFRAA 53 (L) 05/28/2017 1126   CrCl cannot be calculated (Patient's most recent lab result is older than the maximum 21 days allowed.).  COAG No results found for: INR, PROTIME  Radiology No results found.   Assessment/Plan 1. Atherosclerosis of native artery of both lower extremities with intermittent claudication (HCC)  Recommend:  The patient has evidence of atherosclerosis of the lower extremities with claudication.  The patient does not voice lifestyle limiting changes at this point in time.  Noninvasive studies do not suggest clinically significant change.  No invasive studies, angiography or surgery at this time The patient should continue walking and begin a more formal exercise program.  The patient should continue antiplatelet therapy and aggressive treatment of the lipid abnormalities  No changes in the patient's medications at this time  The patient should continue wearing graduated compression socks 10-15 mmHg strength to control the mild edema.   - VAS US AORTA/IVC/ILIACS; Future - VAS [place] ABI WITH/WO TBI; Future  2. Bilateral carotid  artery stenosis Recommend:  Given the patient's asymptomatic subcritical stenosis no further invasive testing or surgery at this time.  Carotid Duplex done today shows RICA <52% and LICA 77-82%.  No change compared to last study in April 2018  Continue antiplatelet therapy as prescribed Continue management of CAD, HTN and Hyperlipidemia Healthy heart diet,  encouraged exercise at least 4 times per week  Follow up in 12 months with duplex ultrasound and physical exam based on the stable >50% stenosis of the left carotid artery   - VAS US CAROTID; Future  3. Chronic obstructive pulmonary disease, unspecified COPD type ([place]) Continue pulmonary medications and aerosols as already ordered, these medications have been reviewed and there are no changes at this time.    4. Mixed hyperlipidemia Continue statin as ordered and reviewed, no changes at this time     Hortencia Pilar, MD  07/01/2017 11:40 AM

## 2017-07-03 ENCOUNTER — Other Ambulatory Visit (INDEPENDENT_AMBULATORY_CARE_PROVIDER_SITE_OTHER): Payer: Self-pay | Admitting: Cardiology

## 2017-10-16 ENCOUNTER — Other Ambulatory Visit (INDEPENDENT_AMBULATORY_CARE_PROVIDER_SITE_OTHER): Payer: Self-pay | Admitting: Vascular Surgery

## 2018-01-17 ENCOUNTER — Other Ambulatory Visit: Payer: Self-pay | Admitting: Internal Medicine

## 2018-01-17 DIAGNOSIS — Z1231 Encounter for screening mammogram for malignant neoplasm of breast: Secondary | ICD-10-CM

## 2018-02-05 ENCOUNTER — Ambulatory Visit
Admission: RE | Admit: 2018-02-05 | Discharge: 2018-02-05 | Disposition: A | Discharge status: Home / Self Care | Payer: 59 | Source: Ambulatory Visit | Attending: Internal Medicine | Admitting: Internal Medicine

## 2018-02-05 DIAGNOSIS — Z1231 Encounter for screening mammogram for malignant neoplasm of breast: Secondary | ICD-10-CM | POA: Insufficient documentation

## 2018-03-20 ENCOUNTER — Other Ambulatory Visit (INDEPENDENT_AMBULATORY_CARE_PROVIDER_SITE_OTHER): Payer: Self-pay | Admitting: Vascular Surgery

## 2018-07-07 ENCOUNTER — Ambulatory Visit (INDEPENDENT_AMBULATORY_CARE_PROVIDER_SITE_OTHER): Payer: 59

## 2018-07-07 ENCOUNTER — Ambulatory Visit (INDEPENDENT_AMBULATORY_CARE_PROVIDER_SITE_OTHER): Payer: 59 | Admitting: Vascular Surgery

## 2018-07-07 ENCOUNTER — Encounter (INDEPENDENT_AMBULATORY_CARE_PROVIDER_SITE_OTHER): Payer: Self-pay | Admitting: Vascular Surgery

## 2018-07-07 VITALS — BP 127/71 | HR 62 | Resp 17 | Ht 64.0 in | Wt 92.0 lb

## 2018-07-07 DIAGNOSIS — J449 Chronic obstructive pulmonary disease, unspecified: Secondary | ICD-10-CM

## 2018-07-07 DIAGNOSIS — I739 Peripheral vascular disease, unspecified: Secondary | ICD-10-CM | POA: Diagnosis not present

## 2018-07-07 DIAGNOSIS — E782 Mixed hyperlipidemia: Secondary | ICD-10-CM | POA: Diagnosis not present

## 2018-07-07 DIAGNOSIS — I70213 Atherosclerosis of native arteries of extremities with intermittent claudication, bilateral legs: Secondary | ICD-10-CM

## 2018-07-07 DIAGNOSIS — I6523 Occlusion and stenosis of bilateral carotid arteries: Secondary | ICD-10-CM | POA: Diagnosis not present

## 2018-07-07 DIAGNOSIS — Z7982 Long term (current) use of aspirin: Secondary | ICD-10-CM

## 2018-07-07 NOTE — Progress Notes (Signed)
MRN : [id number]  Wendy Ferrell is a 62 y.o. ([birth year]) female who presents with chief complaint of  Chief Complaint  Patient presents with  . Follow-up    1 year aorta, ABI and Carotid follow up  .  History of Present Illness:  The patient returns to the office for followup and review of the noninvasive studies. There have been no interval changes in lower extremity symptoms. No interval shortening of the patient's claudication distance or development of rest pain symptoms. No new ulcers or wounds have occurred since the last visit.  The patient is also seen for follow up evaluation of carotid stenosis. The carotid stenosis followed by ultrasound.   The patient denies amaurosis fugax. There is no recent history of TIA symptoms or focal motor deficits. There is no prior documented CVA.  The patient is taking enteric-coated aspirin 81 mg daily.  There is no history of migraine headaches. There is no history of seizures.  There have been no significant changes to the patient's overall health care.  The patient denies history of DVT, PE or superficial thrombophlebitis.  The patient denies recent episodes of angina or shortness of breath.   ABI Rt=0.99 and Lt=0.98  (previous ABI's Rt=0.94 and Lt=1.04) Duplex ultrasound of the aorta iliac arteries shows the stents are widely patent  Carotid Duplex done today shows RICA <58% and LICA 52-77%.  No change compared to last study in April 2018  Current Meds  Medication Sig  . aspirin 81 MG chewable tablet Chew by mouth daily.  Marland Kitchen atorvastatin (LIPITOR) 40 MG tablet Take 40 mg by mouth daily.  . benzonatate (TESSALON) 200 MG capsule benzonatate 200 mg capsule  . budesonide-formoterol (SYMBICORT) 80-4.5 MCG/ACT inhaler Inhale 2 puffs into the lungs 2 (two) times daily.  . celecoxib (CELEBREX) 200 MG capsule Take 200-400 mg by mouth at bedtime.   . chlorthalidone (HYGROTON) 25 MG tablet Take 12.5 mg by mouth daily.  .  clopidogrel (PLAVIX) 75 MG tablet TAKE 1 TABLET BY MOUTH DAILY  . COMBIVENT RESPIMAT 20-100 MCG/ACT AERS respimat Inhale 1 puff into the lungs 3 (three) times daily.   . cyclobenzaprine (FLEXERIL) 5 MG tablet Take 5 mg by mouth 2 (two) times daily.   Marland Kitchen ibuprofen (ADVIL,MOTRIN) 200 MG tablet Take 200 mg by mouth every 6 (six) hours as needed for moderate pain.  Marland Kitchen losartan (COZAAR) 25 MG tablet Take 25 mg by mouth daily.  . TRELEGY ELLIPTA 100-62.5-25 MCG/INH AEPB INHALE 1 INHALTION DAILY  . ZONTIVITY 2.08 MG TABS Take 1 tablet by mouth daily.   . [DISCONTINUED] umeclidinium-vilanterol (ANORO ELLIPTA) 62.5-25 MCG/INH AEPB Inhale 1 puff into the lungs daily.    Past Medical History:  Diagnosis Date  . COPD (chronic obstructive pulmonary disease) ([place])   . Hypertension   . Nicotine addiction   . Peripheral vascular disease [facility])     Past Surgical History:  Procedure Laterality Date  . ABDOMINAL HYSTERECTOMY    . COLONOSCOPY WITH PROPOFOL N/A 02/21/2016   Procedure: COLONOSCOPY WITH PROPOFOL;  Surgeon: Lollie Sails, MD;  Location: [facility] ENDOSCOPY;  Service: Endoscopy;  Laterality: N/A;  . LOWER EXTREMITY ANGIOGRAPHY Right 05/28/2017   Procedure: Lower Extremity Angiography;  Surgeon: Katha Cabal, MD;  Location: [place] CV LAB;  Service: Cardiovascular;  Laterality: Right;  . MASTOIDECTOMY    . NECK SURGERY    . PERIPHERAL VASCULAR CATHETERIZATION Left 05/29/2016   Procedure: Lower Extremity Angiography;  Surgeon: Katha Cabal, MD;  Location: [place] CV LAB;  Service: Cardiovascular;  Laterality: Left;  . PERIPHERAL VASCULAR CATHETERIZATION  05/29/2016   Procedure: Lower Extremity Intervention;  Surgeon: Katha Cabal, MD;  Location: [place] CV LAB;  Service: Cardiovascular;;  . TONSILLECTOMY      Social History Social History   Tobacco Use  . Smoking status: Former Smoker    Packs/day: 0.50    Years: 45.00    Pack years: 22.50    Types:  Cigarettes  . Smokeless tobacco: Never Used  Substance Use Topics  . Alcohol use: No  . Drug use: No    Family History Family History  Problem Relation Age of Onset  . COPD Mother   . Hypertension Mother   . COPD Father   . Breast cancer Maternal Grandmother 60  . Breast cancer Cousin 50       maternal side    No Known Allergies   REVIEW OF SYSTEMS (Negative unless checked)  Constitutional: [] Weight loss  [] Fever  [] Chills Cardiac: [] Chest pain   [] Chest pressure   [] Palpitations   [] Shortness of breath when laying flat   [] Shortness of breath with exertion. Vascular:  [x] Pain in legs with walking   [] Pain in legs at rest  [] History of DVT   [] Phlebitis   [] Swelling in legs   [] Varicose veins   [] Non-healing ulcers Pulmonary:   [] Uses home oxygen   [] Productive cough   [] Hemoptysis   [] Wheeze  [] COPD   [] Asthma Neurologic:  [] Dizziness   [] Seizures   [] History of stroke   [] History of TIA  [] Aphasia   [] Vissual changes   [] Weakness or numbness in arm   [] Weakness or numbness in leg Musculoskeletal:   [] Joint swelling   [] Joint pain   [] Low back pain Hematologic:  [] Easy bruising  [] Easy bleeding   [] Hypercoagulable state   [] Anemic Gastrointestinal:  [] Diarrhea   [] Vomiting  [] Gastroesophageal reflux/heartburn   [] Difficulty swallowing. Genitourinary:  [] Chronic kidney disease   [] Difficult urination  [] Frequent urination   [] Blood in urine Skin:  [] Rashes   [] Ulcers  Psychological:  [] History of anxiety   []  History of major depression.  Physical Examination  Vitals:   07/07/18 0937  BP: 127/71  Pulse: 62  Resp: 17  Weight: 92 lb (41.7 kg)  Height: 5\' 4"  (1.626 m)   Body mass index is 15.79 kg/m. Gen: WD/WN, NAD Head: [place]/AT, No temporalis wasting.  Ear/Nose/Throat: Hearing grossly intact, nares w/o erythema or drainage Eyes: PER, EOMI, sclera nonicteric.  Neck: Supple, no large masses.   Pulmonary:  Good air movement, no audible wheezing bilaterally, no use of  accessory muscles.  Cardiac: RRR, no JVD Vascular: bilateral carotid bruit Vessel Right Left  Radial Palpable Palpable  Brachial Palpable Palpable  Carotid Palpable Palpable  PT Not Palpable Palpable  DP Palpable Palpable  Gastrointestinal: Non-distended. No guarding/no peritoneal signs.  Musculoskeletal: M/S 5/5 throughout.  No deformity or atrophy.  Neurologic: CN 2-12 intact. Symmetrical.  Speech is fluent. Motor exam as listed above. Psychiatric: Judgment intact, Mood & affect appropriate for pt's clinical situation. Dermatologic: No rashes or ulcers noted.  No changes consistent with cellulitis. Lymph : No lichenification or skin changes of chronic lymphedema.  CBC Lab Results  Component Value Date   WBC 6.6 08/09/2016   HGB 16.6 (H) 08/09/2016   HCT 48.0 (H) 08/09/2016   MCV 97.8 08/09/2016   PLT 171 08/09/2016    BMET    Component Value Date/Time   NA 137 08/09/2016 1011  K 3.0 (L) 08/09/2016 1011   CL 96 (L) 08/09/2016 1011   CO2 30 08/09/2016 1011   GLUCOSE 88 08/09/2016 1011   BUN 23 (H) 05/28/2017 1126   CREATININE 1.26 (H) 05/28/2017 1126   CALCIUM 8.7 (L) 08/09/2016 1011   GFRNONAA 46 (L) 05/28/2017 1126   GFRAA 53 (L) 05/28/2017 1126   CrCl cannot be calculated (Patient's most recent lab result is older than the maximum 21 days allowed.).  COAG No results found for: INR, PROTIME  Radiology No results found.   Assessment/Plan 1. Bilateral carotid artery stenosis Recommend:  Given the patient's asymptomatic subcritical stenosis no further invasive testing or surgery at this time.  Duplex ultrasound shows 50% stenosis bilaterally.  Continue antiplatelet therapy as prescribed Continue management of CAD, HTN and Hyperlipidemia Healthy heart diet,  encouraged exercise at least 4 times per week Follow up in 12 months with duplex ultrasound and physical exam   - VAS US CAROTID; Future  2. PAD (peripheral artery disease) (HCC)  Recommend:  The  patient has evidence of atherosclerosis of the lower extremities with claudication.  The patient does not voice lifestyle limiting changes at this point in time.  Noninvasive studies do not suggest clinically significant change.  No invasive studies, angiography or surgery at this time The patient should continue walking and begin a more formal exercise program.  The patient should continue antiplatelet therapy and aggressive treatment of the lipid abnormalities  No changes in the patient's medications at this time  The patient should continue wearing graduated compression socks 10-15 mmHg strength to control the mild edema.   - VAS [place] ABI WITH/WO TBI; Future - VAS US AORTA/IVC/ILIACS; Future  3. Chronic obstructive pulmonary disease, unspecified COPD type ([place]) Continue pulmonary medications and aerosols as already ordered, these medications have been reviewed and there are no changes at this time.   4. Mixed hyperlipidemia Continue statin as ordered and reviewed, no changes at this time    Hortencia Pilar, MD  07/07/2018 9:42 AM

## 2018-07-17 ENCOUNTER — Encounter (INDEPENDENT_AMBULATORY_CARE_PROVIDER_SITE_OTHER): Payer: Self-pay | Admitting: Vascular Surgery

## 2018-09-17 ENCOUNTER — Other Ambulatory Visit (INDEPENDENT_AMBULATORY_CARE_PROVIDER_SITE_OTHER): Payer: Self-pay | Admitting: Vascular Surgery

## 2018-09-30 ENCOUNTER — Observation Stay
Admission: EM | Admit: 2018-09-30 | Discharge: 2018-10-01 | Disposition: A | Discharge status: Home / Self Care | Payer: No Typology Code available for payment source | Attending: Internal Medicine | Admitting: Internal Medicine

## 2018-09-30 ENCOUNTER — Other Ambulatory Visit: Payer: Self-pay

## 2018-09-30 ENCOUNTER — Emergency Department: Payer: No Typology Code available for payment source

## 2018-09-30 ENCOUNTER — Encounter: Payer: Self-pay | Admitting: Emergency Medicine

## 2018-09-30 DIAGNOSIS — Z7982 Long term (current) use of aspirin: Secondary | ICD-10-CM | POA: Diagnosis not present

## 2018-09-30 DIAGNOSIS — I70219 Atherosclerosis of native arteries of extremities with intermittent claudication, unspecified extremity: Secondary | ICD-10-CM | POA: Diagnosis not present

## 2018-09-30 DIAGNOSIS — Z791 Long term (current) use of non-steroidal anti-inflammatories (NSAID): Secondary | ICD-10-CM | POA: Diagnosis not present

## 2018-09-30 DIAGNOSIS — E876 Hypokalemia: Secondary | ICD-10-CM | POA: Insufficient documentation

## 2018-09-30 DIAGNOSIS — Z79899 Other long term (current) drug therapy: Secondary | ICD-10-CM | POA: Insufficient documentation

## 2018-09-30 DIAGNOSIS — R918 Other nonspecific abnormal finding of lung field: Secondary | ICD-10-CM | POA: Diagnosis not present

## 2018-09-30 DIAGNOSIS — Z7902 Long term (current) use of antithrombotics/antiplatelets: Secondary | ICD-10-CM | POA: Diagnosis not present

## 2018-09-30 DIAGNOSIS — J1000 Influenza due to other identified influenza virus with unspecified type of pneumonia: Secondary | ICD-10-CM | POA: Diagnosis not present

## 2018-09-30 DIAGNOSIS — J111 Influenza due to unidentified influenza virus with other respiratory manifestations: Secondary | ICD-10-CM | POA: Diagnosis present

## 2018-09-30 DIAGNOSIS — J440 Chronic obstructive pulmonary disease with acute lower respiratory infection: Secondary | ICD-10-CM | POA: Diagnosis not present

## 2018-09-30 DIAGNOSIS — Z87891 Personal history of nicotine dependence: Secondary | ICD-10-CM | POA: Diagnosis not present

## 2018-09-30 DIAGNOSIS — I10 Essential (primary) hypertension: Secondary | ICD-10-CM | POA: Diagnosis not present

## 2018-09-30 DIAGNOSIS — J441 Chronic obstructive pulmonary disease with (acute) exacerbation: Secondary | ICD-10-CM | POA: Diagnosis present

## 2018-09-30 LAB — CBC WITH DIFFERENTIAL/PLATELET
ABS IMMATURE GRANULOCYTES: 0.04 10*3/uL (ref 0.00–0.07)
Basophils Absolute: 0.1 10*3/uL (ref 0.0–0.1)
Basophils Relative: 1 %
Eosinophils Absolute: 0.2 10*3/uL (ref 0.0–0.5)
Eosinophils Relative: 2 %
HCT: 45.6 % (ref 36.0–46.0)
HEMOGLOBIN: 16.2 g/dL — AB (ref 12.0–15.0)
Immature Granulocytes: 0 %
Lymphocytes Relative: 12 %
Lymphs Abs: 1.1 10*3/uL (ref 0.7–4.0)
MCH: 33.5 pg (ref 26.0–34.0)
MCHC: 35.5 g/dL (ref 30.0–36.0)
MCV: 94.4 fL (ref 80.0–100.0)
Monocytes Absolute: 0.9 10*3/uL (ref 0.1–1.0)
Monocytes Relative: 10 %
NEUTROS ABS: 7.2 10*3/uL (ref 1.7–7.7)
NEUTROS PCT: 75 %
Platelets: 325 10*3/uL (ref 150–400)
RBC: 4.83 MIL/uL (ref 3.87–5.11)
RDW: 12.5 % (ref 11.5–15.5)
WBC: 9.5 10*3/uL (ref 4.0–10.5)
nRBC: 0 % (ref 0.0–0.2)

## 2018-09-30 LAB — CBC
HCT: 42 % (ref 36.0–46.0)
Hemoglobin: 14.8 g/dL (ref 12.0–15.0)
MCH: 33.3 pg (ref 26.0–34.0)
MCHC: 35.2 g/dL (ref 30.0–36.0)
MCV: 94.6 fL (ref 80.0–100.0)
Platelets: 308 10*3/uL (ref 150–400)
RBC: 4.44 MIL/uL (ref 3.87–5.11)
RDW: 12.5 % (ref 11.5–15.5)
WBC: 12.3 10*3/uL — ABNORMAL HIGH (ref 4.0–10.5)
nRBC: 0.2 % (ref 0.0–0.2)

## 2018-09-30 LAB — BASIC METABOLIC PANEL
Anion gap: 11 (ref 5–15)
BUN: 20 mg/dL (ref 6–20)
CO2: 33 mmol/L — ABNORMAL HIGH (ref 22–32)
Calcium: 8.7 mg/dL — ABNORMAL LOW (ref 8.9–10.3)
Chloride: 89 mmol/L — ABNORMAL LOW (ref 98–111)
Creatinine, Ser: 1.16 mg/dL — ABNORMAL HIGH (ref 0.44–1.00)
GFR calc non Af Amer: 51 mL/min — ABNORMAL LOW (ref >60)
GFR, EST AFRICAN AMERICAN: 59 mL/min — AB (ref >60)
Glucose, Bld: 121 mg/dL — ABNORMAL HIGH (ref 70–99)
Potassium: 2.9 mmol/L — ABNORMAL LOW (ref 3.5–5.1)
Sodium: 133 mmol/L — ABNORMAL LOW (ref 135–145)

## 2018-09-30 LAB — TROPONIN I: Troponin I: <0.03 ng/mL (ref <0.03)

## 2018-09-30 LAB — CREATININE, SERUM
CREATININE: 0.88 mg/dL (ref 0.44–1.00)
GFR calc Af Amer: >60 mL/min (ref >60)
GFR calc non Af Amer: >60 mL/min (ref >60)

## 2018-09-30 LAB — LACTIC ACID, PLASMA: Lactic Acid, Venous: 2.1 mmol/L (ref 0.5–1.9)

## 2018-09-30 LAB — INFLUENZA PANEL BY PCR (TYPE A & B)
Influenza A By PCR: POSITIVE — AB
Influenza B By PCR: NEGATIVE

## 2018-09-30 LAB — PROCALCITONIN: Procalcitonin: <0.1 ng/mL

## 2018-09-30 MED ORDER — CLOPIDOGREL BISULFATE 75 MG PO TABS
75.0000 mg | ORAL_TABLET | Freq: Every day | ORAL | Status: DC
  Administered 2018-10-01: 75 mg via ORAL
  Filled 2018-09-30: qty 1

## 2018-09-30 MED ORDER — LOSARTAN POTASSIUM 25 MG PO TABS
25.0000 mg | ORAL_TABLET | Freq: Every day | ORAL | Status: DC
  Administered 2018-10-01: 25 mg via ORAL
  Filled 2018-09-30: qty 1

## 2018-09-30 MED ORDER — ENOXAPARIN SODIUM 40 MG/0.4ML ~~LOC~~ SOLN
40.0000 mg | SUBCUTANEOUS | Status: DC
  Administered 2018-09-30: 40 mg via SUBCUTANEOUS
  Filled 2018-09-30: qty 0.4

## 2018-09-30 MED ORDER — BUDESONIDE 0.25 MG/2ML IN SUSP
0.2500 mg | Freq: Two times a day (BID) | RESPIRATORY_TRACT | Status: DC
  Administered 2018-09-30 – 2018-10-01 (×2): 0.25 mg via RESPIRATORY_TRACT
  Filled 2018-09-30 (×2): qty 2

## 2018-09-30 MED ORDER — OSELTAMIVIR PHOSPHATE 30 MG PO CAPS
30.0000 mg | ORAL_CAPSULE | Freq: Two times a day (BID) | ORAL | Status: DC
  Administered 2018-09-30 – 2018-10-01 (×2): 30 mg via ORAL
  Filled 2018-09-30 (×3): qty 1

## 2018-09-30 MED ORDER — SODIUM CHLORIDE 0.9 % IV SOLN
250.0000 mL | INTRAVENOUS | Status: DC | PRN
  Administered 2018-09-30: 250 mL via INTRAVENOUS

## 2018-09-30 MED ORDER — SODIUM CHLORIDE 0.9% FLUSH: 3.0000 mL | INTRAVENOUS | Status: DC | PRN

## 2018-09-30 MED ORDER — IBUPROFEN 400 MG PO TABS: 200.0000 mg | ORAL_TABLET | Freq: Four times a day (QID) | ORAL | Status: DC | PRN

## 2018-09-30 MED ORDER — GUAIFENESIN ER 600 MG PO TB12
600.0000 mg | ORAL_TABLET | Freq: Two times a day (BID) | ORAL | Status: DC
  Administered 2018-09-30 – 2018-10-01 (×2): 600 mg via ORAL
  Filled 2018-09-30 (×2): qty 1

## 2018-09-30 MED ORDER — ASPIRIN 81 MG PO CHEW
81.0000 mg | CHEWABLE_TABLET | Freq: Every day | ORAL | Status: DC
  Administered 2018-10-01: 81 mg via ORAL
  Filled 2018-09-30: qty 1

## 2018-09-30 MED ORDER — VORAPAXAR SULFATE 2.08 MG PO TABS: 1.0000 | ORAL_TABLET | Freq: Every day | ORAL | Status: DC

## 2018-09-30 MED ORDER — METHYLPREDNISOLONE SODIUM SUCC 125 MG IJ SOLR
60.0000 mg | Freq: Four times a day (QID) | INTRAMUSCULAR | Status: DC
  Administered 2018-09-30 – 2018-10-01 (×3): 60 mg via INTRAVENOUS
  Filled 2018-09-30 (×3): qty 2

## 2018-09-30 MED ORDER — SODIUM CHLORIDE 0.9 % IV SOLN
1.0000 g | Freq: Once | INTRAVENOUS | Status: AC
  Administered 2018-09-30: 1 g via INTRAVENOUS
  Filled 2018-09-30: qty 1

## 2018-09-30 MED ORDER — OSELTAMIVIR PHOSPHATE 75 MG PO CAPS
75.0000 mg | ORAL_CAPSULE | Freq: Once | ORAL | Status: AC
  Administered 2018-09-30: 75 mg via ORAL
  Filled 2018-09-30: qty 1

## 2018-09-30 MED ORDER — ONDANSETRON HCL 4 MG PO TABS: 4.0000 mg | ORAL_TABLET | Freq: Four times a day (QID) | ORAL | Status: DC | PRN

## 2018-09-30 MED ORDER — CELECOXIB 200 MG PO CAPS
200.0000 mg | ORAL_CAPSULE | Freq: Every day | ORAL | Status: DC
  Administered 2018-09-30: 400 mg via ORAL
  Filled 2018-09-30 (×2): qty 2

## 2018-09-30 MED ORDER — DOXYCYCLINE HYCLATE 100 MG PO TABS
100.0000 mg | ORAL_TABLET | Freq: Two times a day (BID) | ORAL | Status: DC
  Administered 2018-09-30 – 2018-10-01 (×2): 100 mg via ORAL
  Filled 2018-09-30 (×2): qty 1

## 2018-09-30 MED ORDER — ONDANSETRON HCL 4 MG/2ML IJ SOLN: 4.0000 mg | Freq: Four times a day (QID) | INTRAMUSCULAR | Status: DC | PRN

## 2018-09-30 MED ORDER — SODIUM CHLORIDE 0.9% FLUSH
3.0000 mL | Freq: Two times a day (BID) | INTRAVENOUS | Status: DC
  Administered 2018-09-30 – 2018-10-01 (×2): 3 mL via INTRAVENOUS

## 2018-09-30 MED ORDER — ACETAMINOPHEN 325 MG PO TABS: 650.0000 mg | ORAL_TABLET | Freq: Four times a day (QID) | ORAL | Status: DC | PRN

## 2018-09-30 MED ORDER — IPRATROPIUM-ALBUTEROL 0.5-2.5 (3) MG/3ML IN SOLN
3.0000 mL | RESPIRATORY_TRACT | Status: DC
  Administered 2018-09-30 – 2018-10-01 (×5): 3 mL via RESPIRATORY_TRACT
  Filled 2018-09-30 (×5): qty 3

## 2018-09-30 MED ORDER — ACETAMINOPHEN 650 MG RE SUPP: 650.0000 mg | Freq: Four times a day (QID) | RECTAL | Status: DC | PRN

## 2018-09-30 MED ORDER — SODIUM CHLORIDE 0.9 % IV BOLUS: 500.0000 mL | Freq: Once | INTRAVENOUS | Status: DC

## 2018-09-30 MED ORDER — SODIUM CHLORIDE 0.9 % IV BOLUS
1000.0000 mL | Freq: Once | INTRAVENOUS | Status: AC
  Administered 2018-09-30: 1000 mL via INTRAVENOUS

## 2018-09-30 MED ORDER — SODIUM CHLORIDE 0.9 % IV SOLN
500.0000 mg | Freq: Once | INTRAVENOUS | Status: AC
  Administered 2018-09-30: 500 mg via INTRAVENOUS
  Filled 2018-09-30: qty 500

## 2018-09-30 MED ORDER — CYCLOBENZAPRINE HCL 10 MG PO TABS
5.0000 mg | ORAL_TABLET | Freq: Two times a day (BID) | ORAL | Status: DC
  Administered 2018-09-30 – 2018-10-01 (×2): 5 mg via ORAL
  Filled 2018-09-30 (×2): qty 1

## 2018-09-30 MED ORDER — BENZONATATE 100 MG PO CAPS: 200.0000 mg | ORAL_CAPSULE | ORAL | Status: DC | PRN

## 2018-09-30 MED ORDER — ATORVASTATIN CALCIUM 20 MG PO TABS
40.0000 mg | ORAL_TABLET | Freq: Every day | ORAL | Status: DC
  Administered 2018-10-01: 40 mg via ORAL
  Filled 2018-09-30: qty 2

## 2018-09-30 NOTE — ED Provider Notes (Signed)
[facility] Emergency Department Provider Note  ____________________________________________   I have reviewed the triage vital signs and the nursing notes. Where available I have reviewed prior notes and, if possible and indicated, outside hospital notes.    HISTORY  Chief Complaint Cough    HPI ASHE GRAYBEAL is a 63 y.o. female who presents today complaining of "my doctor sent me in".  Patient states she had URI symptoms with cough and congestion, received steroids and antibiotics which ended this weekend, has not had a fever since this weekend, and feels much better overall, states that she is breathing better and her "congestion" is much better.  Overall she felt better and went to see her doctor but her blood pressure was somewhat low and she was sent in for that reason to the extent that she can determine.  Does state that during her illness she has not had much to eat or drink.  She states she feels a little bit dizzy.  According to notes she was hypoxemic hypotensive and orthostatic per the physician who saw her as an outpatient.  Patient's blood pressure always in the 1 10-1 20 range she did take her blood pressure medications today she has not been eating or drinking very much.  Overall however she feels much better.  Blood pressure at the doctor's office was 110/80 it went down to 100/60 when she stood up, her heart rate did increase, and her oxygen saturation they listed it is 85%  Patient states overall though she feels somewhat better than she did until this weekend and she states that she has not smoked in 4 years  Past Medical History:  Diagnosis Date  . COPD (chronic obstructive pulmonary disease) ([place])   . Hypertension   . Nicotine addiction   . Peripheral vascular disease [facility])     Patient Active Problem List   Diagnosis Date Noted  . Leg pain 05/27/2017  . COPD (chronic obstructive pulmonary disease) ([place]) 12/27/2016  . Atherosclerosis of  native arteries of extremity with intermittent claudication ([place]) 09/20/2016  . Syncope 09/20/2016  . Bilateral carotid artery stenosis 07/30/2016  . Hyperlipidemia 06/19/2016  . PAD (peripheral artery disease) ([place]) 06/19/2016  . Community acquired pneumonia 02/14/2015  . Nicotine addiction 02/14/2015    Past Surgical History:  Procedure Laterality Date  . ABDOMINAL HYSTERECTOMY    . COLONOSCOPY WITH PROPOFOL N/A 02/21/2016   Procedure: COLONOSCOPY WITH PROPOFOL;  Surgeon: Lollie Sails, MD;  Location: [facility] ENDOSCOPY;  Service: Endoscopy;  Laterality: N/A;  . LOWER EXTREMITY ANGIOGRAPHY Right 05/28/2017   Procedure: Lower Extremity Angiography;  Surgeon: Katha Cabal, MD;  Location: [place] CV LAB;  Service: Cardiovascular;  Laterality: Right;  . MASTOIDECTOMY    . NECK SURGERY    . PERIPHERAL VASCULAR CATHETERIZATION Left 05/29/2016   Procedure: Lower Extremity Angiography;  Surgeon: Katha Cabal, MD;  Location: [place] CV LAB;  Service: Cardiovascular;  Laterality: Left;  . PERIPHERAL VASCULAR CATHETERIZATION  05/29/2016   Procedure: Lower Extremity Intervention;  Surgeon: Katha Cabal, MD;  Location: [place] CV LAB;  Service: Cardiovascular;;  . TONSILLECTOMY      Prior to Admission medications   Medication Sig Start Date End Date Taking? Authorizing Provider  aspirin 81 MG chewable tablet Chew by mouth daily.    [provider]  atorvastatin (LIPITOR) 40 MG tablet Take 40 mg by mouth daily.    [provider]  benzonatate (TESSALON) 200 MG capsule benzonatate 200 mg  capsule 08/24/15   [provider]  budesonide-formoterol (SYMBICORT) 80-4.5 MCG/ACT inhaler Inhale 2 puffs into the lungs 2 (two) times daily.    [provider]  celecoxib (CELEBREX) 200 MG capsule Take 200-400 mg by mouth at bedtime.     [provider]  chlorthalidone (HYGROTON) 25 MG tablet Take 12.5 mg by mouth daily.    [provider]  clopidogrel (PLAVIX) 75 MG tablet TAKE 1 TABLET BY MOUTH DAILY 09/22/18   Schnier, Dolores Lory, MD  COMBIVENT RESPIMAT 20-100 MCG/ACT AERS respimat Inhale 1 puff into the lungs 3 (three) times daily.  04/22/16   [provider]  cyclobenzaprine (FLEXERIL) 5 MG tablet Take 5 mg by mouth 2 (two) times daily.     [provider]  ibuprofen (ADVIL,MOTRIN) 200 MG tablet Take 200 mg by mouth every 6 (six) hours as needed for moderate pain.    [provider]  losartan (COZAAR) 25 MG tablet Take 25 mg by mouth daily.    [provider]  Donnal Debar 100-62.5-25 MCG/INH AEPB INHALE 1 INHALTION DAILY 06/02/18   [provider]  ZONTIVITY 2.08 MG TABS Take 1 tablet by mouth daily.  05/16/16   [provider]    Allergies Patient has no known allergies.  Family History  Problem Relation Age of Onset  . COPD Mother   . Hypertension Mother   . COPD Father   . Breast cancer Maternal Grandmother 60  . Breast cancer Cousin 50       maternal side    Social History Social History   Tobacco Use  . Smoking status: Former Smoker    Packs/day: 0.50    Years: 45.00    Pack years: 22.50    Types: Cigarettes  . Smokeless tobacco: Never Used  Substance Use Topics  . Alcohol use: No  . Drug use: No    Review of Systems Constitutional: No fever/chills Eyes: No visual changes. ENT: No sore throat. No stiff neck no neck pain Cardiovascular: Denies chest pain. Respiratory: Denies shortness of breath. Gastrointestinal:   no vomiting.  No diarrhea.  No constipation. Genitourinary: Negative for dysuria. Musculoskeletal: Negative lower extremity swelling Skin: Negative for rash. Neurological: Negative for severe headaches, focal weakness or numbness.   ____________________________________________   PHYSICAL EXAM:  VITAL SIGNS: ED Triage Vitals  Enc Vitals Group     BP 09/30/18 1154 (!) 107/46     Pulse Rate 09/30/18 1154 62      Resp 09/30/18 1154 18     Temp 09/30/18 1216 (!) 97.4 F (36.3 C)     Temp Source 09/30/18 1216 Oral     SpO2 09/30/18 1154 96 %     Weight 09/30/18 1213 92 lb (41.7 kg)     Height 09/30/18 1213 5\' 4"  (1.626 m)     Head Circumference --      Peak Flow --      Pain Score 09/30/18 1213 0     Pain Loc --      Pain Edu? --      Excl. in [place]? --     Constitutional: Alert and oriented. Well appearing and in no acute distress. Eyes: Conjunctivae are normal Head: Atraumatic HEENT: No congestion/rhinnorhea. Mucous membranes are moist.  Oropharynx non-erythematous Neck:   Nontender with no meningismus, no masses, no stridor Cardiovascular: Normal rate, regular rhythm. Grossly normal heart sounds.  Good peripheral circulation. Respiratory: Normal respiratory effort.  No retractions.  Diminished somewhat in the  bases but no rales or rhonchi. Abdominal: Soft and nontender. No distention. No guarding no rebound Back:  There is no focal tenderness or step off.  there is no midline tenderness there are no lesions noted. there is no CVA tenderness Musculoskeletal: No lower extremity tenderness, no upper extremity tenderness. No joint effusions, no DVT signs strong distal pulses no edema Neurologic:  Normal speech and language. No gross focal neurologic deficits are appreciated.  Skin:  Skin is warm, dry and intact. No rash noted. Psychiatric: Mood and affect are normal. Speech and behavior are normal.  ____________________________________________   LABS (all labs ordered are listed, but only abnormal results are displayed)  Labs Reviewed  CBC WITH DIFFERENTIAL/PLATELET - Abnormal; Notable for the following components:      Result Value   Hemoglobin 16.2 (*)    All other components within normal limits  BASIC METABOLIC PANEL - Abnormal; Notable for the following components:   Sodium 133 (*)    Potassium 2.9 (*)    Chloride 89 (*)    CO2 33 (*)    Glucose, Bld 121 (*)    Creatinine, Ser  1.16 (*)    Calcium 8.7 (*)    GFR calc non Af Amer 51 (*)    GFR calc Af Amer 59 (*)    All other components within normal limits  LACTIC ACID, PLASMA - Abnormal; Notable for the following components:   Lactic Acid, Venous 2.1 (*)    All other components within normal limits  TROPONIN I  INFLUENZA PANEL BY PCR (TYPE A & B)    Pertinent labs  results that were available during my care of the patient were reviewed by me and considered in my medical decision making (see chart for details). ____________________________________________  EKG  I personally interpreted any EKGs ordered by me or triage Sinus tach rate 106, PACs noted, normal axis no acute ST elevation or depression, RSR prime configuration noted, ____________________________________________  RADIOLOGY  Pertinent labs & imaging results that were available during my care of the patient were reviewed by me and considered in my medical decision making (see chart for details). If possible, patient and/or family made aware of any abnormal findings.  Dg Chest 2 View  Result Date: 09/30/2018 CLINICAL DATA:  Shortness of breath, cough for 1 week EXAM: CHEST - 2 VIEW COMPARISON:  08/09/2016 FINDINGS: The lungs are hyperinflated likely secondary to COPD. There is hazy left lower lobe airspace disease at the left costophrenic angle concerning for atelectasis versus pneumonia. There is no pleural effusion or pneumothorax. The heart and mediastinal contours are unremarkable. The osseous structures are unremarkable. IMPRESSION: Hazy left lower lobe airspace disease at the left costophrenic angle concerning for atelectasis versus pneumonia. Electronically Signed   By: Kathreen Devoid   On: 09/30/2018 12:50   ____________________________________________    PROCEDURES  Procedure(s) performed: None  Procedures  Critical Care performed: None  ____________________________________________   INITIAL IMPRESSION / ASSESSMENT AND PLAN / ED  COURSE  Pertinent labs & imaging results that were available during my care of the patient were reviewed by me and considered in my medical decision making (see chart for details).  It is flu positive, borderline lactic, borderline pressures we are giving her antibiotics here anti-flu medication here, IV fluids, and we will admit her to the ER.  She is    ____________________________________________   FINAL CLINICAL IMPRESSION(S) / ED DIAGNOSES  Final diagnoses:  None      This chart was dictated  using voice recognition software.  Despite best efforts to proofread,  errors can occur which can change meaning.      Schuyler Amor, MD 09/30/18 1534

## 2018-09-30 NOTE — Progress Notes (Signed)
PHARMACY NOTE:  ANTIMICROBIAL RENAL DOSAGE ADJUSTMENT  Current antimicrobial regimen includes a mismatch between antimicrobial dosage and estimated renal function.  As per policy approved by the Pharmacy & Therapeutics and Medical Executive Committees, the antimicrobial dosage will be adjusted accordingly.  Current antimicrobial dosage:  Tamiflu 75 mg PO BID   Indication: Flu   Renal Function:  Estimated Creatinine Clearance: 34 mL/min (A) (by C-G formula based on SCr of 1.16 mg/dL (H)). []      On intermittent HD, scheduled: []      On CRRT    Antimicrobial dosage has been changed to:  Tamiflu 30 mg PO BID X 5 days  Additional comments:   Thank you for allowing pharmacy to be a part of this patient's care.  Sheccid Lahmann D, [facility] 09/30/2018 4:55 PM

## 2018-09-30 NOTE — H&P (Signed)
[place] [facility] [place] NAME: Wendy Ferrell    MR#:  [id number]  DATE OF BIRTH:  14-Apr-1956  DATE OF ADMISSION:  09/30/2018  PRIMARY CARE PHYSICIAN: Jodi Marble, MD   REQUESTING/REFERRING PHYSICIAN: Schuyler Amor, MD  CHIEF COMPLAINT:   Chief Complaint  Patient presents with  . Cough    HISTORY OF PRESENT ILLNESS: Wendy Ferrell  is a 63 y.o. female with a known history of COPD ,HTN, PVD presents with sob and cough x 1 week .  She was seen by her primary care provider Dr. Elijio Miles who prescribed her some antibiotics.  She continued to feel short of breath and poorly therefore she comes to the emergency room she is noted to have COPD exasperation as well as flu.  She denies any fevers chills denies any chest pain or palpitations does complain of cough    PAST MEDICAL HISTORY:   Past Medical History:  Diagnosis Date  . COPD (chronic obstructive pulmonary disease) ([place])   . Hypertension   . Nicotine addiction   . Peripheral vascular disease ([place])     PAST SURGICAL HISTORY:  Past Surgical History:  Procedure Laterality Date  . ABDOMINAL HYSTERECTOMY    . COLONOSCOPY WITH PROPOFOL N/A 02/21/2016   Procedure: COLONOSCOPY WITH PROPOFOL;  Surgeon: Lollie Sails, MD;  Location: [facility] ENDOSCOPY;  Service: Endoscopy;  Laterality: N/A;  . LOWER EXTREMITY ANGIOGRAPHY Right 05/28/2017   Procedure: Lower Extremity Angiography;  Surgeon: Katha Cabal, MD;  Location: [place] CV LAB;  Service: Cardiovascular;  Laterality: Right;  . MASTOIDECTOMY    . NECK SURGERY    . PERIPHERAL VASCULAR CATHETERIZATION Left 05/29/2016   Procedure: Lower Extremity Angiography;  Surgeon: Katha Cabal, MD;  Location: [place] CV LAB;  Service: Cardiovascular;  Laterality: Left;  . PERIPHERAL VASCULAR CATHETERIZATION  05/29/2016   Procedure: Lower Extremity Intervention;  Surgeon: Katha Cabal, MD;  Location: [place] CV LAB;  Service:  Cardiovascular;;  . TONSILLECTOMY      SOCIAL HISTORY:  Social History   Tobacco Use  . Smoking status: Former Smoker    Packs/day: 0.50    Years: 45.00    Pack years: 22.50    Types: Cigarettes  . Smokeless tobacco: Never Used  Substance Use Topics  . Alcohol use: No    FAMILY HISTORY:  Family History  Problem Relation Age of Onset  . COPD Mother   . Hypertension Mother   . COPD Father   . Breast cancer Maternal Grandmother 60  . Breast cancer Cousin 50       maternal side    DRUG ALLERGIES: No Known Allergies  REVIEW OF SYSTEMS:   CONSTITUTIONAL: No fever, fatigue or weakness.  EYES: No blurred or double vision.  EARS, NOSE, AND THROAT: No tinnitus or ear pain.  RESPIRATORY: Positive cough, positive shortness of breath, positive wheezing or hemoptysis.  CARDIOVASCULAR: No chest pain, orthopnea, edema.  GASTROINTESTINAL: No nausea, vomiting, diarrhea or abdominal pain.  GENITOURINARY: No dysuria, hematuria.  ENDOCRINE: No polyuria, nocturia,  HEMATOLOGY: No anemia, easy bruising or bleeding SKIN: No rash or lesion. MUSCULOSKELETAL: No joint pain or arthritis.   NEUROLOGIC: No tingling, numbness, weakness.  PSYCHIATRY: No anxiety or depression.   MEDICATIONS AT HOME:  Prior to Admission medications   Medication Sig Start Date End Date Taking? Authorizing Provider  aspirin 81 MG chewable tablet Chew by mouth daily.    [provider]  atorvastatin (  LIPITOR) 40 MG tablet Take 40 mg by mouth daily.    [provider]  benzonatate (TESSALON) 200 MG capsule benzonatate 200 mg capsule 08/24/15   [provider]  budesonide-formoterol (SYMBICORT) 80-4.5 MCG/ACT inhaler Inhale 2 puffs into the lungs 2 (two) times daily.    [provider]  celecoxib (CELEBREX) 200 MG capsule Take 200-400 mg by mouth at bedtime.     [provider]  chlorthalidone (HYGROTON) 25 MG tablet Take 12.5 mg by mouth daily.    [provider]   clopidogrel (PLAVIX) 75 MG tablet TAKE 1 TABLET BY MOUTH DAILY 09/22/18   Schnier, Dolores Lory, MD  COMBIVENT RESPIMAT 20-100 MCG/ACT AERS respimat Inhale 1 puff into the lungs 3 (three) times daily.  04/22/16   [provider]  cyclobenzaprine (FLEXERIL) 5 MG tablet Take 5 mg by mouth 2 (two) times daily.     [provider]  ibuprofen (ADVIL,MOTRIN) 200 MG tablet Take 200 mg by mouth every 6 (six) hours as needed for moderate pain.    [provider]  losartan (COZAAR) 25 MG tablet Take 25 mg by mouth daily.    [provider]  Donnal Debar 100-62.5-25 MCG/INH AEPB INHALE 1 INHALTION DAILY 06/02/18   [provider]  ZONTIVITY 2.08 MG TABS Take 1 tablet by mouth daily.  05/16/16   [provider]      PHYSICAL EXAMINATION:   VITAL SIGNS: Blood pressure (!) 96/46, pulse 67, temperature (!) 97.4 F (36.3 C), temperature source Oral, resp. rate (!) 25, height 5\' 4"  (1.626 m), weight 41.7 kg, SpO2 93 %.  GENERAL:  63 y.o.-year-old patient lying in the bed with no acute distress.  EYES: Pupils equal, round, reactive to light and accommodation. No scleral icterus. Extraocular muscles intact.  HEENT: Head atraumatic, normocephalic. Oropharynx and nasopharynx clear.  NECK:  Supple, no jugular venous distention. No thyroid enlargement, no tenderness.  LUNGS: Diminished breath sounds bilaterally without any accessory muscle usage CARDIOVASCULAR: S1, S2 normal. No murmurs, rubs, or gallops.  ABDOMEN: Soft, nontender, nondistended. Bowel sounds present. No organomegaly or mass.  EXTREMITIES: No pedal edema, cyanosis, or clubbing.  NEUROLOGIC: Cranial nerves II through XII are intact. Muscle strength 5/5 in all extremities. Sensation intact. Gait not checked.  PSYCHIATRIC: The patient is alert and oriented x 3.  SKIN: No obvious rash, lesion, or ulcer.   LABORATORY PANEL:   CBC Recent Labs  Lab 09/30/18 1220  WBC 9.5  HGB 16.2*  HCT 45.6   PLT 325  MCV 94.4  MCH 33.5  MCHC 35.5  RDW 12.5  LYMPHSABS 1.1  MONOABS 0.9  EOSABS 0.2  BASOSABS 0.1   ------------------------------------------------------------------------------------------------------------------  Chemistries  Recent Labs  Lab 09/30/18 1220  NA 133*  K 2.9*  CL 89*  CO2 33*  GLUCOSE 121*  BUN 20  CREATININE 1.16*  CALCIUM 8.7*   ------------------------------------------------------------------------------------------------------------------ estimated creatinine clearance is 34 mL/min (A) (by C-G formula based on SCr of 1.16 mg/dL (H)). ------------------------------------------------------------------------------------------------------------------ No results for input(s): TSH, T4TOTAL, T3FREE, THYROIDAB in the last 72 hours.  Invalid input(s): FREET3   Coagulation profile No results for input(s): INR, PROTIME in the last 168 hours. ------------------------------------------------------------------------------------------------------------------- No results for input(s): DDIMER in the last 72 hours. -------------------------------------------------------------------------------------------------------------------  Cardiac Enzymes Recent Labs  Lab 09/30/18 1220  TROPONINI <0.03   ------------------------------------------------------------------------------------------------------------------ Invalid input(s): POCBNP  ---------------------------------------------------------------------------------------------------------------  Urinalysis No results found for: COLORURINE, APPEARANCEUR, LABSPEC, PHURINE, GLUCOSEU, HGBUR, BILIRUBINUR, KETONESUR, PROTEINUR, UROBILINOGEN, NITRITE, LEUKOCYTESUR   RADIOLOGY: Dg  Chest 2 View  Result Date: 09/30/2018 CLINICAL DATA:  Shortness of breath, cough for 1 week EXAM: CHEST - 2 VIEW COMPARISON:  08/09/2016 FINDINGS: The lungs are hyperinflated likely secondary to COPD. There is hazy left lower lobe  airspace disease at the left costophrenic angle concerning for atelectasis versus pneumonia. There is no pleural effusion or pneumothorax. The heart and mediastinal contours are unremarkable. The osseous structures are unremarkable. IMPRESSION: Hazy left lower lobe airspace disease at the left costophrenic angle concerning for atelectasis versus pneumonia. Electronically Signed   By: Kathreen Devoid   On: 09/30/2018 12:50    EKG: Orders placed or performed during the hospital encounter of 09/30/18  . ED EKG  . ED EKG    IMPRESSION AND PLAN: Patient is a 63 year old presenting with worsening shortness of breath  1.  Acute on chronic COPD exasperation we will treat with nebulizer steroids and antibiotics  2.  Influenza A positive treat with Tamiflu  3.  Peripheral vascular disease continue therapy with Plavix  4.  Hypokalemia due to chlorthalidone Replace potassium  5.  Hypertension continue Cozaar  6.  Miscellaneous Lovenox for DVT prophylaxis   All the records are reviewed and case discussed with ED provider. Management plans discussed with the patient, family and they are in agreement.  CODE STATUS: Code Status History    Date Active Date Inactive Code Status Order ID Comments User Context   05/28/2017 1530 05/28/2017 2211 Full Code [id number]  Katha Cabal, MD Inpatient   02/14/2015 1345 02/16/2015 1928 Full Code [id number]  Dustin Flock, MD Inpatient       TOTAL TIME TAKING CARE OF THIS PATIENT: 55 minutes.    Dustin Flock M.D on 09/30/2018 at 3:48 PM  Between 7am to 6pm - Pager - [phone]  After 6pm go to [email or web address] - password [email or web address]  [employer] Office  [phone]  CC: Primary care physician; Jodi Marble, MD

## 2018-09-30 NOTE — ED Triage Notes (Signed)
Pt here for [facility] and cough.  Was recently treated for PNA even though xray did not show it.  Finished abx yesterday. PCP sent today for hypoxia in office and no relief of symptoms. VSS in triage other than mild tachypnea. Unlabored respirations.

## 2018-09-30 NOTE — Progress Notes (Signed)
Advanced care plan.  Purpose of the Encounter: CODE STATUS  Parties in Attendance: Patient herself  Patient's Decision Capacity: Intact Subjective/Patient's story: Wendy Ferrell  is a 63 y.o. female with a known history of COPD ,HTN, PVD presents with sob and cough x 1 week .  She was seen by her primary care provider Dr. Elijio Miles who prescribed her some antibiotics.  She continued to feel short of breath and poorly therefore she comes to the emergency room she is noted to have COPD exasperation as well as flu.    Objective/Medical story  I discussed with the patient regarding her desires for cardiac and pulmonary resuscitation.     Goals of care determination:   She states she would like to be full code would like everytthing done  CODE STATUS:  Full code  Time spent discussing advanced care planning: 16 minutes

## 2018-10-01 LAB — BASIC METABOLIC PANEL
ANION GAP: 11 (ref 5–15)
BUN: 22 mg/dL — ABNORMAL HIGH (ref 6–20)
CHLORIDE: 93 mmol/L — AB (ref 98–111)
CO2: 28 mmol/L (ref 22–32)
Calcium: 8 mg/dL — ABNORMAL LOW (ref 8.9–10.3)
Creatinine, Ser: 1.04 mg/dL — ABNORMAL HIGH (ref 0.44–1.00)
GFR calc Af Amer: >60 mL/min (ref >60)
GFR calc non Af Amer: 58 mL/min — ABNORMAL LOW (ref >60)
Glucose, Bld: 188 mg/dL — ABNORMAL HIGH (ref 70–99)
Potassium: 3.5 mmol/L (ref 3.5–5.1)
Sodium: 132 mmol/L — ABNORMAL LOW (ref 135–145)

## 2018-10-01 LAB — CBC
HCT: 37.7 % (ref 36.0–46.0)
Hemoglobin: 13.2 g/dL (ref 12.0–15.0)
MCH: 33 pg (ref 26.0–34.0)
MCHC: 35 g/dL (ref 30.0–36.0)
MCV: 94.3 fL (ref 80.0–100.0)
Platelets: 261 10*3/uL (ref 150–400)
RBC: 4 MIL/uL (ref 3.87–5.11)
RDW: 12.2 % (ref 11.5–15.5)
WBC: 4.8 10*3/uL (ref 4.0–10.5)
nRBC: 0 % (ref 0.0–0.2)

## 2018-10-01 LAB — HIV ANTIBODY (ROUTINE TESTING W REFLEX): HIV Screen 4th Generation wRfx: NONREACTIVE

## 2018-10-01 MED ORDER — OSELTAMIVIR PHOSPHATE 30 MG PO CAPS: 30.0000 mg | ORAL_CAPSULE | Freq: Two times a day (BID) | ORAL | 0 refills | Status: AC

## 2018-10-01 MED ORDER — LEVOFLOXACIN IN D5W 750 MG/150ML IV SOLN
750.0000 mg | INTRAVENOUS | Status: DC
  Filled 2018-10-01: qty 150

## 2018-10-01 MED ORDER — ADULT MULTIVITAMIN W/MINERALS CH: 1.0000 | ORAL_TABLET | Freq: Every day | ORAL | Status: DC

## 2018-10-01 MED ORDER — ENSURE ENLIVE PO LIQD: 237.0000 mL | Freq: Two times a day (BID) | ORAL | Status: DC

## 2018-10-01 MED ORDER — METHYLPREDNISOLONE SODIUM SUCC 40 MG IJ SOLR: 40.0000 mg | Freq: Three times a day (TID) | INTRAMUSCULAR | Status: DC

## 2018-10-01 MED ORDER — PREDNISONE 20 MG PO TABS: 40.0000 mg | ORAL_TABLET | Freq: Every day | ORAL | 0 refills | Status: AC

## 2018-10-01 MED ORDER — LEVOFLOXACIN 750 MG PO TABS: 750.0000 mg | ORAL_TABLET | ORAL | 0 refills | Status: AC

## 2018-10-01 NOTE — Progress Notes (Signed)
Discharge order received. Patient is alert and oriented. Vital signs stable . No signs of acute distress. Discharge instructions given. Patient verbalized understanding. No other issues noted at this time.   

## 2018-10-01 NOTE — Progress Notes (Signed)
Initial Nutrition Assessment  DOCUMENTATION CODES:   Severe malnutrition in context of chronic illness  INTERVENTION:   Ensure Enlive po BID, each supplement provides 350 kcal and 20 grams of protein  MVI daily   Liberalize diet   NUTRITION DIAGNOSIS:   Severe Malnutrition related to chronic illness(COPD) as evidenced by severe fat depletion, severe muscle depletion.  GOAL:   Patient will meet greater than or equal to 90% of their needs  MONITOR:   PO intake, Supplement acceptance, Labs, Weight trends, Skin, I & O's  REASON FOR ASSESSMENT:   Malnutrition Screening Tool    ASSESSMENT:   63 y/o female admitted with COPD exacerbationa and flu    Met with pt in room today. Pt reports good appetite and oral intake today and pta. Pt eating 100% of meals in hospital. Pt does not drink any supplements at home. Per chart, pt appears fairly weight stable pta. RD discussed with pt the importance of adequate nutrition needed with COPD to preserve lean muscle. Recommend supplements daily at home and provided pt with Ensure coupons. RD will add supplements and liberalize pt's diet. Pt to discharge home today.    Medications reviewed and include: aspirin, plavix, doxycycline, lovenox, solu-medrol  Labs reviewed: Na 132(L), BUN 22(H), creat 1.04(H)  NUTRITION - FOCUSED PHYSICAL EXAM:    Most Recent Value  Orbital Region  Severe depletion  Upper Arm Region  Severe depletion  Thoracic and Lumbar Region  Severe depletion  Buccal Region  Severe depletion  Temple Region  Severe depletion  Clavicle Bone Region  Severe depletion  Clavicle and Acromion Bone Region  Severe depletion  Scapular Bone Region  Severe depletion  Dorsal Hand  Severe depletion  Patellar Region  Severe depletion  Anterior Thigh Region  Severe depletion  Posterior Calf Region  Severe depletion  Edema (RD Assessment)  None  Hair  Reviewed  Eyes  Reviewed  Mouth  Reviewed  Skin  Reviewed [ecchymosis ]  Nails   Reviewed     Diet Order:   Diet Order            Diet regular Room service appropriate? Yes; Fluid consistency: Thin  Diet effective now             EDUCATION NEEDS:   Education needs have been addressed  Skin:  Skin Assessment: Reviewed RN Assessment(ecchymosis )  Last BM:  1/21  Height:   Ht Readings from Last 1 Encounters:  09/30/18 5' 4"  (1.626 m)    Weight:   Wt Readings from Last 1 Encounters:  09/30/18 41.7 kg    Ideal Body Weight:  54.5 kg  BMI:  Body mass index is 15.79 kg/m.  Estimated Nutritional Needs:   Kcal:  1300-1500kcal/day   Protein:  63-71g/day   Fluid:  1.2L/day   Koleen Distance MS, RD, LDN Pager #- [phone] Office#- [phone] After Hours Pager: [phone]

## 2018-10-01 NOTE — Consult Note (Signed)
Pharmacy Antibiotic Note  Wendy Ferrell is a 63 y.o. female admitted on 09/30/2018 with pneumonia.  Pharmacy has been consulted for levofloxacin dosing.  Plan: Levofloxacin 750 mg every 48 hours (renally adjusted)  Height: 5\' 4"  (162.6 cm) Weight: 92 lb (41.7 kg) IBW/kg (Calculated) : 54.7  Temp (24hrs), Avg:97.5 F (36.4 C), Min:97.4 F (36.3 C), Max:97.6 F (36.4 C)  Recent Labs  Lab 09/30/18 1220 09/30/18 1701 10/01/18 0435  WBC 9.5 12.3* 4.8  CREATININE 1.16* 0.88 1.04*  LATICACIDVEN 2.1*  --   --     Estimated Creatinine Clearance: 37.9 mL/min (A) (by C-G formula based on SCr of 1.04 mg/dL (H)).    No Known Allergies  Antimicrobials this admission: 1/21 Azithromycin and Ceftriaxone x 1 dose in ED Levofloxacin 1/22 >>   Dose adjustments this admission:   Microbiology results: 1/21 BCx: pending, NGTD <24 hrs    Thank you for allowing pharmacy to be a part of this patient's care.  Forrest Moron, PharmD Clinical Pharmacist 10/01/2018 8:51 AM

## 2018-10-01 NOTE — Discharge Summary (Signed)
Wendy Ferrell at [facility] NAME: Wendy Ferrell    MR#:  [id number]  DATE OF BIRTH:  18-Feb-1956  DATE OF ADMISSION:  09/30/2018 ADMITTING PHYSICIAN: Dustin Flock, MD  DATE OF DISCHARGE: 10/01/2018  PRIMARY CARE PHYSICIAN: Jodi Marble, MD    ADMISSION DIAGNOSIS:  Influenza [J11.1]  DISCHARGE DIAGNOSIS:  Active Problems:   COPD with acute exacerbation ([place])   SECONDARY DIAGNOSIS:   Past Medical History:  Diagnosis Date  . COPD (chronic obstructive pulmonary disease) ([place])   . Hypertension   . Nicotine addiction   . Peripheral vascular disease [facility])     HOSPITAL COURSE:   63 year old female with history of COPD who presented to the hospital due to shortness of breath  weakness.  1.  Influenza A: Patient was treated with Tamiflu and will need 5 days of therapy. This in addition to community-acquired pneumonia was etiology of patient's shortness of breath and generalized weakness.   2.  Community-acquired pneumonia: Patient is started on Levaquin for total 5 days.  3.  Mild COPD exacerbation due to problem #1 and 2: Patient has no wheezing at the time of discharge.  She will be on prednisone for 4 days. She will continue inhaler.  4.  Essential hypertension: Continue  Losartan. Due to low sodium level her chlorthalidone was held.  She needs repeat BMP in 2 to 3 days  And this can be restarted if sodium level has improved.   DISCHARGE CONDITIONS AND DIET:   Stable for discharge on regular diet  CONSULTS OBTAINED:    DRUG ALLERGIES:  No Known Allergies  DISCHARGE MEDICATIONS:   Allergies as of 10/01/2018   No Known Allergies     Medication List    STOP taking these medications   chlorthalidone 25 MG tablet Commonly known as:  HYGROTON     TAKE these medications   aspirin 81 MG chewable tablet Chew by mouth daily.   atorvastatin 40 MG tablet Commonly known as:  LIPITOR Take 40 mg by mouth daily.    benzonatate 200 MG capsule Commonly known as:  TESSALON benzonatate 200 mg capsule   clopidogrel 75 MG tablet Commonly known as:  PLAVIX TAKE 1 TABLET BY MOUTH DAILY   COMBIVENT RESPIMAT 20-100 MCG/ACT Aers respimat Generic drug:  Ipratropium-Albuterol Inhale 1 puff into the lungs 3 (three) times daily.   cyclobenzaprine 5 MG tablet Commonly known as:  FLEXERIL Take 5 mg by mouth 2 (two) times daily.   ibuprofen 200 MG tablet Commonly known as:  ADVIL,MOTRIN Take 200 mg by mouth every 6 (six) hours as needed for moderate pain.   levofloxacin 750 MG tablet Commonly known as:  LEVAQUIN Take 1 tablet (750 mg total) by mouth every other day for 5 days.   losartan 25 MG tablet Commonly known as:  COZAAR Take 25 mg by mouth daily.   oseltamivir 30 MG capsule Commonly known as:  TAMIFLU Take 1 capsule (30 mg total) by mouth 2 (two) times daily for 4 days.   predniSONE 20 MG tablet Commonly known as:  DELTASONE Take 2 tablets (40 mg total) by mouth daily with breakfast for 4 days.   TRELEGY ELLIPTA 100-62.5-25 MCG/INH Aepb Generic drug:  Fluticasone-Umeclidin-Vilant INHALE 1 INHALTION DAILY         Today   CHIEF COMPLAINT:  Patient is doing well this morning.  No shortness of breath, wheezing or cough.   VITAL SIGNS:  Blood pressure (!) 105/43, pulse 77, temperature (!)  97.4 F (36.3 C), temperature source Oral, resp. rate 17, height 5\' 4"  (1.626 m), weight 41.7 kg, SpO2 93 %.   REVIEW OF SYSTEMS:  Review of Systems  Constitutional: Negative.  Negative for chills, fever and malaise/fatigue.  HENT: Negative.  Negative for ear discharge, ear pain, hearing loss, nosebleeds and sore throat.   Eyes: Negative.  Negative for blurred vision and pain.  Respiratory: Negative.  Negative for cough, hemoptysis, shortness of breath and wheezing.   Cardiovascular: Negative.  Negative for chest pain, palpitations and leg swelling.  Gastrointestinal: Negative.  Negative for  abdominal pain, blood in stool, diarrhea, nausea and vomiting.  Genitourinary: Negative.  Negative for dysuria.  Musculoskeletal: Negative.  Negative for back pain.  Skin: Negative.   Neurological: Negative for dizziness, tremors, speech change, focal weakness, seizures and headaches.  Endo/Heme/Allergies: Negative.  Does not bruise/bleed easily.  Psychiatric/Behavioral: Negative.  Negative for depression, hallucinations and suicidal ideas.     PHYSICAL EXAMINATION:  GENERAL:  63 y.o.-year-old patient lying in the bed with no acute distress.  NECK:  Supple, no jugular venous distention. No thyroid enlargement, no tenderness.  LUNGS: Normal breath sounds bilaterally, no wheezing, rales,rhonchi  No use of accessory muscles of respiration.  CARDIOVASCULAR: S1, S2 normal. No murmurs, rubs, or gallops.  ABDOMEN: Soft, non-tender, non-distended. Bowel sounds present. No organomegaly or mass.  EXTREMITIES: No pedal edema, cyanosis, or clubbing.  PSYCHIATRIC: The patient is alert and oriented x 3.  SKIN: No obvious rash, lesion, or ulcer.   DATA REVIEW:   CBC Recent Labs  Lab 10/01/18 0435  WBC 4.8  HGB 13.2  HCT 37.7  PLT 261    Chemistries  Recent Labs  Lab 10/01/18 0435  NA 132*  K 3.5  CL 93*  CO2 28  GLUCOSE 188*  BUN 22*  CREATININE 1.04*  CALCIUM 8.0*    Cardiac Enzymes Recent Labs  Lab 09/30/18 1220  TROPONINI <0.03    Microbiology Results  @[email or web address] @  RADIOLOGY:  Dg Chest 2 View  Result Date: 09/30/2018 CLINICAL DATA:  Shortness of breath, cough for 1 week EXAM: CHEST - 2 VIEW COMPARISON:  08/09/2016 FINDINGS: The lungs are hyperinflated likely secondary to COPD. There is hazy left lower lobe airspace disease at the left costophrenic angle concerning for atelectasis versus pneumonia. There is no pleural effusion or pneumothorax. The heart and mediastinal contours are unremarkable. The osseous structures are unremarkable. IMPRESSION: Hazy left lower lobe  airspace disease at the left costophrenic angle concerning for atelectasis versus pneumonia. Electronically Signed   By: Kathreen Devoid   On: 09/30/2018 12:50      Allergies as of 10/01/2018   No Known Allergies     Medication List    STOP taking these medications   chlorthalidone 25 MG tablet Commonly known as:  HYGROTON     TAKE these medications   aspirin 81 MG chewable tablet Chew by mouth daily.   atorvastatin 40 MG tablet Commonly known as:  LIPITOR Take 40 mg by mouth daily.   benzonatate 200 MG capsule Commonly known as:  TESSALON benzonatate 200 mg capsule   clopidogrel 75 MG tablet Commonly known as:  PLAVIX TAKE 1 TABLET BY MOUTH DAILY   COMBIVENT RESPIMAT 20-100 MCG/ACT Aers respimat Generic drug:  Ipratropium-Albuterol Inhale 1 puff into the lungs 3 (three) times daily.   cyclobenzaprine 5 MG tablet Commonly known as:  FLEXERIL Take 5 mg by mouth 2 (two) times daily.   ibuprofen 200 MG tablet Commonly known  as:  ADVIL,MOTRIN Take 200 mg by mouth every 6 (six) hours as needed for moderate pain.   levofloxacin 750 MG tablet Commonly known as:  LEVAQUIN Take 1 tablet (750 mg total) by mouth every other day for 5 days.   losartan 25 MG tablet Commonly known as:  COZAAR Take 25 mg by mouth daily.   oseltamivir 30 MG capsule Commonly known as:  TAMIFLU Take 1 capsule (30 mg total) by mouth 2 (two) times daily for 4 days.   predniSONE 20 MG tablet Commonly known as:  DELTASONE Take 2 tablets (40 mg total) by mouth daily with breakfast for 4 days.   TRELEGY ELLIPTA 100-62.5-25 MCG/INH Aepb Generic drug:  Fluticasone-Umeclidin-Vilant INHALE 1 INHALTION DAILY          Management plans discussed with the patient and she is in agreement. Stable for discharge home  Patient should follow up with pcp  CODE STATUS:     Code Status Orders  (From admission, onward)         Start     Ordered   09/30/18 1702  Full code  Continuous     09/30/18  1701        Code Status History    Date Active Date Inactive Code Status Order ID Comments User Context   05/28/2017 1530 05/28/2017 2211 Full Code [id number]  Katha Cabal, MD Inpatient   02/14/2015 1345 02/16/2015 1928 Full Code [id number]  Dustin Flock, MD Inpatient      TOTAL TIME TAKING CARE OF THIS PATIENT: 38 minutes.    Note: This dictation was prepared with Dragon dictation along with smaller phrase technology. Any transcriptional errors that result from this process are unintentional.  Riaan Toledo M.D on 10/01/2018 at 11:33 AM  Between 7am to 6pm - Pager - [phone] After 6pm go to [email or web address] - password [email or web address]  [facility]  Office  [phone]  CC: Primary care physician; Jodi Marble, MD

## 2018-10-05 LAB — CULTURE, BLOOD (ROUTINE X 2)
Culture: NO GROWTH
Culture: NO GROWTH
Special Requests: ADEQUATE
Special Requests: ADEQUATE

## 2019-03-19 ENCOUNTER — Other Ambulatory Visit (INDEPENDENT_AMBULATORY_CARE_PROVIDER_SITE_OTHER): Payer: Self-pay | Admitting: Vascular Surgery

## 2019-07-09 ENCOUNTER — Ambulatory Visit (INDEPENDENT_AMBULATORY_CARE_PROVIDER_SITE_OTHER): Payer: 59 | Admitting: Vascular Surgery

## 2019-07-09 ENCOUNTER — Encounter (INDEPENDENT_AMBULATORY_CARE_PROVIDER_SITE_OTHER): Payer: 59

## 2019-09-16 ENCOUNTER — Other Ambulatory Visit (INDEPENDENT_AMBULATORY_CARE_PROVIDER_SITE_OTHER): Payer: Self-pay | Admitting: Vascular Surgery

## 2019-09-17 NOTE — Telephone Encounter (Signed)
Patient was last seen in 10/19. Her apt in 06/2019 was cancelled. Please advise refill.

## 2019-09-17 NOTE — Telephone Encounter (Signed)
We can refill 30 days and she will need an appointment for follow up with previously scheduled tests.

## 2020-01-04 ENCOUNTER — Other Ambulatory Visit: Payer: Self-pay | Admitting: Internal Medicine

## 2020-01-04 DIAGNOSIS — Z1231 Encounter for screening mammogram for malignant neoplasm of breast: Secondary | ICD-10-CM

## 2020-01-12 ENCOUNTER — Ambulatory Visit
Admission: RE | Admit: 2020-01-12 | Discharge: 2020-01-12 | Disposition: A | Discharge status: Home / Self Care | Payer: 59 | Source: Ambulatory Visit | Attending: Internal Medicine | Admitting: Internal Medicine

## 2020-01-12 DIAGNOSIS — Z1231 Encounter for screening mammogram for malignant neoplasm of breast: Secondary | ICD-10-CM | POA: Diagnosis not present

## 2020-03-30 ENCOUNTER — Emergency Department: Payer: No Typology Code available for payment source

## 2020-03-30 ENCOUNTER — Other Ambulatory Visit: Payer: Self-pay

## 2020-03-30 ENCOUNTER — Inpatient Hospital Stay
Admission: EM | Admit: 2020-03-30 | Discharge: 2020-04-04 | DRG: 643 | Disposition: A | Discharge status: Home / Self Care | Payer: No Typology Code available for payment source | Attending: Internal Medicine | Admitting: Internal Medicine

## 2020-03-30 DIAGNOSIS — Z0184 Encounter for antibody response examination: Secondary | ICD-10-CM | POA: Diagnosis not present

## 2020-03-30 DIAGNOSIS — R400 Somnolence: Secondary | ICD-10-CM

## 2020-03-30 DIAGNOSIS — G9341 Metabolic encephalopathy: Secondary | ICD-10-CM | POA: Diagnosis present

## 2020-03-30 DIAGNOSIS — Z7982 Long term (current) use of aspirin: Secondary | ICD-10-CM | POA: Diagnosis not present

## 2020-03-30 DIAGNOSIS — Z20822 Contact with and (suspected) exposure to covid-19: Secondary | ICD-10-CM | POA: Diagnosis present

## 2020-03-30 DIAGNOSIS — E162 Hypoglycemia, unspecified: Secondary | ICD-10-CM | POA: Diagnosis present

## 2020-03-30 DIAGNOSIS — J432 Centrilobular emphysema: Secondary | ICD-10-CM | POA: Diagnosis present

## 2020-03-30 DIAGNOSIS — E785 Hyperlipidemia, unspecified: Secondary | ICD-10-CM | POA: Diagnosis present

## 2020-03-30 DIAGNOSIS — F17210 Nicotine dependence, cigarettes, uncomplicated: Secondary | ICD-10-CM | POA: Diagnosis present

## 2020-03-30 DIAGNOSIS — I739 Peripheral vascular disease, unspecified: Secondary | ICD-10-CM | POA: Diagnosis present

## 2020-03-30 DIAGNOSIS — Z825 Family history of asthma and other chronic lower respiratory diseases: Secondary | ICD-10-CM | POA: Diagnosis not present

## 2020-03-30 DIAGNOSIS — Z8249 Family history of ischemic heart disease and other diseases of the circulatory system: Secondary | ICD-10-CM

## 2020-03-30 DIAGNOSIS — R4182 Altered mental status, unspecified: Secondary | ICD-10-CM | POA: Diagnosis not present

## 2020-03-30 DIAGNOSIS — E871 Hypo-osmolality and hyponatremia: Secondary | ICD-10-CM

## 2020-03-30 DIAGNOSIS — Z7902 Long term (current) use of antithrombotics/antiplatelets: Secondary | ICD-10-CM | POA: Diagnosis not present

## 2020-03-30 DIAGNOSIS — J449 Chronic obstructive pulmonary disease, unspecified: Secondary | ICD-10-CM | POA: Diagnosis not present

## 2020-03-30 DIAGNOSIS — E876 Hypokalemia: Secondary | ICD-10-CM

## 2020-03-30 DIAGNOSIS — J441 Chronic obstructive pulmonary disease with (acute) exacerbation: Secondary | ICD-10-CM | POA: Diagnosis not present

## 2020-03-30 DIAGNOSIS — E222 Syndrome of inappropriate secretion of antidiuretic hormone: Principal | ICD-10-CM | POA: Diagnosis present

## 2020-03-30 DIAGNOSIS — I10 Essential (primary) hypertension: Secondary | ICD-10-CM | POA: Diagnosis present

## 2020-03-30 DIAGNOSIS — Y929 Unspecified place or not applicable: Secondary | ICD-10-CM | POA: Diagnosis not present

## 2020-03-30 DIAGNOSIS — Z79899 Other long term (current) drug therapy: Secondary | ICD-10-CM | POA: Diagnosis not present

## 2020-03-30 DIAGNOSIS — R569 Unspecified convulsions: Secondary | ICD-10-CM | POA: Diagnosis not present

## 2020-03-30 DIAGNOSIS — T481X1A Poisoning by skeletal muscle relaxants [neuromuscular blocking agents], accidental (unintentional), initial encounter: Secondary | ICD-10-CM | POA: Diagnosis present

## 2020-03-30 LAB — COMPREHENSIVE METABOLIC PANEL
ALT: 16 U/L (ref 0–44)
AST: 28 U/L (ref 15–41)
Albumin: 3.6 g/dL (ref 3.5–5.0)
Alkaline Phosphatase: 63 U/L (ref 38–126)
Anion gap: 12 (ref 5–15)
BUN: <5 mg/dL — ABNORMAL LOW (ref 8–23)
CO2: 33 mmol/L — ABNORMAL HIGH (ref 22–32)
Calcium: 7.9 mg/dL — ABNORMAL LOW (ref 8.9–10.3)
Chloride: 69 mmol/L — ABNORMAL LOW (ref 98–111)
Creatinine, Ser: 0.6 mg/dL (ref 0.44–1.00)
GFR calc Af Amer: >60 mL/min (ref >60)
GFR calc non Af Amer: >60 mL/min (ref >60)
Glucose, Bld: 92 mg/dL (ref 70–99)
Potassium: 2.2 mmol/L — CL (ref 3.5–5.1)
Sodium: 114 mmol/L — CL (ref 135–145)
Total Bilirubin: 1.3 mg/dL — ABNORMAL HIGH (ref 0.3–1.2)
Total Protein: 5.8 g/dL — ABNORMAL LOW (ref 6.5–8.1)

## 2020-03-30 LAB — CBC WITH DIFFERENTIAL/PLATELET
Abs Immature Granulocytes: 0.02 10*3/uL (ref 0.00–0.07)
Abs Immature Granulocytes: 0.01 10*3/uL (ref 0.00–0.07)
Basophils Absolute: 0 10*3/uL (ref 0.0–0.1)
Basophils Absolute: 0 10*3/uL (ref 0.0–0.1)
Basophils Relative: 1 %
Basophils Relative: 1 %
Eosinophils Absolute: 0.1 10*3/uL (ref 0.0–0.5)
Eosinophils Absolute: 0.2 10*3/uL (ref 0.0–0.5)
Eosinophils Relative: 1 %
Eosinophils Relative: 2 %
Hemoglobin: 13.1 g/dL (ref 12.0–15.0)
Immature Granulocytes: 0 %
Immature Granulocytes: 0 %
Lymphocytes Relative: 25 %
Lymphocytes Relative: 27 %
Lymphs Abs: 1.6 10*3/uL (ref 0.7–4.0)
Lymphs Abs: 1.7 10*3/uL (ref 0.7–4.0)
Monocytes Absolute: 0.6 10*3/uL (ref 0.1–1.0)
Monocytes Absolute: 0.6 10*3/uL (ref 0.1–1.0)
Monocytes Relative: 10 %
Monocytes Relative: 10 %
Neutro Abs: 3.9 10*3/uL (ref 1.7–7.7)
Neutro Abs: 3.7 10*3/uL (ref 1.7–7.7)
Neutrophils Relative %: 63 %
Neutrophils Relative %: 60 %
Platelets: 230 10*3/uL (ref 150–400)
Platelets: 222 10*3/uL (ref 150–400)
RBC: 13.1 MIL/uL — ABNORMAL HIGH (ref 3.87–5.11)
WBC: 6.2 10*3/uL (ref 4.0–10.5)
WBC: 6.2 10*3/uL (ref 4.0–10.5)
nRBC: 0 % (ref 0.0–0.2)
nRBC: 0 % (ref 0.0–0.2)

## 2020-03-30 LAB — URINALYSIS, COMPLETE (UACMP) WITH MICROSCOPIC
Bilirubin Urine: NEGATIVE
Glucose, UA: NEGATIVE mg/dL
Ketones, ur: NEGATIVE mg/dL
Nitrite: POSITIVE — AB
Protein, ur: NEGATIVE mg/dL
Specific Gravity, Urine: 1.005 (ref 1.005–1.030)
pH: 6 (ref 5.0–8.0)

## 2020-03-30 LAB — SALICYLATE LEVEL: Salicylate Lvl: <7 mg/dL — ABNORMAL LOW (ref 7.0–30.0)

## 2020-03-30 LAB — URINE DRUG SCREEN, QUALITATIVE (ARMC ONLY)
Amphetamines, Ur Screen: NOT DETECTED
Barbiturates, Ur Screen: NOT DETECTED
Benzodiazepine, Ur Scrn: NOT DETECTED
Cannabinoid 50 Ng, Ur ~~LOC~~: NOT DETECTED
Cocaine Metabolite,Ur ~~LOC~~: NOT DETECTED
MDMA (Ecstasy)Ur Screen: NOT DETECTED
Methadone Scn, Ur: NOT DETECTED
Opiate, Ur Screen: NOT DETECTED
Phencyclidine (PCP) Ur S: NOT DETECTED
Tricyclic, Ur Screen: POSITIVE — AB

## 2020-03-30 LAB — BASIC METABOLIC PANEL
Anion gap: 12 (ref 5–15)
BUN: <5 mg/dL — ABNORMAL LOW (ref 8–23)
CO2: 35 mmol/L — ABNORMAL HIGH (ref 22–32)
Calcium: 8.3 mg/dL — ABNORMAL LOW (ref 8.9–10.3)
Chloride: 69 mmol/L — ABNORMAL LOW (ref 98–111)
Creatinine, Ser: 0.67 mg/dL (ref 0.44–1.00)
GFR calc Af Amer: >60 mL/min (ref >60)
GFR calc non Af Amer: >60 mL/min (ref >60)
Glucose, Bld: 93 mg/dL (ref 70–99)
Potassium: 2.3 mmol/L — CL (ref 3.5–5.1)
Sodium: 116 mmol/L — CL (ref 135–145)

## 2020-03-30 LAB — ACETAMINOPHEN LEVEL: Acetaminophen (Tylenol), Serum: <10 ug/mL — ABNORMAL LOW (ref 10–30)

## 2020-03-30 LAB — ETHANOL: Alcohol, Ethyl (B): <10 mg/dL (ref <10)

## 2020-03-30 LAB — MAGNESIUM: Magnesium: 1.9 mg/dL (ref 1.7–2.4)

## 2020-03-30 MED ORDER — IPRATROPIUM-ALBUTEROL 0.5-2.5 (3) MG/3ML IN SOLN
3.0000 mL | Freq: Once | RESPIRATORY_TRACT | Status: AC
  Administered 2020-03-30: 3 mL via RESPIRATORY_TRACT

## 2020-03-30 MED ORDER — SODIUM CHLORIDE 0.9 % IV BOLUS
1000.0000 mL | Freq: Once | INTRAVENOUS | Status: AC
  Administered 2020-03-30: 1000 mL via INTRAVENOUS

## 2020-03-30 MED ORDER — DOCUSATE SODIUM 100 MG PO CAPS: 100.0000 mg | ORAL_CAPSULE | Freq: Two times a day (BID) | ORAL | Status: DC | PRN

## 2020-03-30 MED ORDER — POTASSIUM CHLORIDE IN NACL 20-0.9 MEQ/L-% IV SOLN
INTRAVENOUS | Status: DC
  Filled 2020-03-30 (×2): qty 1000

## 2020-03-30 MED ORDER — SODIUM CHLORIDE 3 % IV BOLUS
100.0000 mL | Freq: Once | INTRAVENOUS | Status: AC
  Administered 2020-03-30: 100 mL via INTRAVENOUS
  Filled 2020-03-30: qty 100

## 2020-03-30 MED ORDER — NALOXONE HCL 2 MG/2ML IJ SOSY
PREFILLED_SYRINGE | INTRAMUSCULAR | Status: AC
  Filled 2020-03-30: qty 2

## 2020-03-30 MED ORDER — HEPARIN SODIUM (PORCINE) 5000 UNIT/ML IJ SOLN
5000.0000 [IU] | Freq: Three times a day (TID) | INTRAMUSCULAR | Status: DC
  Administered 2020-03-31 – 2020-04-04 (×15): 5000 [IU] via SUBCUTANEOUS
  Filled 2020-03-30 (×15): qty 1

## 2020-03-30 MED ORDER — IPRATROPIUM-ALBUTEROL 0.5-2.5 (3) MG/3ML IN SOLN
3.0000 mL | RESPIRATORY_TRACT | Status: DC | PRN
  Administered 2020-03-30 – 2020-03-31 (×2): 3 mL via RESPIRATORY_TRACT
  Filled 2020-03-30 (×3): qty 3

## 2020-03-30 MED ORDER — POTASSIUM CHLORIDE 10 MEQ/100ML IV SOLN
10.0000 meq | INTRAVENOUS | Status: AC
  Administered 2020-03-30 (×4): 10 meq via INTRAVENOUS
  Filled 2020-03-30: qty 100

## 2020-03-30 MED ORDER — POLYETHYLENE GLYCOL 3350 17 G PO PACK: 17.0000 g | PACK | Freq: Every day | ORAL | Status: DC | PRN

## 2020-03-30 MED ORDER — METHYLPREDNISOLONE SODIUM SUCC 40 MG IJ SOLR
40.0000 mg | Freq: Three times a day (TID) | INTRAMUSCULAR | Status: DC
  Administered 2020-03-30 – 2020-03-31 (×3): 40 mg via INTRAVENOUS
  Filled 2020-03-30 (×3): qty 1

## 2020-03-30 MED ORDER — MAGNESIUM SULFATE 2 GM/50ML IV SOLN
2.0000 g | Freq: Once | INTRAVENOUS | Status: AC
  Administered 2020-03-30: 2 g via INTRAVENOUS
  Filled 2020-03-30: qty 50

## 2020-03-30 MED ORDER — CHLORHEXIDINE GLUCONATE CLOTH 2 % EX PADS
6.0000 | MEDICATED_PAD | Freq: Every day | CUTANEOUS | Status: DC
  Administered 2020-03-30 – 2020-04-04 (×6): 6 via TOPICAL

## 2020-03-30 MED ORDER — SODIUM CHLORIDE 0.9 % IV SOLN
3.0000 g | Freq: Four times a day (QID) | INTRAVENOUS | Status: DC
  Administered 2020-03-31 – 2020-04-04 (×18): 3 g via INTRAVENOUS
  Filled 2020-03-30: qty 8
  Filled 2020-03-30 (×6): qty 3
  Filled 2020-03-30 (×2): qty 8
  Filled 2020-03-30: qty 3
  Filled 2020-03-30: qty 8
  Filled 2020-03-30 (×6): qty 3
  Filled 2020-03-30: qty 8
  Filled 2020-03-30 (×2): qty 3
  Filled 2020-03-30 (×2): qty 8

## 2020-03-30 NOTE — H&P (Signed)
PCCM ADMISSION  Chief Complaint: altered mental status Referring Physician: [facility], [facility] ED  Assessment Altered Mental Status Hyponatremia Acute Exacerbation of COPD Possible Aspiration  Hypokalemia  Plan: Slow correction of sodium, discontinue 3% saline orders in favor of simple 0.9%NS with q4h determination Replete potassium and magnesium Suspect elevated bicarbonate may be at least partially chronic  Duoneb for acute wheeze Solumedrol 40mg  IV q8h for now Unasyn for possible aspiration ABG if not improved with nebs Resume home inhaled regimen once more alert Question of RUL asymmetry on admission may require CT once more alert, in w/u for sodium  HPI:  Wendy Ferrell is a 64 yo WF who presented via the [facility] ED to the ICU for altered mental status. The patient was transported via EMS after having been called by the patient's husband. Upon squad arrival, they noted Wendy Ferrell to be somnolent with two unidentified tablets nearby. Narcan was administered with little benefit. Oxygen was administered as her initial saturations were in the mid-80's. The patient is known to have COPD, there is some question as to her potential home oxygen need at baseline. There are no benzos or narcotics prescribed to her, and a UDS was notable for tricyclics only, however she does take flexeril. Initial lab evaluation revealed a metabolic disarray with low sodium of 114 and hypokalemia. No diuretic medication was noted on her list and her ethanol level was <10. A presenting blood gas was not available, however a CXR was without much acute finding. She was wheezing and rhonchorous on her admission to the ICU.  Past Medical History:  Diagnosis Date  . COPD (chronic obstructive pulmonary disease) ([place])   . Hypertension   . Nicotine addiction   . Peripheral vascular disease [facility])     Past Surgical History:  Procedure Laterality Date  . ABDOMINAL HYSTERECTOMY    . COLONOSCOPY WITH PROPOFOL N/A  02/21/2016   Procedure: COLONOSCOPY WITH PROPOFOL;  Surgeon: Lollie Sails, MD;  Location: [facility] ENDOSCOPY;  Service: Endoscopy;  Laterality: N/A;  . LOWER EXTREMITY ANGIOGRAPHY Right 05/28/2017   Procedure: Lower Extremity Angiography;  Surgeon: Katha Cabal, MD;  Location: [place] CV LAB;  Service: Cardiovascular;  Laterality: Right;  . MASTOIDECTOMY    . NECK SURGERY    . PERIPHERAL VASCULAR CATHETERIZATION Left 05/29/2016   Procedure: Lower Extremity Angiography;  Surgeon: Katha Cabal, MD;  Location: [place] CV LAB;  Service: Cardiovascular;  Laterality: Left;  . PERIPHERAL VASCULAR CATHETERIZATION  05/29/2016   Procedure: Lower Extremity Intervention;  Surgeon: Katha Cabal, MD;  Location: [place] CV LAB;  Service: Cardiovascular;;  . TONSILLECTOMY      Family History  Problem Relation Age of Onset  . COPD Mother   . Hypertension Mother   . COPD Father   . Breast cancer Maternal Grandmother 60  . Breast cancer Cousin 50       maternal side   Social History:  reports that she has quit smoking. Her smoking use included cigarettes. She has a 22.50 pack-year smoking history. She has never used smokeless tobacco. She reports that she does not drink alcohol and does not use drugs.  Allergies: No Known Allergies  Medications Prior to Admission  Medication Sig Dispense Refill  . aspirin 81 MG chewable tablet Chew by mouth daily.    Marland Kitchen atorvastatin (LIPITOR) 40 MG tablet Take 40 mg by mouth daily.    . clopidogrel (PLAVIX) 75 MG tablet TAKE 1 TABLET BY MOUTH EVERY DAY (Patient taking differently: Take  75 mg by mouth daily. ) 90 tablet 1  . losartan (COZAAR) 25 MG tablet Take 25 mg by mouth daily.    . TRELEGY ELLIPTA 100-62.5-25 MCG/INH AEPB INHALE 1 INHALTION DAILY  3  . COMBIVENT RESPIMAT 20-100 MCG/ACT AERS respimat Inhale 1 puff into the lungs 3 (three) times daily.  (Patient not taking: Reported on 03/30/2020)    . ibuprofen (ADVIL,MOTRIN) 200 MG  tablet Take 200 mg by mouth every 6 (six) hours as needed for moderate pain.      Results for orders placed or performed during the hospital encounter of 03/30/20 (from the past 48 hour(s))  CBC with Differential     Status: Abnormal   Collection Time: 03/30/20  7:25 PM  Result Value Ref Range   WBC 6.2 4.0 - 10.5 K/uL   RBC 13.10 (H) 3.87 - 5.11 MIL/uL   Hemoglobin RESULTS UNAVAILABLE DUE TO INTERFERING SUBSTANCE 12.0 - 15.0 g/dL   HCT RESULTS UNAVAILABLE DUE TO INTERFERING SUBSTANCE 36 - 46 %   MCV RESULTS UNAVAILABLE DUE TO INTERFERING SUBSTANCE 80.0 - 100.0 fL   MCH RESULTS UNAVAILABLE DUE TO INTERFERING SUBSTANCE 26.0 - 34.0 pg   MCHC RESULTS UNAVAILABLE DUE TO INTERFERING SUBSTANCE 30.0 - 36.0 g/dL   RDW RESULTS UNAVAILABLE DUE TO INTERFERING SUBSTANCE 11.5 - 15.5 %   Platelets 230 150 - 400 K/uL   nRBC 0.0 0.0 - 0.2 %   Neutrophils Relative % 63 %   Neutro Abs 3.9 1.7 - 7.7 K/uL   Lymphocytes Relative 25 %   Lymphs Abs 1.6 0.7 - 4.0 K/uL   Monocytes Relative 10 %   Monocytes Absolute 0.6 0 - 1 K/uL   Eosinophils Relative 1 %   Eosinophils Absolute 0.1 0 - 0 K/uL   Basophils Relative 1 %   Basophils Absolute 0.0 0 - 0 K/uL   WBC Morphology MORPHOLOGY UNREMARKABLE    RBC Morphology MORPHOLOGY UNREMARKABLE    Immature Granulocytes 0 %   Abs Immature Granulocytes 0.02 0.00 - 0.07 K/uL    Comment: Performed at [facility], [address] [place], [place] [address]  Comprehensive metabolic panel     Status: Abnormal   Collection Time: 03/30/20  7:25 PM  Result Value Ref Range   Sodium 114 (LL) 135 - 145 mmol/L    Comment: CRITICAL RESULT CALLED TO, READ BACK BY AND VERIFIED WITH NOAH GRIFFITH @2003  03/30/20 MJU    Potassium 2.2 (LL) 3.5 - 5.1 mmol/L    Comment: CRITICAL RESULT CALLED TO, READ BACK BY AND VERIFIED WITH NOAH GRIFFITH @2003  03/30/20 MJU    Chloride 69 (L) 98 - 111 mmol/L   CO2 33 (H) 22 - 32 mmol/L   Glucose, Bld 92 70 - 99 mg/dL    Comment:  Glucose reference range applies only to samples taken after fasting for at least 8 hours.   BUN <5 (L) 8 - 23 mg/dL   Creatinine, Ser 0.60 0.44 - 1.00 mg/dL   Calcium 7.9 (L) 8.9 - 10.3 mg/dL   Total Protein 5.8 (L) 6.5 - 8.1 g/dL   Albumin 3.6 3.5 - 5.0 g/dL   AST 28 15 - 41 U/L   ALT 16 0 - 44 U/L   Alkaline Phosphatase 63 38 - 126 U/L   Total Bilirubin 1.3 (H) 0.3 - 1.2 mg/dL   GFR calc non Af Amer >60 >60 mL/min   GFR calc Af Amer >60 >60 mL/min   Anion gap 12 5 - 15  Comment: Performed at [facility], [address] [place], [place] [address]  Urine Drug Screen, [facility] only)     Status: Abnormal   Collection Time: 03/30/20  7:25 PM  Result Value Ref Range   Tricyclic, Ur Screen POSITIVE (A) NONE DETECTED   Amphetamines, Ur Screen NONE DETECTED NONE DETECTED   MDMA (Ecstasy)Ur Screen NONE DETECTED NONE DETECTED   Cocaine Metabolite,Ur [place] NONE DETECTED NONE DETECTED   Opiate, Ur Screen NONE DETECTED NONE DETECTED   Phencyclidine (PCP) Ur S NONE DETECTED NONE DETECTED   Cannabinoid 50 Ng, Ur [place] NONE DETECTED NONE DETECTED   Barbiturates, Ur Screen NONE DETECTED NONE DETECTED   Benzodiazepine, Ur Scrn NONE DETECTED NONE DETECTED   Methadone Scn, Ur NONE DETECTED NONE DETECTED    Comment: (NOTE) Tricyclics + metabolites, urine    Cutoff 1000 ng/mL Amphetamines + metabolites, urine  Cutoff 1000 ng/mL MDMA (Ecstasy), urine              Cutoff 500 ng/mL Cocaine Metabolite, urine          Cutoff 300 ng/mL Opiate + metabolites, urine        Cutoff 300 ng/mL Phencyclidine (PCP), urine         Cutoff 25 ng/mL Cannabinoid, urine                 Cutoff 50 ng/mL Barbiturates + metabolites, urine  Cutoff 200 ng/mL Benzodiazepine, urine              Cutoff 200 ng/mL Methadone, urine                   Cutoff 300 ng/mL  The urine drug screen provides only a preliminary, unconfirmed analytical test result and should not be used for non-medical purposes. Clinical  consideration and professional judgment should be applied to any positive drug screen result due to possible interfering substances. A more specific alternate chemical method must be used in order to obtain a confirmed analytical result. Gas chromatography / mass spectrometry (GC/MS) is the preferred confirm atory method. Performed at [facility], [address] [place], [place] [address]   Ethanol     Status: None   Collection Time: 03/30/20  7:25 PM  Result Value Ref Range   Alcohol, Ethyl (B) <10 <10 mg/dL    Comment: (NOTE) Lowest detectable limit for serum alcohol is 10 mg/dL.  For medical purposes only. Performed at [facility], [address] [place], [place] [address]   Acetaminophen level     Status: Abnormal   Collection Time: 03/30/20  7:25 PM  Result Value Ref Range   Acetaminophen (Tylenol), Serum <10 (L) 10 - 30 ug/mL    Comment: (NOTE) Therapeutic concentrations vary significantly. A range of 10-30 ug/mL  may be an effective concentration for many patients. However, some  are best treated at concentrations outside of this range. Acetaminophen concentrations >150 ug/mL at 4 hours after ingestion  and >50 ug/mL at 12 hours after ingestion are often associated with  toxic reactions.  Performed at [facility], [address] [place], [place] [address]   Salicylate level     Status: Abnormal   Collection Time: 03/30/20  7:25 PM  Result Value Ref Range   Salicylate Lvl <2.4 (L) 7.0 - 30.0 mg/dL    Comment: Performed at [facility], [address] [place], [place] [address]  Urinalysis, Complete w Microscopic     Status: Abnormal   Collection  Time: 03/30/20  7:25 PM  Result Value Ref Range   Color, Urine YELLOW (A) YELLOW   APPearance HAZY (A) CLEAR   Specific Gravity, Urine 1.005 1.005 - 1.030   pH 6.0 5.0 - 8.0   Glucose, UA NEGATIVE NEGATIVE mg/dL   Hgb urine dipstick MODERATE (A) NEGATIVE   Bilirubin Urine  NEGATIVE NEGATIVE   Ketones, ur NEGATIVE NEGATIVE mg/dL   Protein, ur NEGATIVE NEGATIVE mg/dL   Nitrite POSITIVE (A) NEGATIVE   Leukocytes,Ua TRACE (A) NEGATIVE   RBC / HPF 6-10 0 - 5 RBC/hpf   WBC, UA 0-5 0 - 5 WBC/hpf   Bacteria, UA FEW (A) NONE SEEN   Squamous Epithelial / LPF 0-5 0 - 5    Comment: Performed at [facility], [address] [place], [place] [address]  Magnesium     Status: None   Collection Time: 03/30/20  9:00 PM  Result Value Ref Range   Magnesium 1.9 1.7 - 2.4 mg/dL    Comment: Performed at [facility], [address] [place], [place] [address]  Basic metabolic panel     Status: Abnormal   Collection Time: 03/30/20  9:00 PM  Result Value Ref Range   Sodium 116 (LL) 135 - 145 mmol/L    Comment: CRITICAL RESULT CALLED TO, READ BACK BY AND VERIFIED WITH NOAH GRISFITH @ 2156 ON 03/30/2020 RH    Potassium 2.3 (LL) 3.5 - 5.1 mmol/L    Comment: CRITICAL RESULT CALLED TO, READ BACK BY AND VERIFIED WITH NOAH GRISFITH @ 2156 ON 03/30/2020 RH    Chloride 69 (L) 98 - 111 mmol/L   CO2 35 (H) 22 - 32 mmol/L   Glucose, Bld 93 70 - 99 mg/dL    Comment: Glucose reference range applies only to samples taken after fasting for at least 8 hours.   BUN <5 (L) 8 - 23 mg/dL   Creatinine, Ser 0.67 0.44 - 1.00 mg/dL   Calcium 8.3 (L) 8.9 - 10.3 mg/dL   GFR calc non Af Amer >60 >60 mL/min   GFR calc Af Amer >60 >60 mL/min   Anion gap 12 5 - 15    Comment: Performed at [facility], [address] [place], [place] [address]  CBC with Differential     Status: None   Collection Time: 03/30/20  9:10 PM  Result Value Ref Range   WBC 6.2 4.0 - 10.5 K/uL   RBC RESULTS UNAVAILABLE DUE TO INTERFERING SUBSTANCE 3.87 - 5.11 MIL/uL    Comment: C/NOAH GRIFFITH 03/30/20 AT 2245 BY SKL/AR CORRECTED ON 07/21 AT 2247: PREVIOUSLY REPORTED AS 3.76    Hemoglobin 13.1 12.0 - 15.0 g/dL   HCT RESULTS UNAVAILABLE DUE TO INTERFERING SUBSTANCE 36 - 46 %    Comment:  C/NOAH GRIFFITH 03/30/20 AT 2245 BY SKL/AR CORRECTED ON 07/21 AT 2247: PREVIOUSLY REPORTED AS 32.9    MCV RESULTS UNAVAILABLE DUE TO INTERFERING SUBSTANCE 80.0 - 100.0 fL    Comment: C/NOAH GRIFFITH 03/30/20 AT 2245 BY SKL/AR CORRECTED ON 07/21 AT 2247: PREVIOUSLY REPORTED AS 87.5    MCH RESULTS UNAVAILABLE DUE TO INTERFERING SUBSTANCE 26.0 - 34.0 pg    Comment: C/NOAH GRIFFITH 03/30/20 AT 2245 BY SKL/AR CORRECTED ON 07/21 AT 2247: PREVIOUSLY REPORTED AS 34.8    MCHC RESULTS UNAVAILABLE DUE TO INTERFERING SUBSTANCE 30.0 - 36.0 g/dL    Comment: C/NOAH GRIFFITH 03/30/20 AT 2245 BY SKL/AR CORRECTED ON 07/21 AT 2247: PREVIOUSLY REPORTED AS 39.8    RDW RESULTS  UNAVAILABLE DUE TO INTERFERING SUBSTANCE 11.5 - 15.5 %    Comment: C/NOAH GRIFFITH 03/30/20 AT 2245 BY SKL/AR CORRECTED ON 07/21 AT 2247: PREVIOUSLY REPORTED AS 11.9    Platelets 222 150 - 400 K/uL   nRBC 0.0 0.0 - 0.2 %   Neutrophils Relative % 60 %   Neutro Abs 3.7 1.7 - 7.7 K/uL   Lymphocytes Relative 27 %   Lymphs Abs 1.7 0.7 - 4.0 K/uL   Monocytes Relative 10 %   Monocytes Absolute 0.6 0 - 1 K/uL   Eosinophils Relative 2 %   Eosinophils Absolute 0.2 0 - 0 K/uL   Basophils Relative 1 %   Basophils Absolute 0.0 0 - 0 K/uL   Immature Granulocytes 0 %   Abs Immature Granulocytes 0.01 0.00 - 0.07 K/uL    Comment: Performed at [facility], [address] [place], [place] [address]   CT Head Wo Contrast  Result Date: 03/30/2020 CLINICAL DATA:  Unresponsive possible overdose EXAM: CT HEAD WITHOUT CONTRAST TECHNIQUE: Contiguous axial images were obtained from the base of the skull through the vertex without intravenous contrast. COMPARISON:  MRI 08/09/2016, CT brain 08/09/2016 FINDINGS: Brain: No evidence of acute infarction, hemorrhage, hydrocephalus, extra-axial collection or mass lesion/mass effect. Vascular: No hyperdense vessel.  Carotid vascular calcification. Skull: Normal. Negative for fracture or focal lesion.  Sinuses/Orbits: No acute finding. Other: None IMPRESSION: Negative non contrasted CT appearance of the brain. Electronically Signed   By: Donavan Foil M.D.   On: 03/30/2020 20:07   DG Chest Portable 1 View  Result Date: 03/30/2020 CLINICAL DATA:  Altered mental status EXAM: PORTABLE CHEST 1 VIEW COMPARISON:  09/30/2018, 08/09/2016 FINDINGS: Hyperinflation with probable emphysematous disease. Stable cardiomediastinal silhouette. Mild asymmetric density at the right apex. No pneumothorax. IMPRESSION: 1. Hyperinflation with probable emphysematous disease. 2. Slight asymmetric opacity at the right lung apex. Chest CT may be considered for further evaluation to exclude airspace disease or mass at the right apex. Electronically Signed   By: Donavan Foil M.D.   On: 03/30/2020 22:14    Review of Systems  Unable to perform ROS: Mental status change    Blood pressure 122/70, pulse 90, temperature 98.4 F (36.9 C), temperature source Axillary, resp. rate (!) 23, height 5\' 4"  (1.626 m), weight 44.4 kg, SpO2 95 %. Physical Exam Constitutional:      Appearance: She is ill-appearing.     Comments: Quite thin. Stuporous  HENT:     Head: Normocephalic and atraumatic.     Mouth/Throat:     Mouth: Mucous membranes are moist.  Eyes:     Extraocular Movements: Extraocular movements intact.     Pupils: Pupils are equal, round, and reactive to light.  Cardiovascular:     Rate and Rhythm: Normal rate. Occasional extrasystoles are present.    Pulses: Normal pulses.     Heart sounds: Normal heart sounds.  Pulmonary:     Effort: Prolonged expiration present. No respiratory distress.     Breath sounds: Decreased air movement present. Decreased breath sounds, wheezing and rhonchi present. No rales.  Abdominal:     General: Abdomen is flat. Bowel sounds are decreased. There is no distension.     Tenderness: There is no abdominal tenderness. There is no guarding or rebound.  Musculoskeletal:     Right lower  leg: No edema.     Left lower leg: No edema.  Neurological:     General: No focal deficit present.     Mental Status:  She is lethargic.     GCS: GCS eye subscore is 2. GCS verbal subscore is 3. GCS motor subscore is 5.      Tyler, MD 03/30/2020, 11:51 PM

## 2020-03-30 NOTE — Progress Notes (Signed)
Patient admitted to [facility] from ED. Report received from [place], [place]. NS Fluid bolus and potassium infusing on arrival. Hooked up to monitor, VSS. Patient is response to stimulation on arrival. Patient has audible wheezing, albuterol ordered and given by RT. Dr. Merrilee Jansky at bedside. Magnesium given and NS+potassium started with follow up labs ordered.

## 2020-03-30 NOTE — ED Provider Notes (Signed)
[facility] Emergency Department Provider Note   ____________________________________________   I have reviewed the triage vital signs and the nursing notes.   HISTORY  Chief Complaint Altered mental status, overdose  History limited by and level 5 caveat due to: Altered Mental Status   HPI Wendy Ferrell is a 64 y.o. female who presents to the emergency department today because of apparent overdose and altered mental status.  Patient is unable to give any history.  Per EMS report the patient was found with 2 unknown pills by her.  Initial oxygen saturation was in the 80s.  They did give 4 mg of Narcan which improved her O2 saturation and respiratory effort.   Records reviewed. Per medical record review patient has a history of COPD, nicotine addiction. No recently filled narcotic pain prescriptions in [place].  Past Medical History:  Diagnosis Date  . COPD (chronic obstructive pulmonary disease) ([place])   . Hypertension   . Nicotine addiction   . Peripheral vascular disease [facility])     Patient Active Problem List   Diagnosis Date Noted  . COPD with acute exacerbation ([place]) 09/30/2018  . Leg pain 05/27/2017  . COPD (chronic obstructive pulmonary disease) ([place]) 12/27/2016  . Atherosclerosis of native arteries of extremity with intermittent claudication ([place]) 09/20/2016  . Syncope 09/20/2016  . Bilateral carotid artery stenosis 07/30/2016  . Hyperlipidemia 06/19/2016  . PAD (peripheral artery disease) ([place]) 06/19/2016  . Community acquired pneumonia 02/14/2015  . Nicotine addiction 02/14/2015    Past Surgical History:  Procedure Laterality Date  . ABDOMINAL HYSTERECTOMY    . COLONOSCOPY WITH PROPOFOL N/A 02/21/2016   Procedure: COLONOSCOPY WITH PROPOFOL;  Surgeon: Lollie Sails, MD;  Location: [facility] ENDOSCOPY;  Service: Endoscopy;  Laterality: N/A;  . LOWER EXTREMITY ANGIOGRAPHY Right 05/28/2017   Procedure: Lower Extremity Angiography;   Surgeon: Katha Cabal, MD;  Location: [place] CV LAB;  Service: Cardiovascular;  Laterality: Right;  . MASTOIDECTOMY    . NECK SURGERY    . PERIPHERAL VASCULAR CATHETERIZATION Left 05/29/2016   Procedure: Lower Extremity Angiography;  Surgeon: Katha Cabal, MD;  Location: [place] CV LAB;  Service: Cardiovascular;  Laterality: Left;  . PERIPHERAL VASCULAR CATHETERIZATION  05/29/2016   Procedure: Lower Extremity Intervention;  Surgeon: Katha Cabal, MD;  Location: [place] CV LAB;  Service: Cardiovascular;;  . TONSILLECTOMY      Prior to Admission medications   Medication Sig Start Date End Date Taking? Authorizing Provider  aspirin 81 MG chewable tablet Chew by mouth daily.    [provider]  atorvastatin (LIPITOR) 40 MG tablet Take 40 mg by mouth daily.    [provider]  benzonatate (TESSALON) 200 MG capsule benzonatate 200 mg capsule 08/24/15   [provider]  clopidogrel (PLAVIX) 75 MG tablet TAKE 1 TABLET BY MOUTH EVERY DAY 09/18/19   Kris Hartmann, NP  COMBIVENT RESPIMAT 20-100 MCG/ACT AERS respimat Inhale 1 puff into the lungs 3 (three) times daily.  04/22/16   [provider]  cyclobenzaprine (FLEXERIL) 5 MG tablet Take 5 mg by mouth 2 (two) times daily.     [provider]  ibuprofen (ADVIL,MOTRIN) 200 MG tablet Take 200 mg by mouth every 6 (six) hours as needed for moderate pain.    [provider]  losartan (COZAAR) 25 MG tablet Take 25 mg by mouth daily.    [provider]  Donnal Debar 100-62.5-25 MCG/INH AEPB INHALE 1 INHALTION DAILY 06/02/18  [provider]    Allergies Patient has no known allergies.  Family History  Problem Relation Age of Onset  . COPD Mother   . Hypertension Mother   . COPD Father   . Breast cancer Maternal Grandmother 60  . Breast cancer Cousin 50       maternal side    Social History Social History   Tobacco Use  . Smoking status:  Former Smoker    Packs/day: 0.50    Years: 45.00    Pack years: 22.50    Types: Cigarettes  . Smokeless tobacco: Never Used  Vaping Use  . Vaping Use: Never used  Substance Use Topics  . Alcohol use: No  . Drug use: No    Review of Systems Unable to obtain secondary to AMS ____________________________________________   PHYSICAL EXAM:  VITAL SIGNS: ED Triage Vitals  Enc Vitals Group     BP      Pulse      Resp      Temp      Temp src      SpO2      Weight      Height      Head Circumference      Peak Flow      Pain Score      Pain Loc      Pain Edu?      Excl. in [place]?      Constitutional: Minimally responsive to painful stimuli.  Eyes: Conjunctivae are normal.  ENT      Head: Normocephalic and atraumatic.      Nose: No congestion/rhinnorhea.      Mouth/Throat: Mucous membranes are moist.      Neck: No stridor. Hematological/Lymphatic/Immunilogical: No cervical lymphadenopathy. Cardiovascular: Normal rate, regular rhythm.  No murmurs, rubs, or gallops.  Respiratory: Normal respiratory effort without tachypnea nor retractions. Breath sounds are clear and equal bilaterally. No wheezes/rales/rhonchi. Gastrointestinal: Soft and non tender. No rebound. No guarding.  Genitourinary: Deferred Musculoskeletal: Normal range of motion in all extremities. No lower extremity edema. Neurologic:  Minimally responsive to verbal stimuli. Gag reflex present. Skin:  Skin is warm, dry and intact. No rash noted. Psychiatric: Mood and affect are normal. Speech and behavior are normal. Patient exhibits appropriate insight and judgment.  ____________________________________________    LABS (pertinent positives/negatives)  UDS positive tricyclic CMP na 063, k 2.2, cl 69, cr 0.60 CBC wbc 6.2, hgb 13.1, plt 222 ____________________________________________   EKG  I, Nance Pear, attending physician, personally viewed and interpreted this EKG  EKG Time: 1900 Rate:  106 Rhythm: sinus tachycardia Axis: normal Intervals: qtc 492 QRS: narrow ST changes: no st elevation Impression: abnormal ekg   ____________________________________________    RADIOLOGY  CT head No acute abnormality  ____________________________________________   PROCEDURES  Procedures  CRITICAL CARE Performed by: Nance Pear   Total critical care time: 45 minutes  Critical care time was exclusive of separately billable procedures and treating other patients.  Critical care was necessary to treat or prevent imminent or life-threatening deterioration.  Critical care was time spent personally by me on the following activities: development of treatment plan with patient and/or surrogate as well as nursing, discussions with consultants, evaluation of patient's response to treatment, examination of patient, obtaining history from patient or surrogate, ordering and performing treatments and interventions, ordering and review of laboratory studies, ordering and review of radiographic studies, pulse oximetry and re-evaluation of patient's condition.  ____________________________________________   INITIAL IMPRESSION / ASSESSMENT AND PLAN /  ED COURSE  Pertinent labs & imaging results that were available during my care of the patient were reviewed by me and considered in my medical decision making (see chart for details).   Patient presented to the emergency department today because of concerns for altered mental status.  Per EMS she was found next to some unknown pills.  On initial exam patient was minimally responsive to painful stimuli.  Head CT was negative.  Blood work was concerning for low sodium and potassium levels.  I discussed with provider [place] with ICU who requested repeat. Repeat again showed low sodium and potassium levels. Patient was ordered iv fluids and potassium to start repletion. Here in the ER she did start becoming slightly more responsive to painful  stimuli, however still non verbal. Patient will be admitted to the ICU for further work up and management.  ____________________________________________   FINAL CLINICAL IMPRESSION(S) / ED DIAGNOSES  Final diagnoses:  Hyponatremia  Hypokalemia  Altered mental status, unspecified altered mental status type     Note: This dictation was prepared with Dragon dictation. Any transcriptional errors that result from this process are unintentional     Nance Pear, MD 03/30/20 [phone]

## 2020-03-30 NOTE — ED Triage Notes (Signed)
Pt arrives from home via [facility] with complain of unresponsive after potential overdose. Pt was found by husband to be unresponsive, irregular and intermittent respirations, satting 80% on RA.   EMS gave 4mg  narcan and BVM ventilation with improved oxygenation. Pt on arrival responsive to pain only, satting 94% RA, RR 14 spontaneous  Pt husband reports pt is taking prescription pain medication, but does not know which one. EMS reported finding a few unidentified pills beside pt, no bottle.

## 2020-03-31 ENCOUNTER — Inpatient Hospital Stay: Payer: No Typology Code available for payment source

## 2020-03-31 ENCOUNTER — Inpatient Hospital Stay
Admit: 2020-03-31 | Discharge: 2020-03-31 | Disposition: A | Discharge status: Home / Self Care | Payer: No Typology Code available for payment source | Attending: Pulmonary Disease | Admitting: Pulmonary Disease

## 2020-03-31 ENCOUNTER — Encounter: Payer: Self-pay | Admitting: Pulmonary Disease

## 2020-03-31 LAB — BASIC METABOLIC PANEL
Anion gap: 13 (ref 5–15)
Anion gap: 10 (ref 5–15)
Anion gap: 12 (ref 5–15)
Anion gap: 14 (ref 5–15)
Anion gap: 9 (ref 5–15)
Anion gap: 11 (ref 5–15)
BUN: <5 mg/dL — ABNORMAL LOW (ref 8–23)
BUN: <5 mg/dL — ABNORMAL LOW (ref 8–23)
BUN: 5 mg/dL — ABNORMAL LOW (ref 8–23)
BUN: 6 mg/dL — ABNORMAL LOW (ref 8–23)
BUN: 8 mg/dL (ref 8–23)
BUN: 9 mg/dL (ref 8–23)
CO2: 30 mmol/L (ref 22–32)
CO2: 31 mmol/L (ref 22–32)
CO2: 30 mmol/L (ref 22–32)
CO2: 28 mmol/L (ref 22–32)
CO2: 28 mmol/L (ref 22–32)
CO2: 30 mmol/L (ref 22–32)
Calcium: 7.8 mg/dL — ABNORMAL LOW (ref 8.9–10.3)
Calcium: 8.2 mg/dL — ABNORMAL LOW (ref 8.9–10.3)
Calcium: 8.6 mg/dL — ABNORMAL LOW (ref 8.9–10.3)
Calcium: 8.4 mg/dL — ABNORMAL LOW (ref 8.9–10.3)
Calcium: 8.6 mg/dL — ABNORMAL LOW (ref 8.9–10.3)
Calcium: 8.6 mg/dL — ABNORMAL LOW (ref 8.9–10.3)
Chloride: 77 mmol/L — ABNORMAL LOW (ref 98–111)
Chloride: 82 mmol/L — ABNORMAL LOW (ref 98–111)
Chloride: 83 mmol/L — ABNORMAL LOW (ref 98–111)
Chloride: 84 mmol/L — ABNORMAL LOW (ref 98–111)
Chloride: 90 mmol/L — ABNORMAL LOW (ref 98–111)
Chloride: 88 mmol/L — ABNORMAL LOW (ref 98–111)
Creatinine, Ser: 0.7 mg/dL (ref 0.44–1.00)
Creatinine, Ser: 0.71 mg/dL (ref 0.44–1.00)
Creatinine, Ser: 0.65 mg/dL (ref 0.44–1.00)
Creatinine, Ser: 0.67 mg/dL (ref 0.44–1.00)
Creatinine, Ser: 0.69 mg/dL (ref 0.44–1.00)
Creatinine, Ser: 0.75 mg/dL (ref 0.44–1.00)
GFR calc Af Amer: >60 mL/min (ref >60)
GFR calc Af Amer: >60 mL/min (ref >60)
GFR calc Af Amer: >60 mL/min (ref >60)
GFR calc Af Amer: >60 mL/min (ref >60)
GFR calc Af Amer: >60 mL/min (ref >60)
GFR calc Af Amer: >60 mL/min (ref >60)
GFR calc non Af Amer: >60 mL/min (ref >60)
GFR calc non Af Amer: >60 mL/min (ref >60)
GFR calc non Af Amer: >60 mL/min (ref >60)
GFR calc non Af Amer: >60 mL/min (ref >60)
GFR calc non Af Amer: >60 mL/min (ref >60)
GFR calc non Af Amer: >60 mL/min (ref >60)
Glucose, Bld: 107 mg/dL — ABNORMAL HIGH (ref 70–99)
Glucose, Bld: 123 mg/dL — ABNORMAL HIGH (ref 70–99)
Glucose, Bld: 148 mg/dL — ABNORMAL HIGH (ref 70–99)
Glucose, Bld: 148 mg/dL — ABNORMAL HIGH (ref 70–99)
Glucose, Bld: 147 mg/dL — ABNORMAL HIGH (ref 70–99)
Glucose, Bld: 141 mg/dL — ABNORMAL HIGH (ref 70–99)
Potassium: 2.9 mmol/L — ABNORMAL LOW (ref 3.5–5.1)
Potassium: 3.4 mmol/L — ABNORMAL LOW (ref 3.5–5.1)
Potassium: 4 mmol/L (ref 3.5–5.1)
Potassium: 4.1 mmol/L (ref 3.5–5.1)
Potassium: 4 mmol/L (ref 3.5–5.1)
Potassium: 4.2 mmol/L (ref 3.5–5.1)
Sodium: 120 mmol/L — ABNORMAL LOW (ref 135–145)
Sodium: 123 mmol/L — ABNORMAL LOW (ref 135–145)
Sodium: 125 mmol/L — ABNORMAL LOW (ref 135–145)
Sodium: 126 mmol/L — ABNORMAL LOW (ref 135–145)
Sodium: 127 mmol/L — ABNORMAL LOW (ref 135–145)
Sodium: 129 mmol/L — ABNORMAL LOW (ref 135–145)

## 2020-03-31 LAB — TSH: TSH: 0.545 u[IU]/mL (ref 0.350–4.500)

## 2020-03-31 LAB — BLOOD GAS, ARTERIAL
Acid-Base Excess: 8.4 mmol/L — ABNORMAL HIGH (ref 0.0–2.0)
Bicarbonate: 32.8 mmol/L — ABNORMAL HIGH (ref 20.0–28.0)
FIO2: 0.24
O2 Saturation: 94.9 %
Patient temperature: 37
pCO2 arterial: 43 mmHg (ref 32.0–48.0)
pH, Arterial: 7.49 — ABNORMAL HIGH (ref 7.350–7.450)
pO2, Arterial: 69 mmHg — ABNORMAL LOW (ref 83.0–108.0)

## 2020-03-31 LAB — MAGNESIUM
Magnesium: 2.5 mg/dL — ABNORMAL HIGH (ref 1.7–2.4)
Magnesium: 2.4 mg/dL (ref 1.7–2.4)
Magnesium: 2.3 mg/dL (ref 1.7–2.4)
Magnesium: 2.2 mg/dL (ref 1.7–2.4)
Magnesium: 2.2 mg/dL (ref 1.7–2.4)
Magnesium: 2.1 mg/dL (ref 1.7–2.4)

## 2020-03-31 LAB — HIV ANTIBODY (ROUTINE TESTING W REFLEX): HIV Screen 4th Generation wRfx: NONREACTIVE

## 2020-03-31 LAB — CBC
Hemoglobin: 13 g/dL (ref 12.0–15.0)
Platelets: 214 10*3/uL (ref 150–400)
WBC: 5.5 10*3/uL (ref 4.0–10.5)

## 2020-03-31 LAB — AMMONIA: Ammonia: 17 umol/L (ref 9–35)

## 2020-03-31 LAB — OSMOLALITY, URINE: Osmolality, Ur: 172 mOsm/kg — ABNORMAL LOW (ref 300–900)

## 2020-03-31 LAB — PROCALCITONIN: Procalcitonin: <0.1 ng/mL

## 2020-03-31 LAB — MRSA PCR SCREENING: MRSA by PCR: NEGATIVE

## 2020-03-31 LAB — GLUCOSE, CAPILLARY: Glucose-Capillary: 93 mg/dL (ref 70–99)

## 2020-03-31 LAB — SODIUM, URINE, RANDOM: Sodium, Ur: <10 mmol/L

## 2020-03-31 LAB — OSMOLALITY: Osmolality: 244 mOsm/kg — CL (ref 275–295)

## 2020-03-31 MED ORDER — IPRATROPIUM-ALBUTEROL 0.5-2.5 (3) MG/3ML IN SOLN
3.0000 mL | RESPIRATORY_TRACT | Status: DC
  Administered 2020-03-31 – 2020-04-02 (×13): 3 mL via RESPIRATORY_TRACT
  Filled 2020-03-31 (×13): qty 3

## 2020-03-31 MED ORDER — POTASSIUM CHLORIDE 10 MEQ/100ML IV SOLN
10.0000 meq | INTRAVENOUS | Status: AC
  Administered 2020-03-31 (×2): 10 meq via INTRAVENOUS
  Filled 2020-03-31 (×2): qty 100

## 2020-03-31 MED ORDER — BUDESONIDE 0.25 MG/2ML IN SUSP
0.2500 mg | Freq: Two times a day (BID) | RESPIRATORY_TRACT | Status: DC
  Administered 2020-04-01 – 2020-04-04 (×7): 0.25 mg via RESPIRATORY_TRACT
  Filled 2020-03-31 (×8): qty 2

## 2020-03-31 MED ORDER — ALBUTEROL SULFATE (2.5 MG/3ML) 0.083% IN NEBU
2.5000 mg | INHALATION_SOLUTION | RESPIRATORY_TRACT | Status: DC | PRN
  Administered 2020-04-01 – 2020-04-03 (×2): 2.5 mg via RESPIRATORY_TRACT
  Filled 2020-03-31 (×2): qty 3

## 2020-03-31 MED ORDER — POTASSIUM CHLORIDE 2 MEQ/ML IV SOLN
INTRAVENOUS | Status: DC
  Filled 2020-03-31: qty 1000

## 2020-03-31 MED ORDER — IOHEXOL 300 MG/ML  SOLN
75.0000 mL | Freq: Once | INTRAMUSCULAR | Status: AC | PRN
  Administered 2020-03-31: 75 mL via INTRAVENOUS

## 2020-03-31 NOTE — Evaluation (Signed)
Clinical/Bedside Swallow Evaluation Patient Details  Name: Wendy Ferrell MRN: [id number] Date of Birth: [birth year]  Today's Date: 03/31/2020 Time: SLP Start Time (ACUTE ONLY): 56 SLP Stop Time (ACUTE ONLY): 1320 SLP Time Calculation (min) (ACUTE ONLY): 60 min  Past Medical History:  Past Medical History:  Diagnosis Date  . COPD (chronic obstructive pulmonary disease) ([place])   . Hypertension   . Nicotine addiction   . Peripheral vascular disease [facility])    Past Surgical History:  Past Surgical History:  Procedure Laterality Date  . ABDOMINAL HYSTERECTOMY    . COLONOSCOPY WITH PROPOFOL N/A 02/21/2016   Procedure: COLONOSCOPY WITH PROPOFOL;  Surgeon: Lollie Sails, MD;  Location: [facility] ENDOSCOPY;  Service: Endoscopy;  Laterality: N/A;  . LOWER EXTREMITY ANGIOGRAPHY Right 05/28/2017   Procedure: Lower Extremity Angiography;  Surgeon: Katha Cabal, MD;  Location: [place] CV LAB;  Service: Cardiovascular;  Laterality: Right;  . MASTOIDECTOMY    . NECK SURGERY    . PERIPHERAL VASCULAR CATHETERIZATION Left 05/29/2016   Procedure: Lower Extremity Angiography;  Surgeon: Katha Cabal, MD;  Location: [place] CV LAB;  Service: Cardiovascular;  Laterality: Left;  . PERIPHERAL VASCULAR CATHETERIZATION  05/29/2016   Procedure: Lower Extremity Intervention;  Surgeon: Katha Cabal, MD;  Location: [place] CV LAB;  Service: Cardiovascular;;  . TONSILLECTOMY     HPI:  Pt is a 64 yo w/ h/o HTN, COPD, PVD who presented via the [facility] ED to the ICU for altered mental status. The patient was transported via EMS after having been called by the patient's husband. Upon squad arrival, they noted pt to be somnolent with two unidentified tablets nearby. Narcan was administered with little benefit. Oxygen was administered as her initial saturations were in the mid-80's. The patient is known to have COPD, there is some question as to her potential home oxygen need at baseline. There are  no benzos or narcotics prescribed to her, and a UDS was notable for tricyclics only, however she does take flexeril. Initial lab evaluation revealed a metabolic disarray with low sodium of 114 and hypokalemia. No diuretic medication was noted on her list and her ethanol level was <10. A presenting blood gas was not available.  CXR was without much acute finding.    Assessment / Plan / Recommendation Clinical Impression  This was a limited exam d/t pt's declined Cognitive status/presentation and reduced alertness and awareness for the task of oral intake and swallowing. Pt remained disengaged despite Max verbal/tactile stimulation and cues from both SLP and Daughter present at bedside. Daughter stated pt had been alert verbalizing she was "hungry" earlier; NSG reported giving single ice chips w/ good response from pt. Currently, pt exhibited no oral responses to attempted oral care w/ swabs nor to single ice chips presented; no lingual movements or attention to ice chips boluses given and no pharyngeal swallow appreciated. No further po trials given d/t risk for aspiration, choking. Of note, per Labs, pt's Sodium remains LOW at 126.  Recommend pt remain NPO d/t declined State and Cognitive status/presentation; frequent oral care for hygiene and stimulation of swallow; aspiration precautions. ST services will f/u tomorrow for ongoing assessment. The above was discussed w/ Dtr who agreed. NSG updated.  SLP Visit Diagnosis: Dysphagia, oropharyngeal phase (R13.12) (declined Cognitive status)    Aspiration Risk  Moderate aspiration risk;Risk for inadequate nutrition/hydration    Diet Recommendation  NPO at this time  Medication Administration: Via alternative means    Other  Recommendations Recommended  Consults:  (Dietician f/u) Oral Care Recommendations: Oral care QID;Staff/trained caregiver to provide oral care Other Recommendations:  (TBD)   Follow up Recommendations  (TBD)      Frequency and  Duration min 3x week  2 weeks       Prognosis Prognosis for Safe Diet Advancement: Guarded (currently) Barriers to Reach Goals: Cognitive deficits;Severity of deficits;Behavior Barriers/Prognosis Comment: low Na      Swallow Study   General Date of Onset: 03/30/20 HPI: Pt is a 64 yo w/ h/o HTN, COPD, PVD who presented via the [facility] ED to the ICU for altered mental status. The patient was transported via EMS after having been called by the patient's husband. Upon squad arrival, they noted pt to be somnolent with two unidentified tablets nearby. Narcan was administered with little benefit. Oxygen was administered as her initial saturations were in the mid-80's. The patient is known to have COPD, there is some question as to her potential home oxygen need at baseline. There are no benzos or narcotics prescribed to her, and a UDS was notable for tricyclics only, however she does take flexeril. Initial lab evaluation revealed a metabolic disarray with low sodium of 114 and hypokalemia. No diuretic medication was noted on her list and her ethanol level was <10. A presenting blood gas was not available.  CXR was without much acute finding.  Type of Study: Bedside Swallow Evaluation Previous Swallow Assessment: none Diet Prior to this Study: NPO Temperature Spikes Noted: No (wbc 5.5) Respiratory Status: Nasal cannula (1-4L) History of Recent Intubation: No Behavior/Cognition: Confused;Doesn't follow directions;Requires cueing (eyes closed after being awake earlier per Dtr) Oral Cavity Assessment:  (unable to assess d/t Cognition) Oral Care Completed by SLP: Yes (cursory) Oral Cavity - Dentition: Missing dentition Vision:  (n/a) Self-Feeding Abilities: Total assist Patient Positioning: Upright in bed (needed positioning) Baseline Vocal Quality:  (nonverbal) Volitional Cough: Cognitively unable to elicit Volitional Swallow: Unable to elicit    Oral/Motor/Sensory Function Overall Oral  Motor/Sensory Function:  (unable to assess d/t Cognition)   Ice Chips Ice chips: Impaired Presentation: Spoon (fed; 2 trials) Oral Phase Impairments: Poor awareness of bolus Oral Phase Functional Implications:  (no response) Pharyngeal Phase Impairments:  (no swallow appreciated)   Thin Liquid Thin Liquid: Not tested    Nectar Thick Nectar Thick Liquid: Not tested   Honey Thick Honey Thick Liquid: Not tested   Puree Puree: Not tested   Solid     Solid: Not tested        Orinda Kenner, MS, CCC-SLP Kendle Erker 03/31/2020,4:54 PM

## 2020-03-31 NOTE — Progress Notes (Signed)
PHARMACY CONSULT NOTE - FOLLOW UP  Pharmacy Consult for Electrolyte Monitoring and Replacement   Recent Labs: Potassium (mmol/L)  Date Value  03/31/2020 3.4 (L)   Magnesium (mg/dL)  Date Value  03/31/2020 2.4   Calcium (mg/dL)  Date Value  03/31/2020 8.2 (L)   Albumin (g/dL)  Date Value  03/30/2020 3.6   Sodium (mmol/L)  Date Value  03/31/2020 123 (L)   Assessment: Patient admitted to [place] from ED. NS Fluid bolus and potassium infusing on arrival.Patient admitted to [place] from ED. Report received from [place], [place]. NS Fluid bolus and potassium infusing on arrival.  Goal of Therapy:  [place] WNL, K+ >/= 4  Plan:  Pt has current order for Mag replacement and KCL replacement.  F/U labs in am and replenish per protocol.  0722 @ 0114 K+ = 2.9:  Will give KCL 12meq IV x 2 bags (73meq total) and f/u K+ w/ am labs (already ordered)  0722 @ 0507 K+ = 3.4:  Will give KCL 36meq IV x 2 bags (11meq total) and f/u K+ 1 hours after end of last dose.  Ena Dawley ,PharmD Clinical Pharmacist 03/31/2020 6:29 AM

## 2020-03-31 NOTE — Progress Notes (Signed)
Patient is more alert. Confused and disoriented to place and situation but easily reoriented. Potassium supplemented for 0100 labs, follow up labs ordered.

## 2020-03-31 NOTE — Progress Notes (Signed)
CRITICAL CARE PROGRESS NOTE    Name: MARITSSA HAUGHTON MRN: [id number] DOB: November 02, 1955     LOS: 1   SUBJECTIVE FINDINGS & SIGNIFICANT EVENTS    Patient description:  Tyson Masin is a 64 yo WF who presented via the [facility] ED to the ICU for altered mental status. The patient was transported via EMS after having been called by the patient's husband. Upon squad arrival, they noted Ms. Lasota to be somnolent with two unidentified tablets nearby. Narcan was administered with little benefit. Oxygen was administered as her initial saturations were in the mid-80's. The patient is known to have COPD, there is some question as to her potential home oxygen need at baseline. There are no benzos or narcotics prescribed to her, and a UDS was notable for tricyclics only, however she does take flexeril. Initial lab evaluation revealed a metabolic disarray with low sodium of 114 and hypokalemia. No diuretic medication was noted on her list and her ethanol level was <10. A presenting blood gas was not available, however a CXR was without much acute finding. She was wheezing and rhonchorous on her admission to the ICU  Lines/tubes : External Urinary Catheter (Active)  Collection Container Dedicated Suction Canister 03/31/20 0803  Securement Method Leg strap 03/31/20 0803  Site Assessment Intact;Clean 03/31/20 0803  Output (mL) 500 mL 03/31/20 0651    Microbiology/Sepsis markers: Results for orders placed or performed during the hospital encounter of 03/30/20  MRSA PCR Screening     Status: None   Collection Time: 03/30/20 11:31 PM   Specimen: Nasopharyngeal  Result Value Ref Range Status   MRSA by PCR NEGATIVE NEGATIVE Final    Comment:        The GeneXpert MRSA Assay (FDA approved for NASAL specimens only), is one component of  a comprehensive MRSA colonization surveillance program. It is not intended to diagnose MRSA infection nor to guide or monitor treatment for MRSA infections. Performed at [facility], [address] [place], [place] [address]     Anti-infectives:  Anti-infectives (From admission, onward)   Start     Dose/Rate Route Frequency Ordered Stop   03/31/20 0000  Ampicillin-Sulbactam (UNASYN) 3 g in sodium chloride 0.9 % 100 mL IVPB     Discontinue     3 g 200 mL/hr over 30 Minutes Intravenous Every 6 hours 03/30/20 2347          PAST MEDICAL HISTORY   Past Medical History:  Diagnosis Date  . COPD (chronic obstructive pulmonary disease) ([place])   . Hypertension   . Nicotine addiction   . Peripheral vascular disease ([place])      SURGICAL HISTORY   Past Surgical History:  Procedure Laterality Date  . ABDOMINAL HYSTERECTOMY    . COLONOSCOPY WITH PROPOFOL N/A 02/21/2016   Procedure: COLONOSCOPY WITH PROPOFOL;  Surgeon: Lollie Sails, MD;  Location: [facility] ENDOSCOPY;  Service: Endoscopy;  Laterality: N/A;  . LOWER EXTREMITY ANGIOGRAPHY Right 05/28/2017   Procedure: Lower Extremity Angiography;  Surgeon: Katha Cabal, MD;  Location: [facility] LAB;  Service: Cardiovascular;  Laterality: Right;  . MASTOIDECTOMY    . NECK SURGERY    . PERIPHERAL VASCULAR CATHETERIZATION Left 05/29/2016   Procedure: Lower Extremity Angiography;  Surgeon: Katha Cabal, MD;  Location: [facility];  Service: Cardiovascular;  Laterality: Left;  . PERIPHERAL VASCULAR CATHETERIZATION  05/29/2016   Procedure: Lower Extremity Intervention;  Surgeon: Katha Cabal, MD;  Location: [facility];  Service: Cardiovascular;;  . TONSILLECTOMY       FAMILY HISTORY   Family History  Problem Relation Age of Onset  . COPD Mother   . Hypertension Mother   . COPD Father   . Breast cancer Maternal Grandmother 60  . Breast cancer Cousin 50       maternal side      SOCIAL HISTORY   Social History   Tobacco Use  . Smoking status: Former Smoker    Packs/day: 0.50    Years: 45.00    Pack years: 22.50    Types: Cigarettes  . Smokeless tobacco: Never Used  Vaping Use  . Vaping Use: Never used  Substance Use Topics  . Alcohol use: No  . Drug use: No     MEDICATIONS   Current Medication:  Current Facility-Administered Medications:  .  albuterol (PROVENTIL) (2.5 MG/3ML) 0.083% nebulizer solution 2.5 mg, 2.5 mg, Nebulization, Q4H PRN, Mortimer Fries, Kurian, MD .  Ampicillin-Sulbactam (UNASYN) 3 g in sodium chloride 0.9 % 100 mL IVPB, 3 g, Intravenous, Q6H, Nelle Don, MD, Last Rate: 200 mL/hr at 03/31/20 0512, 3 g at 03/31/20 0512 .  Chlorhexidine Gluconate Cloth 2 % PADS 6 each, 6 each, Topical, Daily, Nelle Don, MD, 6 each at 03/30/20 2330 .  docusate sodium (COLACE) capsule 100 mg, 100 mg, Oral, BID PRN, Nelle Don, MD .  heparin injection 5,000 Units, 5,000 Units, Subcutaneous, Q8H, Nelle Don, MD, 5,000 Units at 03/31/20 [phone] .  ipratropium-albuterol (DUONEB) 0.5-2.5 (3) MG/3ML nebulizer solution 3 mL, 3 mL, Nebulization, Q4H, Kasa, Kurian, MD .  lactated ringers 1,000 mL with potassium chloride 20 mEq infusion, , Intravenous, Continuous, Nelle Don, MD, Last Rate: 50 mL/hr at 03/31/20 0651, New Bag at 03/31/20 0651 .  methylPREDNISolone sodium succinate (SOLU-MEDROL) 40 mg/mL injection 40 mg, 40 mg, Intravenous, Q8H, Nelle Don, MD, 40 mg at 03/31/20 0753 .  polyethylene glycol (MIRALAX / GLYCOLAX) packet 17 g, 17 g, Oral, Daily PRN, Nelle Don, MD    ALLERGIES   Patient has no known allergies.    REVIEW OF SYSTEMS   Unable to obtain due to altered mental status   PHYSICAL EXAMINATION   Vital Signs: Temp:  [97.9 F (36.6 C)-98.6 F (37 C)] 98.6 F (37 C) (07/22 0800) Pulse Rate:  [62-105] 97 (07/22 0803) Resp:  [14-28] 22 (07/22 0803) BP: (99-156)/(58-109) 137/99 (07/22 0800) SpO2:  [91  %-100 %] 100 % (07/22 0803) Weight:  [40.8 kg-44.4 kg] 44.4 kg (07/22 0424)  GENERAL:appears older then stated age HEAD: Normocephalic, atraumatic.  EYES: Pupils equal, round, reactive to light.  No scleral icterus.  MOUTH: Moist mucosal membrane. NECK: Supple. No thyromegaly. No nodules. No JVD.  PULMONARY: clear to auscultation  CARDIOVASCULAR: S1 and S2. Regular rate and rhythm. No murmurs, rubs, or gallops.  GASTROINTESTINAL: Soft, nontender, non-distended. No masses. Positive bowel sounds. No hepatosplenomegaly.  MUSCULOSKELETAL: No swelling, clubbing, or edema.  NEUROLOGIC: Mild distress due to acute illness, GCS10 SKIN:intact,warm,dry   PERTINENT DATA     Infusions: . ampicillin-sulbactam (UNASYN) IV 3 g (03/31/20 0512)  . lactated ringers with kcl 50 mL/hr at 03/31/20 7782   Scheduled Medications: . Chlorhexidine Gluconate Cloth  6 each Topical Daily  . heparin  5,000 Units Subcutaneous Q8H  . ipratropium-albuterol  3 mL Nebulization Q4H  . methylPREDNISolone (SOLU-MEDROL) injection  40 mg Intravenous Q8H   PRN Medications: albuterol, docusate sodium, polyethylene glycol Hemodynamic parameters:   Intake/Output: 07/21 0701 - 07/22 0700 In: 917.5 [I.V.:344.2; IV  Piggyback:573.3] Out: 500 [Urine:500]  Ventilator  Settings:     LAB RESULTS:  Basic Metabolic Panel: Recent Labs  Lab 03/30/20 1925 03/30/20 1925 03/30/20 2100 03/30/20 2100 03/31/20 0114 03/31/20 0114 03/31/20 0507 03/31/20 0913  NA 114*  --  116*  --  120*  --  123* 125*  K 2.2*   < > 2.3*   < > 2.9*   < > 3.4* 4.0  CL 69*  --  69*  --  77*  --  82* 83*  CO2 33*  --  35*  --  30  --  31 30  GLUCOSE 92  --  93  --  107*  --  123* 148*  BUN <5*  --  <5*  --  <5*  --  <5* 5*  CREATININE 0.60  --  0.67  --  0.70  --  0.71 0.65  CALCIUM 7.9*  --  8.3*  --  7.8*  --  8.2* 8.6*  MG  --   --  1.9  --  2.5*  --  2.4 2.3   < > = values in this interval not displayed.   Liver Function  Tests: Recent Labs  Lab 03/30/20 1925  AST 28  ALT 16  ALKPHOS 63  BILITOT 1.3*  PROT 5.8*  ALBUMIN 3.6   No results for input(s): LIPASE, AMYLASE in the last 168 hours. Recent Labs  Lab 03/31/20 0114  AMMONIA 17   CBC: Recent Labs  Lab 03/30/20 1925 03/30/20 2110 03/31/20 0114  WBC 6.2 6.2 5.5  NEUTROABS 3.9 3.7  --   HGB RESULTS UNAVAILABLE DUE TO INTERFERING SUBSTANCE 13.1 13.0  HCT RESULTS UNAVAILABLE DUE TO INTERFERING SUBSTANCE RESULTS UNAVAILABLE DUE TO INTERFERING SUBSTANCE RESULTS UNAVAILABLE DUE TO INTERFERING SUBSTANCE  MCV RESULTS UNAVAILABLE DUE TO INTERFERING SUBSTANCE RESULTS UNAVAILABLE DUE TO INTERFERING SUBSTANCE RESULTS UNAVAILABLE DUE TO INTERFERING SUBSTANCE  PLT 230 222 214   Cardiac Enzymes: No results for input(s): CKTOTAL, CKMB, CKMBINDEX, TROPONINI in the last 168 hours. BNP: Invalid input(s): POCBNP CBG: No results for input(s): GLUCAP in the last 168 hours.     IMAGING RESULTS:  Imaging: CT Head Wo Contrast  Result Date: 03/30/2020 CLINICAL DATA:  Unresponsive possible overdose EXAM: CT HEAD WITHOUT CONTRAST TECHNIQUE: Contiguous axial images were obtained from the base of the skull through the vertex without intravenous contrast. COMPARISON:  MRI 08/09/2016, CT brain 08/09/2016 FINDINGS: Brain: No evidence of acute infarction, hemorrhage, hydrocephalus, extra-axial collection or mass lesion/mass effect. Vascular: No hyperdense vessel.  Carotid vascular calcification. Skull: Normal. Negative for fracture or focal lesion. Sinuses/Orbits: No acute finding. Other: None IMPRESSION: Negative non contrasted CT appearance of the brain. Electronically Signed   By: Donavan Foil M.D.   On: 03/30/2020 20:07   DG Chest Portable 1 View  Result Date: 03/30/2020 CLINICAL DATA:  Altered mental status EXAM: PORTABLE CHEST 1 VIEW COMPARISON:  09/30/2018, 08/09/2016 FINDINGS: Hyperinflation with probable emphysematous disease. Stable cardiomediastinal  silhouette. Mild asymmetric density at the right apex. No pneumothorax. IMPRESSION: 1. Hyperinflation with probable emphysematous disease. 2. Slight asymmetric opacity at the right lung apex. Chest CT may be considered for further evaluation to exclude airspace disease or mass at the right apex. Electronically Signed   By: Donavan Foil M.D.   On: 03/30/2020 22:14   @PROBHOSP @ CT Head Wo Contrast  Result Date: 03/30/2020 CLINICAL DATA:  Unresponsive possible overdose EXAM: CT HEAD WITHOUT CONTRAST TECHNIQUE: Contiguous axial images were obtained from the base of the  skull through the vertex without intravenous contrast. COMPARISON:  MRI 08/09/2016, CT brain 08/09/2016 FINDINGS: Brain: No evidence of acute infarction, hemorrhage, hydrocephalus, extra-axial collection or mass lesion/mass effect. Vascular: No hyperdense vessel.  Carotid vascular calcification. Skull: Normal. Negative for fracture or focal lesion. Sinuses/Orbits: No acute finding. Other: None IMPRESSION: Negative non contrasted CT appearance of the brain. Electronically Signed   By: Donavan Foil M.D.   On: 03/30/2020 20:07   DG Chest Portable 1 View  Result Date: 03/30/2020 CLINICAL DATA:  Altered mental status EXAM: PORTABLE CHEST 1 VIEW COMPARISON:  09/30/2018, 08/09/2016 FINDINGS: Hyperinflation with probable emphysematous disease. Stable cardiomediastinal silhouette. Mild asymmetric density at the right apex. No pneumothorax. IMPRESSION: 1. Hyperinflation with probable emphysematous disease. 2. Slight asymmetric opacity at the right lung apex. Chest CT may be considered for further evaluation to exclude airspace disease or mass at the right apex. Electronically Signed   By: Donavan Foil M.D.   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  - TSH-wnl  - Tylenol, ETOH, ASA all normal - no hx of CHF or liver dz  - UA suggestive of absence of infection  -monitor Na q4h   -blood cultures ordered - patient placed on unasyn empirically   -TCA is elevated on UDS   Advanced centrilobular emphysema with COPD  typical COPD carepath  -will d/c steroids as there are no signs of AECOPD at this time - will obtain procalcitonin to guide antimicrobials - MRSA pcr  - RVP for viral LRTI rule out   ID -continue IV abx as prescibed -follow up cultures  GI/Nutrition GI PROPHYLAXIS as indicated DIET-->TF's as tolerated Constipation protocol as indicated  ENDO - ICU hypoglycemic\Hyperglycemia protocol -check FSBS per protocol   ELECTROLYTES -follow labs as needed -replace as needed -pharmacy consultation   DVT/GI PRX ordered -SCDs  TRANSFUSIONS AS NEEDED MONITOR FSBS ASSESS the need for LABS as needed   Critical care provider statement:    Critical care time (minutes):  109   Critical care time was exclusive of:  Separately billable procedures and treating other patients   Critical care was necessary to treat or prevent imminent or life-threatening deterioration of the following conditions:  Altered mental status with lethargy, severe hyponatremia, COPD with emphysema, multiple comorbid conditions   Critical care was time spent personally by me on the following activities:  Development of treatment plan with patient or surrogate, discussions with consultants, evaluation of patient's response to treatment, examination of patient, obtaining history from patient or surrogate, ordering and performing treatments and interventions, ordering and review of laboratory  studies and re-evaluation of patient's condition.  I assumed direction of critical care for this patient from another provider in my specialty: no    This document was prepared using Dragon voice recognition software and may include unintentional dictation errors.    Ottie Glazier, M.D.  Division of Pulmonary & [place]  [facility] [place]

## 2020-03-31 NOTE — Progress Notes (Signed)
Pt to MRI at 2100  Return from MRI to ICU 12 at 2200

## 2020-03-31 NOTE — Progress Notes (Signed)
PHARMACY CONSULT NOTE - FOLLOW UP  Pharmacy Consult for Electrolyte Monitoring and Replacement   Recent Labs: Potassium (mmol/L)  Date Value  03/30/2020 2.3 (LL)   Magnesium (mg/dL)  Date Value  03/30/2020 1.9   Calcium (mg/dL)  Date Value  03/30/2020 8.3 (L)   Albumin (g/dL)  Date Value  03/30/2020 3.6   Sodium (mmol/L)  Date Value  03/30/2020 116 (LL)   Assessment: Patient admitted to [facility] from ED. NS Fluid bolus and potassium infusing on arrival.Patient admitted to [facility] from ED. Report received from [place], [place]. NS Fluid bolus and potassium infusing on arrival.  Goal of Therapy:  Lytes WNL  Plan:  Pt has current order for Mag replacement and KCL replacement.  F/U labs in am and replenish per protocol.  Ena Dawley ,PharmD Clinical Pharmacist 03/31/2020 12:25 AM

## 2020-03-31 NOTE — Progress Notes (Addendum)
Late note. After ample time for an afternoon nap patient will not open eyes, withdraw extremities from pain or react to sternal rub. Daughters say she talks and carries on a conversation with them but does not move or open eyes. Earlier she would not open eyes or cooperate with the speech therapist for a swallow evaluation. This is a total change from earlier today. She was confused but had her eyes open talking and following commands .  Dr. Lanney Gins consulted. ABG ordered. CO2 was normal but O2 was low. CT of chest ordered and in progress. MRI of brain was also ordered to rule out stroke. Earlier CT of head was negative on admission.

## 2020-03-31 NOTE — Progress Notes (Signed)
PHARMACY CONSULT NOTE - FOLLOW UP  Pharmacy Consult for Electrolyte Monitoring and Replacement   Recent Labs: Potassium (mmol/L)  Date Value  03/31/2020 2.9 (L)   Magnesium (mg/dL)  Date Value  03/31/2020 2.5 (H)   Calcium (mg/dL)  Date Value  03/31/2020 7.8 (L)   Albumin (g/dL)  Date Value  03/30/2020 3.6   Sodium (mmol/L)  Date Value  03/31/2020 120 (L)   Assessment: Patient admitted to [facility] from ED. NS Fluid bolus and potassium infusing on arrival.Patient admitted to [facility] from ED. Report received from [place], [place]. NS Fluid bolus and potassium infusing on arrival.  Goal of Therapy:  Lytes WNL  Plan:  Pt has current order for Mag replacement and KCL replacement.  F/U labs in am and replenish per protocol.  0722 @ 0114 K+ = 2.9:  Will give KCL 15meq IV x 2 bags (50meq total) and f/u K+ w/ am labs (already ordered)  Ena Dawley ,PharmD Clinical Pharmacist 03/31/2020 2:29 AM

## 2020-03-31 NOTE — Progress Notes (Signed)
PHARMACY CONSULT NOTE - FOLLOW UP  Pharmacy Consult for Electrolyte Monitoring and Replacement   Recent Labs: Potassium (mmol/L)  Date Value  03/31/2020 4.1   Magnesium (mg/dL)  Date Value  03/31/2020 2.2   Calcium (mg/dL)  Date Value  03/31/2020 8.4 (L)   Albumin (g/dL)  Date Value  03/30/2020 3.6   Sodium (mmol/L)  Date Value  03/31/2020 126 (L)   Assessment: Patient admitted to ICU with hyponatremia. Initially started on hypertonic saline which was then changed to NS and then LR due to rapid correction of sodium. Currently NPO in setting of AMS.  Goal of Therapy:  Lytes WNL, K+ >/= 4  Plan:  Potassium improved. Sodium has trended up significantly, now appears to have leveled out. Discussed with provider. Patient does not appear to be dehydrated; will discontinue fluids and continue to monitor. Can restart fluids if necessary. Will replace potassium with IV potassium runs if drops again. Will continue to follow BMP q4h for now.   Tawnya Crook ,PharmD Clinical Pharmacist 03/31/2020 2:45 PM

## 2020-04-01 DIAGNOSIS — R4182 Altered mental status, unspecified: Secondary | ICD-10-CM

## 2020-04-01 DIAGNOSIS — R569 Unspecified convulsions: Secondary | ICD-10-CM | POA: Diagnosis not present

## 2020-04-01 LAB — BASIC METABOLIC PANEL
Anion gap: 19 — ABNORMAL HIGH (ref 5–15)
Anion gap: 11 (ref 5–15)
BUN: 11 mg/dL (ref 8–23)
BUN: 13 mg/dL (ref 8–23)
CO2: 27 mmol/L (ref 22–32)
CO2: 32 mmol/L (ref 22–32)
Calcium: 9 mg/dL (ref 8.9–10.3)
Calcium: 8.5 mg/dL — ABNORMAL LOW (ref 8.9–10.3)
Chloride: 88 mmol/L — ABNORMAL LOW (ref 98–111)
Chloride: 89 mmol/L — ABNORMAL LOW (ref 98–111)
Creatinine, Ser: 0.95 mg/dL (ref 0.44–1.00)
Creatinine, Ser: 0.78 mg/dL (ref 0.44–1.00)
GFR calc Af Amer: >60 mL/min (ref >60)
GFR calc Af Amer: >60 mL/min (ref >60)
GFR calc non Af Amer: >60 mL/min (ref >60)
GFR calc non Af Amer: >60 mL/min (ref >60)
Glucose, Bld: 151 mg/dL — ABNORMAL HIGH (ref 70–99)
Glucose, Bld: 132 mg/dL — ABNORMAL HIGH (ref 70–99)
Potassium: 4.4 mmol/L (ref 3.5–5.1)
Potassium: 4.5 mmol/L (ref 3.5–5.1)
Sodium: 134 mmol/L — ABNORMAL LOW (ref 135–145)
Sodium: 132 mmol/L — ABNORMAL LOW (ref 135–145)

## 2020-04-01 LAB — ECHOCARDIOGRAM COMPLETE
Area-P 1/2: 6.83 cm2
Height: 64 in
S' Lateral: 3.02 cm
Weight: 1590.84 oz

## 2020-04-01 MED ORDER — LACTATED RINGERS IV BOLUS
500.0000 mL | Freq: Once | INTRAVENOUS | Status: AC
  Administered 2020-04-01: 500 mL via INTRAVENOUS

## 2020-04-01 MED ORDER — METHYLPREDNISOLONE SODIUM SUCC 40 MG IJ SOLR
40.0000 mg | Freq: Three times a day (TID) | INTRAMUSCULAR | Status: DC
  Administered 2020-04-01 – 2020-04-04 (×10): 40 mg via INTRAVENOUS
  Filled 2020-04-01 (×10): qty 1

## 2020-04-01 MED ORDER — HYDROMORPHONE HCL 1 MG/ML IJ SOLN
0.2500 mg | Freq: Once | INTRAMUSCULAR | Status: AC
  Administered 2020-04-01: 0.25 mg via INTRAVENOUS
  Filled 2020-04-01: qty 1

## 2020-04-01 MED ORDER — METOPROLOL TARTRATE 5 MG/5ML IV SOLN
2.5000 mg | Freq: Once | INTRAVENOUS | Status: AC
  Administered 2020-04-01: 2.5 mg via INTRAVENOUS
  Filled 2020-04-01: qty 5

## 2020-04-01 NOTE — Progress Notes (Signed)
Patient is not responsive to voice.  RT delivered nebulizer tx without patient awakening.  Night shift RN reported patient hysteria after patient awoke for therapy.  Lung sounds are rhodochrous with inspiratory wheezing.  Heart sound S1S2.  No signs of edema.  Will continue to monitor.

## 2020-04-01 NOTE — Consult Note (Addendum)
Requesting Physician: Dr. Laural Roes    Chief Complaint:  Comatose,   History obtained from: Patient and Chart    HPI:                                                                                                                                       Wendy Ferrell is a 64 y.o. female with past medical history significant for COPD, hypertension presents to emergency department  On 7/21after unresponsive with bottle of pills next to her.  Patient noted to be hyponatremic with sodium 114 and there was concern for TCA/Flexeril overdose.  Per patient's daughter's boyfriend-patient takes Flexeril.  UDS notable for elevated levels of TCA.  Patient was admitted to ICU received hypoglycemia for correction of hyponatremia.  Consulted for persistent encephalopathy/comatose state despite treatment of hyponatremia.  Per daughter, patient became more alert the following morning on 7/22 and even answer some questions.  However around 3 PM she became more lethargic, and today patient is obtunded and not following any commands, nonverbal.  MRI brain was performed showed no acute findings. Neurology consulted for further recommendations.      Past Medical History:  Diagnosis Date  . COPD (chronic obstructive pulmonary disease) ([place])   . Hypertension   . Nicotine addiction   . Peripheral vascular disease [facility])     Past Surgical History:  Procedure Laterality Date  . ABDOMINAL HYSTERECTOMY    . COLONOSCOPY WITH PROPOFOL N/A 02/21/2016   Procedure: COLONOSCOPY WITH PROPOFOL;  Surgeon: Lollie Sails, MD;  Location: [facility] ENDOSCOPY;  Service: Endoscopy;  Laterality: N/A;  . LOWER EXTREMITY ANGIOGRAPHY Right 05/28/2017   Procedure: Lower Extremity Angiography;  Surgeon: Katha Cabal, MD;  Location: [place] CV LAB;  Service: Cardiovascular;  Laterality: Right;  . MASTOIDECTOMY    . NECK SURGERY    . PERIPHERAL VASCULAR CATHETERIZATION Left 05/29/2016   Procedure: Lower Extremity Angiography;   Surgeon: Katha Cabal, MD;  Location: [place] CV LAB;  Service: Cardiovascular;  Laterality: Left;  . PERIPHERAL VASCULAR CATHETERIZATION  05/29/2016   Procedure: Lower Extremity Intervention;  Surgeon: Katha Cabal, MD;  Location: [place] CV LAB;  Service: Cardiovascular;;  . TONSILLECTOMY      Family History  Problem Relation Age of Onset  . COPD Mother   . Hypertension Mother   . COPD Father   . Breast cancer Maternal Grandmother 60  . Breast cancer Cousin 50       maternal side   Social History:  reports that she has quit smoking. Her smoking use included cigarettes. She has a 22.50 pack-year smoking history. She has never used smokeless tobacco. She reports that she does not drink alcohol and does not use drugs.  Allergies: No Known Allergies  Medications:  I reviewed home medications  ROS:                                                                                                                                     Unable to review to be due to patient's mental status   Examination:                                                                                                      General: Appears well-developed and well-nourished.  Psych: Affect appropriate to situation Eyes: No scleral injection HENT: No OP obstrucion Head: Normocephalic.  Cardiovascular: Normal rate and regular rhythm.  Respiratory: Effort normal and breath sounds normal to anterior ascultation GI: Soft.  No distension. There is no tenderness.  Skin: WDI    Neurological Examination Mental Status: Obtunded, forcibly closes eyes.  When open does blink to threat.  Nonverbal and does not follow any commands Cranial Nerves: II: Visual fields: Unable to assess as patient not cooperative III,IV, VI: ptosis not present, extra-ocular motions intact bilaterally,  pupils equal, round, reactive to light and accommodation V,VII: Face appears symmetric IX/X: Patient does cough XII: midline tongue extension Motor: Withdraws in bilateral upper extremities, at times sustained antigravity in upper extremity.  Withdraws in bilateral lower extremities against gravity as well Tone and bulk:normal tone throughout; no atrophy noted Sensory: Unable to assess Deep Tendon Reflexes: 2+ and symmetric throughout, no clonus Plantars: Right: downgoing   Left: downgoing Cerebellar: Unable to assess      Lab Results: Basic Metabolic Panel: Recent Labs  Lab 03/31/20 0507 03/31/20 0507 03/31/20 0913 03/31/20 0913 03/31/20 1245 03/31/20 1245 03/31/20 1713 03/31/20 1713 03/31/20 2208 04/01/20 0349 04/01/20 0947  NA 123*   < > 125*   < > 126*  --  127*  --  129* 134* 132*  K 3.4*   < > 4.0   < > 4.1  --  4.0  --  4.2 4.4 4.5  CL 82*   < > 83*   < > 84*  --  90*  --  88* 88* 89*  CO2 31   < > 30   < > 28  --  28  --  30 27 32  GLUCOSE 123*   < > 148*   < > 148*  --  147*  --  141* 151* 132*  BUN <5*   < > 5*   < > 6*  --  8  --  9 11 13  CREATININE 0.71   < > 0.65   < > 0.67  --  0.69  --  0.75 0.95 0.78  CALCIUM 8.2*   < > 8.6*   < > 8.4*   < > 8.6*   < > 8.6* 9.0 8.5*  MG 2.4  --  2.3  --  2.2  --  2.2  --  2.1  --   --    < > = values in this interval not displayed.    CBC: Recent Labs  Lab 03/30/20 1925 03/30/20 2110 03/31/20 0114  WBC 6.2 6.2 5.5  NEUTROABS 3.9 3.7  --   HGB RESULTS UNAVAILABLE DUE TO INTERFERING SUBSTANCE 13.1 13.0  HCT RESULTS UNAVAILABLE DUE TO INTERFERING SUBSTANCE RESULTS UNAVAILABLE DUE TO INTERFERING SUBSTANCE RESULTS UNAVAILABLE DUE TO INTERFERING SUBSTANCE  MCV RESULTS UNAVAILABLE DUE TO INTERFERING SUBSTANCE RESULTS UNAVAILABLE DUE TO INTERFERING SUBSTANCE RESULTS UNAVAILABLE DUE TO INTERFERING SUBSTANCE  PLT 230 222 214    Coagulation Studies: No results for input(s): LABPROT, INR in the last 72  hours.  Imaging: CT Head Wo Contrast  Result Date: 03/30/2020 CLINICAL DATA:  Unresponsive possible overdose EXAM: CT HEAD WITHOUT CONTRAST TECHNIQUE: Contiguous axial images were obtained from the base of the skull through the vertex without intravenous contrast. COMPARISON:  MRI 08/09/2016, CT brain 08/09/2016 FINDINGS: Brain: No evidence of acute infarction, hemorrhage, hydrocephalus, extra-axial collection or mass lesion/mass effect. Vascular: No hyperdense vessel.  Carotid vascular calcification. Skull: Normal. Negative for fracture or focal lesion. Sinuses/Orbits: No acute finding. Other: None IMPRESSION: Negative non contrasted CT appearance of the brain. Electronically Signed   By: Donavan Foil M.D.   On: 03/30/2020 20:07   CT CHEST W CONTRAST  Result Date: 03/31/2020 CLINICAL DATA:  Cancer of unknown primary, altered mental status EXAM: CT CHEST WITH CONTRAST TECHNIQUE: Multidetector CT imaging of the chest was performed during intravenous contrast administration. CONTRAST:  20mL OMNIPAQUE IOHEXOL 300 MG/ML  SOLN COMPARISON:  None. FINDINGS: Cardiovascular: Moderate coronary artery calcification. Global cardiac size within normal limits. No pericardial effusion. The central pulmonary arteries are enlarged in keeping with changes of pulmonary arterial hypertension. Atherosclerotic calcification is noted within the thoracic aorta and proximal arch vasculature. The thoracic aorta is of normal caliber. Mediastinum/Nodes: There is effacement of the fat planes within the soft tissues of the lower neck, not well characterized on this examination, but possibly the sequela of radiation therapy the appropriate clinical setting. No pathologic thoracic adenopathy. Esophagus unremarkable. Thyroid unremarkable. Lungs/Pleura: There is moderate to severe centrilobular emphysema present. Moderate, symmetric pulmonary hyperinflation is noted. There is debris within the right lower lobe bronchus extending into the  segmental bronchi of the dependent segments of the right lower lobe likely representing a mucous plug or potentially aspirated foreign material. There is moderate bronchial wall thickening noted in keeping with chronic airway inflammation. No pneumothorax or pleural effusion. Upper Abdomen: Unremarkable Musculoskeletal: A T3 left hemivertebra is noted resulting in marked focal levoscoliosis of the a thoracic spine at this level. No acute bone abnormality. IMPRESSION: Moderate to severe centrilobular emphysema. Chronic airway inflammation. Occlusive debris within the right lower lobe bronchi possibly representing a mucous plug or aspirated foreign material. Moderate coronary artery calcification. Morphologic changes in keeping with pulmonary arterial hypertension. Aortic Atherosclerosis (ICD10-I70.0) and Emphysema (ICD10-J43.9). Electronically Signed   By: Fidela Salisbury MD   On: 03/31/2020 19:23   MR BRAIN WO CONTRAST  Result Date: 03/31/2020 CLINICAL DATA:  Encephalopathy EXAM: MRI HEAD WITHOUT CONTRAST TECHNIQUE: Multiplanar, multiecho  pulse sequences of the brain and surrounding structures were obtained without intravenous contrast. COMPARISON:  08/09/2016 brain MRI FINDINGS: Brain: No acute infarct, acute hemorrhage or extra-axial collection. Normal white matter signal. Normal volume of CSF spaces. No chronic microhemorrhage. Normal midline structures. Vascular: Normal flow voids. Skull and upper cervical spine: Normal marrow signal. Sinuses/Orbits: Bilateral mastoid fluid. Status post bilateral ocular lens replacement. Other: None IMPRESSION: 1. Normal MRI of the brain. 2. Bilateral mastoid fluid. Electronically Signed   By: Ulyses Jarred M.D.   On: 03/31/2020 22:15   DG Chest Portable 1 View  Result Date: 03/30/2020 CLINICAL DATA:  Altered mental status EXAM: PORTABLE CHEST 1 VIEW COMPARISON:  09/30/2018, 08/09/2016 FINDINGS: Hyperinflation with probable emphysematous disease. Stable cardiomediastinal  silhouette. Mild asymmetric density at the right apex. No pneumothorax. IMPRESSION: 1. Hyperinflation with probable emphysematous disease. 2. Slight asymmetric opacity at the right lung apex. Chest CT may be considered for further evaluation to exclude airspace disease or mass at the right apex. Electronically Signed   By: Donavan Foil M.D.   On: 03/30/2020 22:14   ECHOCARDIOGRAM COMPLETE  Result Date: 04/01/2020    ECHOCARDIOGRAM REPORT   Patient Name:   Wendy Ferrell Date of Exam: 03/31/2020 Medical Rec #:  [id number]      Height:       64.0 in Accession #:    [id number]     Weight:       97.9 lb Date of Birth:  03/08/1956       BSA:          1.444 m Patient Age:    37 years       BP:           156/91 mmHg Patient Gender: F              HR:           110 bpm. Exam Location:  [facility] Procedure: 2D Echo Indications:     CHF 428.21/I50.21  History:         Patient has no prior history of Echocardiogram examinations.                  Risk Factors:Current Smoker and Dyslipidemia.  Sonographer:     Avanell Shackleton Referring Phys:  [id number] ALESKEROV Diagnosing Phys: Bartholome Bill MD IMPRESSIONS  1. Left ventricular ejection fraction, by estimation, is 50 to 55%. The left ventricle has low normal function. Left ventricular endocardial border not optimally defined to evaluate regional wall motion. Left ventricular diastolic parameters were normal.  2. Right ventricular systolic function is normal. The right ventricular size is mildly enlarged.  3. Right atrial size was mildly dilated.  4. The mitral valve was not well visualized. Trivial mitral valve regurgitation.  5. The aortic valve is grossly normal. Aortic valve regurgitation is not visualized. FINDINGS  Left Ventricle: Left ventricular ejection fraction, by estimation, is 50 to 55%. The left ventricle has low normal function. Left ventricular endocardial border not optimally defined to evaluate regional wall motion. The left ventricular internal cavity  size was  normal in size. There is borderline left ventricular hypertrophy. Left ventricular diastolic parameters were normal. Right Ventricle: The right ventricular size is mildly enlarged. No increase in right ventricular wall thickness. Right ventricular systolic function is normal. Left Atrium: Left atrial size was normal in size. Right Atrium: Right atrial size was mildly dilated. Pericardium: There is no evidence of pericardial effusion. Mitral Valve: The mitral valve was not well  visualized. Trivial mitral valve regurgitation. Tricuspid Valve: The tricuspid valve is not well visualized. Tricuspid valve regurgitation is mild. Aortic Valve: The aortic valve is grossly normal. Aortic valve regurgitation is not visualized. Pulmonic Valve: The pulmonic valve was not well visualized. Pulmonic valve regurgitation is trivial. Aorta: The aortic root was not well visualized. IAS/Shunts: The interatrial septum was not assessed.  LEFT VENTRICLE PLAX 2D LVIDd:         3.64 cm LVIDs:         3.02 cm LV PW:         0.69 cm LV IVS:        0.63 cm  IVC IVC diam: 1.46 cm LEFT ATRIUM           Index       RIGHT ATRIUM           Index LA diam:      3.00 cm 2.08 cm/m  RA Area:     10.50 cm LA Vol (A4C): 74.9 ml 51.87 ml/m RA Volume:   20.90 ml  14.47 ml/m   AORTA Ao Root diam: 3.10 cm MITRAL VALVE MV Area (PHT): 6.83 cm MV Decel Time: 111 msec MV E velocity: 110.00 cm/s MV A velocity: 92.70 cm/s MV E/A ratio:  1.19 Bartholome Bill MD Electronically signed by Bartholome Bill MD Signature Date/Time: 04/01/2020/8:40:31 AM    Final      I have reviewed the above imaging : Reviewed MRI brain appears normal.  No evidence of acute infarct   ASSESSMENT AND PLAN  63 year old female presents with hyponatremia secondary to SIADH, concerns for Flexeril overdose. Remains obtunded despite correction of sodium neurology consulted.  Low suspicion for osmotic pontine demyelination as patient does withdraw briskly to noxious stimulus, and no changes  on MRI brain.  On exam, patient does forcefully close her eyes, appears to be awake.  EEG was obtained which did not show any epileptiform discharges.  Patient not febrile, no increased tone or hyperreflexia, no tachycardia -unlikely to be serotonin syndrome.   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    Recommendations Consider psychiatric consult Continue supportive measures Maintains sodium in the 130 -135 range, avoid rapid fluctuations Frequent neurochecks every 4 hours   Maxcine Strong [facility] Pager Number [phone]

## 2020-04-01 NOTE — Progress Notes (Signed)
CRITICAL CARE PROGRESS NOTE    Name: Wendy Ferrell MRN: [id number] DOB: [birth year]     LOS: 2   SUBJECTIVE FINDINGS & SIGNIFICANT EVENTS    Patient description:  Wendy Ferrell is a 64 yo WF who presented via the [facility] ED to the ICU for altered mental status. The patient was transported via EMS after having been called by the patient's husband. Upon squad arrival, they noted Ms. Wendy Ferrell to be somnolent with two unidentified tablets nearby. Narcan was administered with little benefit. Oxygen was administered as her initial saturations were in the mid-80's. The patient is known to have COPD, there is some question as to her potential home oxygen need at baseline. There are no benzos or narcotics prescribed to her, and a UDS was notable for tricyclics only, however she does take flexeril. Initial lab evaluation revealed a metabolic disarray with low sodium of 114 and hypokalemia. No diuretic medication was noted on her list and her ethanol level was <10. A presenting blood gas was not available, however a CXR was without much acute finding. She was wheezing and rhonchorous on her admission to the ICU   04/01/20- Discussed hospital course with neurologist today, there is plan for EEG and additional ancillary testing.   Lines/tubes : External Urinary Catheter (Active)  Collection Container Dedicated Suction Canister 03/31/20 0803  Securement Method Leg strap 03/31/20 0803  Site Assessment Intact;Clean 03/31/20 0803  Output (mL) 500 mL 03/31/20 0651    Microbiology/Sepsis markers: Results for orders placed or performed during the hospital encounter of 03/30/20  MRSA PCR Screening     Status: None   Collection Time: 03/30/20 11:31 PM   Specimen: Nasopharyngeal  Result Value Ref Range Status   MRSA by PCR NEGATIVE NEGATIVE  Final    Comment:        The GeneXpert MRSA Assay (FDA approved for NASAL specimens only), is one component of a comprehensive MRSA colonization surveillance program. It is not intended to diagnose MRSA infection nor to guide or monitor treatment for MRSA infections. Performed at [facility], [address] [place], [place] [address]     Anti-infectives:  Anti-infectives (From admission, onward)   Start     Dose/Rate Route Frequency Ordered Stop   03/31/20 0000  Ampicillin-Sulbactam (UNASYN) 3 g in sodium chloride 0.9 % 100 mL IVPB     Discontinue     3 g 200 mL/hr over 30 Minutes Intravenous Every 6 hours 03/30/20 2347          PAST MEDICAL HISTORY   Past Medical History:  Diagnosis Date  . COPD (chronic obstructive pulmonary disease) ([place])   . Hypertension   . Nicotine addiction   . Peripheral vascular disease ([place])      SURGICAL HISTORY   Past Surgical History:  Procedure Laterality Date  . ABDOMINAL HYSTERECTOMY    . COLONOSCOPY WITH PROPOFOL N/A 02/21/2016   Procedure: COLONOSCOPY WITH PROPOFOL;  Surgeon: Lollie Sails, MD;  Location: [facility] ENDOSCOPY;  Service: Endoscopy;  Laterality: N/A;  . LOWER EXTREMITY ANGIOGRAPHY Right 05/28/2017   Procedure: Lower Extremity Angiography;  Surgeon: Katha Cabal, MD;  Location: [place] [facility];  Service: Cardiovascular;  Laterality: Right;  . MASTOIDECTOMY    . NECK SURGERY    . PERIPHERAL VASCULAR CATHETERIZATION Left 05/29/2016   Procedure: Lower Extremity Angiography;  Surgeon: Katha Cabal, MD;  Location: [place] [facility];  Service: Cardiovascular;  Laterality: Left;  . PERIPHERAL VASCULAR CATHETERIZATION  05/29/2016  Procedure: Lower Extremity Intervention;  Surgeon: Katha Cabal, MD;  Location: [place] [facility];  Service: Cardiovascular;;  . TONSILLECTOMY       FAMILY HISTORY   Family History  Problem Relation Age of Onset  . COPD Mother   . Hypertension Mother   .  COPD Father   . Breast cancer Maternal Grandmother 60  . Breast cancer Cousin 50       maternal side     SOCIAL HISTORY   Social History   Tobacco Use  . Smoking status: Former Smoker    Packs/day: 0.50    Years: 45.00    Pack years: 22.50    Types: Cigarettes  . Smokeless tobacco: Never Used  Vaping Use  . Vaping Use: Never used  Substance Use Topics  . Alcohol use: No  . Drug use: No     MEDICATIONS   Current Medication:  Current Facility-Administered Medications:  .  albuterol (PROVENTIL) (2.5 MG/3ML) 0.083% nebulizer solution 2.5 mg, 2.5 mg, Nebulization, Q4H PRN, Flora Lipps, MD, 2.5 mg at 04/01/20 0411 .  Ampicillin-Sulbactam (UNASYN) 3 g in sodium chloride 0.9 % 100 mL IVPB, 3 g, Intravenous, Q6H, Nelle Don, MD, Stopped at 04/01/20 [phone] .  budesonide (PULMICORT) nebulizer solution 0.25 mg, 0.25 mg, Nebulization, BID, Nelle Don, MD, 0.25 mg at 04/01/20 0735 .  Chlorhexidine Gluconate Cloth 2 % PADS 6 each, 6 each, Topical, Daily, Nelle Don, MD, 6 each at 03/31/20 1000 .  docusate sodium (COLACE) capsule 100 mg, 100 mg, Oral, BID PRN, Nelle Don, MD .  heparin injection 5,000 Units, 5,000 Units, Subcutaneous, Q8H, Nelle Don, MD, 5,000 Units at 04/01/20 [phone] .  ipratropium-albuterol (DUONEB) 0.5-2.5 (3) MG/3ML nebulizer solution 3 mL, 3 mL, Nebulization, Q4H, Kasa, Kurian, MD, 3 mL at 04/01/20 0735 .  methylPREDNISolone sodium succinate (SOLU-MEDROL) 40 mg/mL injection 40 mg, 40 mg, Intravenous, Q8H, Nelle Don, MD, 40 mg at 04/01/20 5409 .  polyethylene glycol (MIRALAX / GLYCOLAX) packet 17 g, 17 g, Oral, Daily PRN, Nelle Don, MD    ALLERGIES   Patient has no known allergies.    REVIEW OF SYSTEMS   Unable to obtain due to altered mental status   PHYSICAL EXAMINATION   Vital Signs: Temp:  [97 F (36.1 C)-98.6 F (37 C)] 98.6 F (37 C) (07/23 0800) Pulse Rate:  [88-151] 105 (07/23 0900) Resp:  [14-28] 16 (07/23  0900) BP: (107-174)/(54-96) 139/72 (07/23 0900) SpO2:  [93 %-100 %] 98 % (07/23 0900) Weight:  [45.1 kg] 45.1 kg (07/23 0500)  GENERAL:appears older then stated age HEAD: Normocephalic, atraumatic.  EYES: Pupils equal, round, reactive to light.  No scleral icterus.  MOUTH: Moist mucosal membrane. NECK: Supple. No thyromegaly. No nodules. No JVD.  PULMONARY: clear to auscultation  CARDIOVASCULAR: S1 and S2. Regular rate and rhythm. No murmurs, rubs, or gallops.  GASTROINTESTINAL: Soft, nontender, non-distended. No masses. Positive bowel sounds. No hepatosplenomegaly.  MUSCULOSKELETAL: No swelling, clubbing, or edema.  NEUROLOGIC: Mild distress due to acute illness, GCS10 SKIN:intact,warm,dry   PERTINENT DATA     Infusions: . ampicillin-sulbactam (UNASYN) IV Stopped (04/01/20 8119)   Scheduled Medications: . budesonide (PULMICORT) nebulizer solution  0.25 mg Nebulization BID  . Chlorhexidine Gluconate Cloth  6 each Topical Daily  . heparin  5,000 Units Subcutaneous Q8H  . ipratropium-albuterol  3 mL Nebulization Q4H  . methylPREDNISolone (SOLU-MEDROL) injection  40 mg Intravenous Q8H   PRN Medications: albuterol, docusate sodium, polyethylene glycol Hemodynamic parameters:   Intake/Output: 07/22 0701 -  07/23 0700 In: 1084.6 [IV Piggyback:1084.6] Out: 900 [Urine:900]  Ventilator  Settings:     LAB RESULTS:  Basic Metabolic Panel: Recent Labs  Lab 03/31/20 0507 03/31/20 0507 03/31/20 0913 03/31/20 0913 03/31/20 1245 03/31/20 1245 03/31/20 1713 03/31/20 1713 03/31/20 2208 03/31/20 2208 04/01/20 0349 04/01/20 0947  NA 123*   < > 125*   < > 126*  --  127*  --  129*  --  134* 132*  K 3.4*   < > 4.0   < > 4.1   < > 4.0   < > 4.2   < > 4.4 4.5  CL 82*   < > 83*   < > 84*  --  90*  --  88*  --  88* 89*  CO2 31   < > 30   < > 28  --  28  --  30  --  27 32  GLUCOSE 123*   < > 148*   < > 148*  --  147*  --  141*  --  151* 132*  BUN <5*   < > 5*   < > 6*  --  8  --   9  --  11 13  CREATININE 0.71   < > 0.65   < > 0.67  --  0.69  --  0.75  --  0.95 0.78  CALCIUM 8.2*   < > 8.6*   < > 8.4*  --  8.6*  --  8.6*  --  9.0 8.5*  MG 2.4  --  2.3  --  2.2  --  2.2  --  2.1  --   --   --    < > = values in this interval not displayed.   Liver Function Tests: Recent Labs  Lab 03/30/20 1925  AST 28  ALT 16  ALKPHOS 63  BILITOT 1.3*  PROT 5.8*  ALBUMIN 3.6   No results for input(s): LIPASE, AMYLASE in the last 168 hours. Recent Labs  Lab 03/31/20 0114  AMMONIA 17   CBC: Recent Labs  Lab 03/30/20 1925 03/30/20 2110 03/31/20 0114  WBC 6.2 6.2 5.5  NEUTROABS 3.9 3.7  --   HGB RESULTS UNAVAILABLE DUE TO INTERFERING SUBSTANCE 13.1 13.0  HCT RESULTS UNAVAILABLE DUE TO INTERFERING SUBSTANCE RESULTS UNAVAILABLE DUE TO INTERFERING SUBSTANCE RESULTS UNAVAILABLE DUE TO INTERFERING SUBSTANCE  MCV RESULTS UNAVAILABLE DUE TO INTERFERING SUBSTANCE RESULTS UNAVAILABLE DUE TO INTERFERING SUBSTANCE RESULTS UNAVAILABLE DUE TO INTERFERING SUBSTANCE  PLT 230 222 214   Cardiac Enzymes: No results for input(s): CKTOTAL, CKMB, CKMBINDEX, TROPONINI in the last 168 hours. BNP: Invalid input(s): POCBNP CBG: Recent Labs  Lab 03/30/20 2316  GLUCAP 93       IMAGING RESULTS:  Imaging: CT Head Wo Contrast  Result Date: 03/30/2020 CLINICAL DATA:  Unresponsive possible overdose EXAM: CT HEAD WITHOUT CONTRAST TECHNIQUE: Contiguous axial images were obtained from the base of the skull through the vertex without intravenous contrast. COMPARISON:  MRI 08/09/2016, CT brain 08/09/2016 FINDINGS: Brain: No evidence of acute infarction, hemorrhage, hydrocephalus, extra-axial collection or mass lesion/mass effect. Vascular: No hyperdense vessel.  Carotid vascular calcification. Skull: Normal. Negative for fracture or focal lesion. Sinuses/Orbits: No acute finding. Other: None IMPRESSION: Negative non contrasted CT appearance of the brain. Electronically Signed   By: Donavan Foil  M.D.   On: 03/30/2020 20:07   CT CHEST W CONTRAST  Result Date: 03/31/2020 CLINICAL DATA:  Cancer of unknown primary, altered mental status EXAM:  CT CHEST WITH CONTRAST TECHNIQUE: Multidetector CT imaging of the chest was performed during intravenous contrast administration. CONTRAST:  9mL OMNIPAQUE IOHEXOL 300 MG/ML  SOLN COMPARISON:  None. FINDINGS: Cardiovascular: Moderate coronary artery calcification. Global cardiac size within normal limits. No pericardial effusion. The central pulmonary arteries are enlarged in keeping with changes of pulmonary arterial hypertension. Atherosclerotic calcification is noted within the thoracic aorta and proximal arch vasculature. The thoracic aorta is of normal caliber. Mediastinum/Nodes: There is effacement of the fat planes within the soft tissues of the lower neck, not well characterized on this examination, but possibly the sequela of radiation therapy the appropriate clinical setting. No pathologic thoracic adenopathy. Esophagus unremarkable. Thyroid unremarkable. Lungs/Pleura: There is moderate to severe centrilobular emphysema present. Moderate, symmetric pulmonary hyperinflation is noted. There is debris within the right lower lobe bronchus extending into the segmental bronchi of the dependent segments of the right lower lobe likely representing a mucous plug or potentially aspirated foreign material. There is moderate bronchial wall thickening noted in keeping with chronic airway inflammation. No pneumothorax or pleural effusion. Upper Abdomen: Unremarkable Musculoskeletal: A T3 left hemivertebra is noted resulting in marked focal levoscoliosis of the a thoracic spine at this level. No acute bone abnormality. IMPRESSION: Moderate to severe centrilobular emphysema. Chronic airway inflammation. Occlusive debris within the right lower lobe bronchi possibly representing a mucous plug or aspirated foreign material. Moderate coronary artery calcification. Morphologic  changes in keeping with pulmonary arterial hypertension. Aortic Atherosclerosis (ICD10-I70.0) and Emphysema (ICD10-J43.9). Electronically Signed   By: Fidela Salisbury MD   On: 03/31/2020 19:23   MR BRAIN WO CONTRAST  Result Date: 03/31/2020 CLINICAL DATA:  Encephalopathy EXAM: MRI HEAD WITHOUT CONTRAST TECHNIQUE: Multiplanar, multiecho pulse sequences of the brain and surrounding structures were obtained without intravenous contrast. COMPARISON:  08/09/2016 brain MRI FINDINGS: Brain: No acute infarct, acute hemorrhage or extra-axial collection. Normal white matter signal. Normal volume of CSF spaces. No chronic microhemorrhage. Normal midline structures. Vascular: Normal flow voids. Skull and upper cervical spine: Normal marrow signal. Sinuses/Orbits: Bilateral mastoid fluid. Status post bilateral ocular lens replacement. Other: None IMPRESSION: 1. Normal MRI of the brain. 2. Bilateral mastoid fluid. Electronically Signed   By: Ulyses Jarred M.D.   On: 03/31/2020 22:15   DG Chest Portable 1 View  Result Date: 03/30/2020 CLINICAL DATA:  Altered mental status EXAM: PORTABLE CHEST 1 VIEW COMPARISON:  09/30/2018, 08/09/2016 FINDINGS: Hyperinflation with probable emphysematous disease. Stable cardiomediastinal silhouette. Mild asymmetric density at the right apex. No pneumothorax. IMPRESSION: 1. Hyperinflation with probable emphysematous disease. 2. Slight asymmetric opacity at the right lung apex. Chest CT may be considered for further evaluation to exclude airspace disease or mass at the right apex. Electronically Signed   By: Donavan Foil M.D.   On: 03/30/2020 22:14   ECHOCARDIOGRAM COMPLETE  Result Date: 04/01/2020    ECHOCARDIOGRAM REPORT   Patient Name:   FARHEEN PFAHLER Date of Exam: 03/31/2020 Medical Rec #:  [id number]      Height:       64.0 in Accession #:    [id number]     Weight:       97.9 lb Date of Birth:  03-Sep-1956       BSA:          1.444 m Patient Age:    78 years       BP:           156/91  mmHg Patient Gender: F  HR:           110 bpm. Exam Location:  [facility] Procedure: 2D Echo Indications:     CHF 428.21/I50.21  History:         Patient has no prior history of Echocardiogram examinations.                  Risk Factors:Current Smoker and Dyslipidemia.  Sonographer:     Avanell Shackleton Referring Phys:  [id number] ZHYQ Monika Chestang Diagnosing Phys: Bartholome Bill MD IMPRESSIONS  1. Left ventricular ejection fraction, by estimation, is 50 to 55%. The left ventricle has low normal function. Left ventricular endocardial border not optimally defined to evaluate regional wall motion. Left ventricular diastolic parameters were normal.  2. Right ventricular systolic function is normal. The right ventricular size is mildly enlarged.  3. Right atrial size was mildly dilated.  4. The mitral valve was not well visualized. Trivial mitral valve regurgitation.  5. The aortic valve is grossly normal. Aortic valve regurgitation is not visualized. FINDINGS  Left Ventricle: Left ventricular ejection fraction, by estimation, is 50 to 55%. The left ventricle has low normal function. Left ventricular endocardial border not optimally defined to evaluate regional wall motion. The left ventricular internal cavity  size was normal in size. There is borderline left ventricular hypertrophy. Left ventricular diastolic parameters were normal. Right Ventricle: The right ventricular size is mildly enlarged. No increase in right ventricular wall thickness. Right ventricular systolic function is normal. Left Atrium: Left atrial size was normal in size. Right Atrium: Right atrial size was mildly dilated. Pericardium: There is no evidence of pericardial effusion. Mitral Valve: The mitral valve was not well visualized. Trivial mitral valve regurgitation. Tricuspid Valve: The tricuspid valve is not well visualized. Tricuspid valve regurgitation is mild. Aortic Valve: The aortic valve is grossly normal. Aortic valve regurgitation is not  visualized. Pulmonic Valve: The pulmonic valve was not well visualized. Pulmonic valve regurgitation is trivial. Aorta: The aortic root was not well visualized. IAS/Shunts: The interatrial septum was not assessed.  LEFT VENTRICLE PLAX 2D LVIDd:         3.64 cm LVIDs:         3.02 cm LV PW:         0.69 cm LV IVS:        0.63 cm  IVC IVC diam: 1.46 cm LEFT ATRIUM           Index       RIGHT ATRIUM           Index LA diam:      3.00 cm 2.08 cm/m  RA Area:     10.50 cm LA Vol (A4C): 74.9 ml 51.87 ml/m RA Volume:   20.90 ml  14.47 ml/m   AORTA Ao Root diam: 3.10 cm MITRAL VALVE MV Area (PHT): 6.83 cm MV Decel Time: 111 msec MV E velocity: 110.00 cm/s MV A velocity: 92.70 cm/s MV E/A ratio:  1.19 Bartholome Bill MD Electronically signed by Bartholome Bill MD Signature Date/Time: 04/01/2020/8:40:31 AM    Final    @PROBHOSP @ CT CHEST W CONTRAST  Result Date: 03/31/2020 CLINICAL DATA:  Cancer of unknown primary, altered mental status EXAM: CT CHEST WITH CONTRAST TECHNIQUE: Multidetector CT imaging of the chest was performed during intravenous contrast administration. CONTRAST:  11mL OMNIPAQUE IOHEXOL 300 MG/ML  SOLN COMPARISON:  None. FINDINGS: Cardiovascular: Moderate coronary artery calcification. Global cardiac size within normal limits. No pericardial effusion. The central pulmonary arteries are enlarged in keeping with  changes of pulmonary arterial hypertension. Atherosclerotic calcification is noted within the thoracic aorta and proximal arch vasculature. The thoracic aorta is of normal caliber. Mediastinum/Nodes: There is effacement of the fat planes within the soft tissues of the lower neck, not well characterized on this examination, but possibly the sequela of radiation therapy the appropriate clinical setting. No pathologic thoracic adenopathy. Esophagus unremarkable. Thyroid unremarkable. Lungs/Pleura: There is moderate to severe centrilobular emphysema present. Moderate, symmetric pulmonary hyperinflation is  noted. There is debris within the right lower lobe bronchus extending into the segmental bronchi of the dependent segments of the right lower lobe likely representing a mucous plug or potentially aspirated foreign material. There is moderate bronchial wall thickening noted in keeping with chronic airway inflammation. No pneumothorax or pleural effusion. Upper Abdomen: Unremarkable Musculoskeletal: A T3 left hemivertebra is noted resulting in marked focal levoscoliosis of the a thoracic spine at this level. No acute bone abnormality. IMPRESSION: Moderate to severe centrilobular emphysema. Chronic airway inflammation. Occlusive debris within the right lower lobe bronchi possibly representing a mucous plug or aspirated foreign material. Moderate coronary artery calcification. Morphologic changes in keeping with pulmonary arterial hypertension. Aortic Atherosclerosis (ICD10-I70.0) and Emphysema (ICD10-J43.9). Electronically Signed   By: Fidela Salisbury MD   On: 03/31/2020 19:23   MR BRAIN WO CONTRAST  Result Date: 03/31/2020 CLINICAL DATA:  Encephalopathy EXAM: MRI HEAD WITHOUT CONTRAST TECHNIQUE: Multiplanar, multiecho pulse sequences of the brain and surrounding structures were obtained without intravenous contrast. COMPARISON:  08/09/2016 brain MRI FINDINGS: Brain: No acute infarct, acute hemorrhage or extra-axial collection. Normal white matter signal. Normal volume of CSF spaces. No chronic microhemorrhage. Normal midline structures. Vascular: Normal flow voids. Skull and upper cervical spine: Normal marrow signal. Sinuses/Orbits: Bilateral mastoid fluid. Status post bilateral ocular lens replacement. Other: None IMPRESSION: 1. Normal MRI of the brain. 2. Bilateral mastoid fluid. Electronically Signed   By: Ulyses Jarred M.D.   On: 03/31/2020 22:15   ECHOCARDIOGRAM COMPLETE  Result Date: 04/01/2020    ECHOCARDIOGRAM REPORT   Patient Name:   Wendy Ferrell Date of Exam: 03/31/2020 Medical Rec #:  [id number]       Height:       64.0 in Accession #:    [id number]     Weight:       97.9 lb Date of Birth:  1955/12/28       BSA:          1.444 m Patient Age:    10 years       BP:           156/91 mmHg Patient Gender: F              HR:           110 bpm. Exam Location:  [facility] Procedure: 2D Echo Indications:     CHF 428.21/I50.21  History:         Patient has no prior history of Echocardiogram examinations.                  Risk Factors:Current Smoker and Dyslipidemia.  Sonographer:     Avanell Shackleton Referring Phys:  [id number] SWHQ Devita Nies Diagnosing Phys: Bartholome Bill MD IMPRESSIONS  1. Left ventricular ejection fraction, by estimation, is 50 to 55%. The left ventricle has low normal function. Left ventricular endocardial border not optimally defined to evaluate regional wall motion. Left ventricular diastolic parameters were normal.  2. Right ventricular systolic function is normal. The right ventricular size is mildly enlarged.  3. Right atrial size was mildly dilated.  4. The mitral valve was not well visualized. Trivial mitral valve regurgitation.  5. The aortic valve is grossly normal. Aortic valve regurgitation is not visualized. FINDINGS  Left Ventricle: Left ventricular ejection fraction, by estimation, is 50 to 55%. The left ventricle has low normal function. Left ventricular endocardial border not optimally defined to evaluate regional wall motion. The left ventricular internal cavity  size was normal in size. There is borderline left ventricular hypertrophy. Left ventricular diastolic parameters were normal. Right Ventricle: The right ventricular size is mildly enlarged. No increase in right ventricular wall thickness. Right ventricular systolic function is normal. Left Atrium: Left atrial size was normal in size. Right Atrium: Right atrial size was mildly dilated. Pericardium: There is no evidence of pericardial effusion. Mitral Valve: The mitral valve was not well visualized. Trivial mitral valve regurgitation.  Tricuspid Valve: The tricuspid valve is not well visualized. Tricuspid valve regurgitation is mild. Aortic Valve: The aortic valve is grossly normal. Aortic valve regurgitation is not visualized. Pulmonic Valve: The pulmonic valve was not well visualized. Pulmonic valve regurgitation is trivial. Aorta: The aortic root was not well visualized. IAS/Shunts: The interatrial septum was not assessed.  LEFT VENTRICLE PLAX 2D LVIDd:         3.64 cm LVIDs:         3.02 cm LV PW:         0.69 cm LV IVS:        0.63 cm  IVC IVC diam: 1.46 cm LEFT ATRIUM           Index       RIGHT ATRIUM           Index LA diam:      3.00 cm 2.08 cm/m  RA Area:     10.50 cm LA Vol (A4C): 74.9 ml 51.87 ml/m RA Volume:   20.90 ml  14.47 ml/m   AORTA Ao Root diam: 3.10 cm MITRAL VALVE MV Area (PHT): 6.83 cm MV Decel Time: 111 msec MV E velocity: 110.00 cm/s MV A velocity: 92.70 cm/s MV E/A ratio:  1.19 Bartholome Bill MD Electronically signed by Bartholome Bill MD Signature Date/Time: 04/01/2020/8:40:31 AM    Final            ASSESSMENT AND PLAN    -Multidisciplinary rounds held today  Acute mental status with lethargy   - likely due to hyponatremia               -s/p 3NS >>salt tabs with NS>>LR only >> no fluids 7/22 -etiology of hyponatremia is under investigation  - monitor neuro exam closely    Hypotonic Euvolemic Hyponatremia  -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  - chronic smoker with abnormal CXR possible lung CA related -CT chest reviewed - no signs of tumor but RLL endobronchial lesion found  -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  - TSH-wnl  - Tylenol, ETOH, ASA all normal - no hx of CHF or liver dz  - UA suggestive of absence of infection  -monitor Na q4h   -blood cultures ordered - patient placed on unasyn empirically   -TCA is elevated on UDS- patient may have psychatric therapy related hyponatremia and AMS   Advanced centrilobular emphysema with COPD  typical COPD carepath  -will d/c steroids as there  are no signs of AECOPD at this time - will obtain procalcitonin to guide antimicrobials - MRSA pcr  - RVP for viral LRTI rule out  ID -continue IV abx as prescibed -follow up cultures  GI/Nutrition GI PROPHYLAXIS as indicated DIET-->TF's as tolerated Constipation protocol as indicated  ENDO - ICU hypoglycemic\Hyperglycemia protocol -check FSBS per protocol   ELECTROLYTES -follow labs as needed -replace as needed -pharmacy consultation   DVT/GI PRX ordered -SCDs  TRANSFUSIONS AS NEEDED MONITOR FSBS ASSESS the need for LABS as needed   Critical care provider statement:    Critical care time (minutes):  34   Critical care time was exclusive of:  Separately billable procedures and treating other patients   Critical care was necessary to treat or prevent imminent or life-threatening deterioration of the following conditions:  Altered mental status with lethargy, severe hyponatremia, COPD with emphysema, multiple comorbid conditions   Critical care was time spent personally by me on the following activities:  Development of treatment plan with patient or surrogate, discussions with consultants, evaluation of patient's response to treatment, examination of patient, obtaining history from patient or surrogate, ordering and performing treatments and interventions, ordering and review of laboratory studies and re-evaluation of patient's condition.  I assumed direction of critical care for this patient from another provider in my specialty: no    This document was prepared using Dragon voice recognition software and may include unintentional dictation errors.    Ottie Glazier, M.D.  Division of [facility]

## 2020-04-01 NOTE — Progress Notes (Signed)
PHARMACY CONSULT NOTE - FOLLOW UP  Pharmacy Consult for Electrolyte Monitoring and Replacement   Recent Labs: Potassium (mmol/L)  Date Value  04/01/2020 4.5   Magnesium (mg/dL)  Date Value  03/31/2020 2.1   Calcium (mg/dL)  Date Value  04/01/2020 8.5 (L)   Albumin (g/dL)  Date Value  03/30/2020 3.6   Sodium (mmol/L)  Date Value  04/01/2020 132 (L)   Assessment: Patient admitted to ICU with hyponatremia. Initially started on hypertonic saline which was then changed to NS and then LR due to rapid correction of sodium. Currently NPO in setting of AMS.  Goal of Therapy:  Lytes WNL, K+ >/= 4  Plan:  Potassium continues to trend up although patient is not currently receiving replacement or nutrition. Sodium improved, off fluids. Patient unable to safely take PO at this time. Will follow up all electrolytes with morning labs.  Tawnya Crook ,PharmD Clinical Pharmacist 04/01/2020 11:00 AM

## 2020-04-01 NOTE — Progress Notes (Addendum)
SLP Cancellation Note  Patient Details Name: Wendy Ferrell MRN: [id number] DOB: [birth year]   Cancelled treatment:       Reason Eval/Treat Not Completed: Patient's level of consciousness;Medical issues which prohibited therapy (chart reviewed; consulted NSG and MD).  Pt was not responsive to Max verbal/tactile stim by this SLP even despite repositioning and oral care. During oral care by this SLP, NO Gag Reflex was noted. Pt made no significant physical response to stim this session. The above information was relayed to MD/NSG. Educated family member on providing oral care for hygiene and stimulation of swallowing; aspiration precautions.  ST services will f/u to monitor pt's status and appropriateness for BSE, po's. MD/NSG agreed.     Orinda Kenner, MS, CCC-SLP [facility] 04/01/2020, 11:44 AM

## 2020-04-01 NOTE — Progress Notes (Signed)
eeg completed ° °

## 2020-04-01 NOTE — Progress Notes (Signed)
PHARMACY CONSULT NOTE - FOLLOW UP  Pharmacy Consult for Electrolyte Monitoring and Replacement   Recent Labs: Potassium (mmol/L)  Date Value  04/01/2020 4.4   Magnesium (mg/dL)  Date Value  03/31/2020 2.1   Calcium (mg/dL)  Date Value  04/01/2020 9.0   Albumin (g/dL)  Date Value  03/30/2020 3.6   Sodium (mmol/L)  Date Value  04/01/2020 134 (L)   Assessment: Patient admitted to ICU with hyponatremia. Initially started on hypertonic saline which was then changed to NS and then LR due to rapid correction of sodium. Currently NPO in setting of AMS.  Goal of Therapy:  Lytes WNL, K+ >/= 4  Plan:  Potassium improved. Sodium continues to trend up gradually.  No additional supplement at this time. Will replace potassium with IV potassium runs if drops again. Will continue to follow BMP q6h for now.   Ena Dawley ,PharmD Clinical Pharmacist 04/01/2020 5:21 AM

## 2020-04-01 NOTE — Progress Notes (Signed)
*  PRELIMINARY RESULTS* Echocardiogram 2D Echocardiogram has been performed.  Wendy Ferrell 04/01/2020, 6:59 AM

## 2020-04-01 NOTE — Progress Notes (Signed)
PCCM X-COVER  Abrupt onset of wheeze and respiratory distress, with accessory muscles/tripod Agitation trying to exit bed from fear of WOB  Audible wheeze   Back to back nebs provided x2 Dilaudid 0.25mg  x1 only Required lopressor for tachycardia in excess of 170 and SBP >729, avoid diastolic component  With these measures and coaching the patient improved.   //Lynesha Bango

## 2020-04-01 NOTE — Progress Notes (Signed)
RT called to patient bedside due to respiratory distress. Patient experiencing shortness of breath as well as inspiratory/expiratory wheezing. Respirations +40bpm, heart rate +140bpm. Per MD, okay to give additional PRN albuterol treatment. Patient respirations improved to approximately 26bpm with breathing treatment. Patient has bilateral inspiratory and expiratory wheezes noted but no longer in distress. Patient weaned from Non-rebreather mask back down to 3L [place].  Will continue to monitor.

## 2020-04-01 NOTE — Procedures (Signed)
Patient Name: Wendy Ferrell  MRN: [id number]  Epilepsy Attending: Lora Havens  Referring Physician/Provider: Dr Karena Addison Aroor Date: 04/01/2020 Duration: 23.12 mins  Patient history: 64 year old female presents with hyponatremia secondary to SIADH, concerns for Flexeril overdose. Remains obtunded despite correction of sodium neurology consulted.  EEG to evaluate for seizure.  Level of alertness: Awake  AEDs during EEG study: None  Technical aspects: This EEG study was done with scalp electrodes positioned according to the 10-20 International system of electrode placement. Electrical activity was acquired at a sampling rate of 500Hz  and reviewed with a high frequency filter of 70Hz  and a low frequency filter of 1Hz . EEG data were recorded continuously and digitally stored.   Description: The posterior dominant rhythm consists of 8 Hz activity of moderate voltage (25-35 uV) seen predominantly in posterior head regions, symmetric and reactive to eye opening and eye closing. EEG showed intermittent generalized 3 to 5 Hz theta-delta slowing.  Hyperventilation and photic stimulation were not performed.     ABNORMALITY -Intermittent slow, generalized  IMPRESSION: This study is suggestive of mild diffuse encephalopathy, nonspecific etiology. No seizures or epileptiform discharges were seen throughout the recording.  Wendy Ferrell

## 2020-04-01 NOTE — Progress Notes (Signed)
PHARMACY CONSULT NOTE - FOLLOW UP  Pharmacy Consult for Electrolyte Monitoring and Replacement   Recent Labs: Potassium (mmol/L)  Date Value  03/31/2020 4.2   Magnesium (mg/dL)  Date Value  03/31/2020 2.1   Calcium (mg/dL)  Date Value  03/31/2020 8.6 (L)   Albumin (g/dL)  Date Value  03/30/2020 3.6   Sodium (mmol/L)  Date Value  03/31/2020 129 (L)   Assessment: Patient admitted to ICU with hyponatremia. Initially started on hypertonic saline which was then changed to NS and then LR due to rapid correction of sodium. Currently NPO in setting of AMS.  Goal of Therapy:  Lytes WNL, K+ >/= 4  Plan:  Potassium improved. Sodium continues to trend up gradually.  No additional supplement at this time. Will replace potassium with IV potassium runs if drops again. Will continue to follow BMP q4h for now.   Ena Dawley ,PharmD Clinical Pharmacist 04/01/2020 12:29 AM

## 2020-04-02 DIAGNOSIS — R4182 Altered mental status, unspecified: Secondary | ICD-10-CM | POA: Diagnosis not present

## 2020-04-02 LAB — CBC WITH DIFFERENTIAL/PLATELET
Abs Immature Granulocytes: 0.08 10*3/uL — ABNORMAL HIGH (ref 0.00–0.07)
Basophils Absolute: 0 10*3/uL (ref 0.0–0.1)
Basophils Relative: 0 %
Eosinophils Absolute: 0 10*3/uL (ref 0.0–0.5)
Eosinophils Relative: 0 %
HCT: 36 % (ref 36.0–46.0)
Hemoglobin: 12.5 g/dL (ref 12.0–15.0)
Immature Granulocytes: 1 %
Lymphocytes Relative: 3 %
Lymphs Abs: 0.3 10*3/uL — ABNORMAL LOW (ref 0.7–4.0)
MCH: 34.2 pg — ABNORMAL HIGH (ref 26.0–34.0)
MCHC: 34.7 g/dL (ref 30.0–36.0)
MCV: 98.6 fL (ref 80.0–100.0)
Monocytes Absolute: 0.4 10*3/uL (ref 0.1–1.0)
Monocytes Relative: 3 %
Neutro Abs: 11.4 10*3/uL — ABNORMAL HIGH (ref 1.7–7.7)
Neutrophils Relative %: 93 %
Platelets: 248 10*3/uL (ref 150–400)
RBC: 3.65 MIL/uL — ABNORMAL LOW (ref 3.87–5.11)
RDW: 13.7 % (ref 11.5–15.5)
WBC: 12.3 10*3/uL — ABNORMAL HIGH (ref 4.0–10.5)
nRBC: 0 % (ref 0.0–0.2)

## 2020-04-02 LAB — BASIC METABOLIC PANEL WITH GFR
Anion gap: 10 (ref 5–15)
BUN: 18 mg/dL (ref 8–23)
CO2: 36 mmol/L — ABNORMAL HIGH (ref 22–32)
Calcium: 8.6 mg/dL — ABNORMAL LOW (ref 8.9–10.3)
Chloride: 90 mmol/L — ABNORMAL LOW (ref 98–111)
Creatinine, Ser: 0.84 mg/dL (ref 0.44–1.00)
GFR calc Af Amer: >60 mL/min
GFR calc non Af Amer: >60 mL/min
Glucose, Bld: 126 mg/dL — ABNORMAL HIGH (ref 70–99)
Potassium: 4.5 mmol/L (ref 3.5–5.1)
Sodium: 136 mmol/L (ref 135–145)

## 2020-04-02 LAB — MAGNESIUM: Magnesium: 2.3 mg/dL (ref 1.7–2.4)

## 2020-04-02 LAB — PHOSPHORUS: Phosphorus: 4 mg/dL (ref 2.5–4.6)

## 2020-04-02 MED ORDER — IPRATROPIUM-ALBUTEROL 0.5-2.5 (3) MG/3ML IN SOLN
3.0000 mL | Freq: Four times a day (QID) | RESPIRATORY_TRACT | Status: DC
  Administered 2020-04-03 – 2020-04-04 (×6): 3 mL via RESPIRATORY_TRACT
  Filled 2020-04-02 (×6): qty 3

## 2020-04-02 NOTE — Progress Notes (Signed)
PHARMACY CONSULT NOTE - FOLLOW UP  Pharmacy Consult for Electrolyte Monitoring and Replacement   Recent Labs: Potassium (mmol/L)  Date Value  04/02/2020 4.5   Magnesium (mg/dL)  Date Value  04/02/2020 2.3   Calcium (mg/dL)  Date Value  04/02/2020 8.6 (L)   Albumin (g/dL)  Date Value  03/30/2020 3.6   Phosphorus (mg/dL)  Date Value  04/02/2020 4.0   Sodium (mmol/L)  Date Value  04/02/2020 136   Assessment: Patient admitted to ICU with hyponatremia. Initially started on hypertonic saline which was then changed to NS and then LR due to rapid correction of sodium. Currently NPO in setting of AMS.  Goal of Therapy:  Lytes WNL, K+ >/= 4  Plan:  Electrolytes WNL. No supplementation at this time. Dysphagia 3 diet started today 7/24. Will follow up all electrolytes with morning labs.  Noralee Space ,PharmD Clinical Pharmacist 04/02/2020 11:52 AM

## 2020-04-02 NOTE — Progress Notes (Signed)
CRITICAL CARE PROGRESS NOTE    Name: Wendy Ferrell MRN: [id number] DOB: Jan 13, 1956     LOS: 3   SUBJECTIVE FINDINGS & SIGNIFICANT EVENTS    Patient description:  Wendy Ferrell is a 64 yo WF who presented via the [facility] ED to the ICU for altered mental status. The patient was transported via EMS after having been called by the patient's husband. Upon squad arrival, they noted Wendy Ferrell to be somnolent with two unidentified tablets nearby. Narcan was administered with little benefit. Oxygen was administered as her initial saturations were in the mid-80's. The patient is known to have COPD, there is some question as to her potential home oxygen need at baseline. There are no benzos or narcotics prescribed to her, and a UDS was notable for tricyclics only, however she does take flexeril. Initial lab evaluation revealed a metabolic disarray with low sodium of 114 and hypokalemia. No diuretic medication was noted on her list and her ethanol level was <10. A presenting blood gas was not available, however a CXR was without much acute finding. She was wheezing and rhonchorous on her admission to the ICU   04/01/20- Discussed hospital course with neurologist today, there is plan for EEG and additional ancillary testing.  04/02/20- patient clinically improved, shes able to speak and recall from memory.   Lines/tubes : External Urinary Catheter (Active)  Collection Container Dedicated Suction Canister 03/31/20 0803  Securement Method Leg strap 03/31/20 0803  Site Assessment Intact;Clean 03/31/20 0803  Output (mL) 500 mL 03/31/20 0651    Microbiology/Sepsis markers: Results for orders placed or performed during the hospital encounter of 03/30/20  MRSA PCR Screening     Status: None   Collection Time: 03/30/20 11:31 PM    Specimen: Nasopharyngeal  Result Value Ref Range Status   MRSA by PCR NEGATIVE NEGATIVE Final    Comment:        The GeneXpert MRSA Assay (FDA approved for NASAL specimens only), is one component of a comprehensive MRSA colonization surveillance program. It is not intended to diagnose MRSA infection nor to guide or monitor treatment for MRSA infections. Performed at [facility], [address] [place], [place] [address]     Anti-infectives:  Anti-infectives (From admission, onward)   Start     Dose/Rate Route Frequency Ordered Stop   03/31/20 0000  Ampicillin-Sulbactam (UNASYN) 3 g in sodium chloride 0.9 % 100 mL IVPB     Discontinue     3 g 200 mL/hr over 30 Minutes Intravenous Every 6 hours 03/30/20 2347          PAST MEDICAL HISTORY   Past Medical History:  Diagnosis Date  . COPD (chronic obstructive pulmonary disease) ([place])   . Hypertension   . Nicotine addiction   . Peripheral vascular disease ([place])      SURGICAL HISTORY   Past Surgical History:  Procedure Laterality Date  . ABDOMINAL HYSTERECTOMY    . COLONOSCOPY WITH PROPOFOL N/A 02/21/2016   Procedure: COLONOSCOPY WITH PROPOFOL;  Surgeon: Lollie Sails, MD;  Location: [facility] ENDOSCOPY;  Service: Endoscopy;  Laterality: N/A;  . LOWER EXTREMITY ANGIOGRAPHY Right 05/28/2017   Procedure: Lower Extremity Angiography;  Surgeon: Katha Cabal, MD;  Location: [place] CV LAB;  Service: Cardiovascular;  Laterality: Right;  . MASTOIDECTOMY    . NECK SURGERY    . PERIPHERAL VASCULAR CATHETERIZATION Left 05/29/2016   Procedure: Lower Extremity Angiography;  Surgeon: Katha Cabal, MD;  Location: [place] CV LAB;  Service: Cardiovascular;  Laterality: Left;  . PERIPHERAL VASCULAR CATHETERIZATION  05/29/2016   Procedure: Lower Extremity Intervention;  Surgeon: Katha Cabal, MD;  Location: [place] CV LAB;  Service: Cardiovascular;;  . TONSILLECTOMY       FAMILY HISTORY    Family History  Problem Relation Age of Onset  . COPD Mother   . Hypertension Mother   . COPD Father   . Breast cancer Maternal Grandmother 60  . Breast cancer Cousin 50       maternal side     SOCIAL HISTORY   Social History   Tobacco Use  . Smoking status: Former Smoker    Packs/day: 0.50    Years: 45.00    Pack years: 22.50    Types: Cigarettes  . Smokeless tobacco: Never Used  Vaping Use  . Vaping Use: Never used  Substance Use Topics  . Alcohol use: No  . Drug use: No     MEDICATIONS   Current Medication:  Current Facility-Administered Medications:  .  albuterol (PROVENTIL) (2.5 MG/3ML) 0.083% nebulizer solution 2.5 mg, 2.5 mg, Nebulization, Q4H PRN, Flora Lipps, MD, 2.5 mg at 04/01/20 0411 .  Ampicillin-Sulbactam (UNASYN) 3 g in sodium chloride 0.9 % 100 mL IVPB, 3 g, Intravenous, Q6H, Nelle Don, MD, Last Rate: 200 mL/hr at 04/02/20 1306, 3 g at 04/02/20 1306 .  budesonide (PULMICORT) nebulizer solution 0.25 mg, 0.25 mg, Nebulization, BID, Nelle Don, MD, 0.25 mg at 04/02/20 0807 .  Chlorhexidine Gluconate Cloth 2 % PADS 6 each, 6 each, Topical, Daily, Nelle Don, MD, 6 each at 04/02/20 1100 .  docusate sodium (COLACE) capsule 100 mg, 100 mg, Oral, BID PRN, Nelle Don, MD .  heparin injection 5,000 Units, 5,000 Units, Subcutaneous, Q8H, Nelle Don, MD, 5,000 Units at 04/02/20 1306 .  ipratropium-albuterol (DUONEB) 0.5-2.5 (3) MG/3ML nebulizer solution 3 mL, 3 mL, Nebulization, Q4H, Kasa, Kurian, MD, 3 mL at 04/02/20 1135 .  methylPREDNISolone sodium succinate (SOLU-MEDROL) 40 mg/mL injection 40 mg, 40 mg, Intravenous, Q8H, Nelle Don, MD, 40 mg at 04/02/20 0438 .  polyethylene glycol (MIRALAX / GLYCOLAX) packet 17 g, 17 g, Oral, Daily PRN, Nelle Don, MD    ALLERGIES   Patient has no known allergies.    REVIEW OF SYSTEMS   Unable to obtain due to altered mental status   PHYSICAL EXAMINATION   Vital Signs: Temp:   [97.6 F (36.4 C)-98.8 F (37.1 C)] 97.8 F (36.6 C) (07/24 0800) Pulse Rate:  [89-115] 107 (07/24 1100) Resp:  [17-28] 23 (07/24 1100) BP: (136-157)/(62-118) 139/81 (07/24 1100) SpO2:  [95 %-100 %] 100 % (07/24 1100)  GENERAL:appears older then stated age HEAD: Normocephalic, atraumatic.  EYES: Pupils equal, round, reactive to light.  No scleral icterus.  MOUTH: Moist mucosal membrane. NECK: Supple. No thyromegaly. No nodules. No JVD.  PULMONARY: clear to auscultation  CARDIOVASCULAR: S1 and S2. Regular rate and rhythm. No murmurs, rubs, or gallops.  GASTROINTESTINAL: Soft, nontender, non-distended. No masses. Positive bowel sounds. No hepatosplenomegaly.  MUSCULOSKELETAL: No swelling, clubbing, or edema.  NEUROLOGIC: Mild distress due to acute illness, GCS10 SKIN:intact,warm,dry   PERTINENT DATA     Infusions: . ampicillin-sulbactam (UNASYN) IV 3 g (04/02/20 1306)   Scheduled Medications: . budesonide (PULMICORT) nebulizer solution  0.25 mg Nebulization BID  . Chlorhexidine Gluconate Cloth  6 each Topical Daily  . heparin  5,000 Units Subcutaneous Q8H  . ipratropium-albuterol  3 mL Nebulization Q4H  . methylPREDNISolone (SOLU-MEDROL) injection  40 mg Intravenous Q8H  PRN Medications: albuterol, docusate sodium, polyethylene glycol Hemodynamic parameters:   Intake/Output: 07/23 0701 - 07/24 0700 In: 403.3 [IV Piggyback:403.3] Out: -   Ventilator  Settings:     LAB RESULTS:  Basic Metabolic Panel: Recent Labs  Lab 03/31/20 0913 03/31/20 0913 03/31/20 1245 03/31/20 1245 03/31/20 1713 03/31/20 1713 03/31/20 2208 03/31/20 2208 04/01/20 0349 04/01/20 0349 04/01/20 0947 04/02/20 0443  NA 125*   < > 126*   < > 127*  --  129*  --  134*  --  132* 136  K 4.0   < > 4.1   < > 4.0   < > 4.2   < > 4.4   < > 4.5 4.5  CL 83*   < > 84*   < > 90*  --  88*  --  88*  --  89* 90*  CO2 30   < > 28   < > 28  --  30  --  27  --  32 36*  GLUCOSE 148*   < > 148*   < >  147*  --  141*  --  151*  --  132* 126*  BUN 5*   < > 6*   < > 8  --  9  --  11  --  13 18  CREATININE 0.65   < > 0.67   < > 0.69  --  0.75  --  0.95  --  0.78 0.84  CALCIUM 8.6*   < > 8.4*   < > 8.6*  --  8.6*  --  9.0  --  8.5* 8.6*  MG 2.3  --  2.2  --  2.2  --  2.1  --   --   --   --  2.3  PHOS  --   --   --   --   --   --   --   --   --   --   --  4.0   < > = values in this interval not displayed.   Liver Function Tests: Recent Labs  Lab 03/30/20 1925  AST 28  ALT 16  ALKPHOS 63  BILITOT 1.3*  PROT 5.8*  ALBUMIN 3.6   No results for input(s): LIPASE, AMYLASE in the last 168 hours. Recent Labs  Lab 03/31/20 0114  AMMONIA 17   CBC: Recent Labs  Lab 03/30/20 1925 03/30/20 2110 03/31/20 0114 04/02/20 0443  WBC 6.2 6.2 5.5 12.3*  NEUTROABS 3.9 3.7  --  11.4*  HGB RESULTS UNAVAILABLE DUE TO INTERFERING SUBSTANCE 13.1 13.0 12.5  HCT RESULTS UNAVAILABLE DUE TO INTERFERING SUBSTANCE RESULTS UNAVAILABLE DUE TO INTERFERING SUBSTANCE RESULTS UNAVAILABLE DUE TO INTERFERING SUBSTANCE 36.0  MCV RESULTS UNAVAILABLE DUE TO INTERFERING SUBSTANCE RESULTS UNAVAILABLE DUE TO INTERFERING SUBSTANCE RESULTS UNAVAILABLE DUE TO INTERFERING SUBSTANCE 98.6  PLT 230 222 214 248   Cardiac Enzymes: No results for input(s): CKTOTAL, CKMB, CKMBINDEX, TROPONINI in the last 168 hours. BNP: Invalid input(s): POCBNP CBG: Recent Labs  Lab 03/30/20 2316  GLUCAP 93       IMAGING RESULTS:  Imaging: EEG  Result Date: 04/01/2020 Lora Havens, MD     04/01/2020  5:04 PM Patient Name: JASLEEN RIEPE MRN: [id number] Epilepsy Attending: Lora Havens Referring Physician/Provider: Dr Karena Addison Aroor Date: 04/01/2020 Duration: 23.12 mins Patient history: 64 year old female presents with hyponatremia secondary to SIADH, concerns for Flexeril overdose. Remains obtunded despite correction of sodium neurology consulted.  EEG to evaluate for seizure.  Level of alertness: Awake AEDs during EEG study: None  Technical aspects: This EEG study was done with scalp electrodes positioned according to the 10-20 International system of electrode placement. Electrical activity was acquired at a sampling rate of 500Hz  and reviewed with a high frequency filter of 70Hz  and a low frequency filter of 1Hz . EEG data were recorded continuously and digitally stored. Description: The posterior dominant rhythm consists of 8 Hz activity of moderate voltage (25-35 uV) seen predominantly in posterior head regions, symmetric and reactive to eye opening and eye closing. EEG showed intermittent generalized 3 to 5 Hz theta-delta slowing.  Hyperventilation and photic stimulation were not performed.   ABNORMALITY -Intermittent slow, generalized IMPRESSION: This study is suggestive of mild diffuse encephalopathy, nonspecific etiology. No seizures or epileptiform discharges were seen throughout the recording. Lora Havens   CT CHEST W CONTRAST  Result Date: 03/31/2020 CLINICAL DATA:  Cancer of unknown primary, altered mental status EXAM: CT CHEST WITH CONTRAST TECHNIQUE: Multidetector CT imaging of the chest was performed during intravenous contrast administration. CONTRAST:  19mL OMNIPAQUE IOHEXOL 300 MG/ML  SOLN COMPARISON:  None. FINDINGS: Cardiovascular: Moderate coronary artery calcification. Global cardiac size within normal limits. No pericardial effusion. The central pulmonary arteries are enlarged in keeping with changes of pulmonary arterial hypertension. Atherosclerotic calcification is noted within the thoracic aorta and proximal arch vasculature. The thoracic aorta is of normal caliber. Mediastinum/Nodes: There is effacement of the fat planes within the soft tissues of the lower neck, not well characterized on this examination, but possibly the sequela of radiation therapy the appropriate clinical setting. No pathologic thoracic adenopathy. Esophagus unremarkable. Thyroid unremarkable. Lungs/Pleura: There is moderate to severe  centrilobular emphysema present. Moderate, symmetric pulmonary hyperinflation is noted. There is debris within the right lower lobe bronchus extending into the segmental bronchi of the dependent segments of the right lower lobe likely representing a mucous plug or potentially aspirated foreign material. There is moderate bronchial wall thickening noted in keeping with chronic airway inflammation. No pneumothorax or pleural effusion. Upper Abdomen: Unremarkable Musculoskeletal: A T3 left hemivertebra is noted resulting in marked focal levoscoliosis of the a thoracic spine at this level. No acute bone abnormality. IMPRESSION: Moderate to severe centrilobular emphysema. Chronic airway inflammation. Occlusive debris within the right lower lobe bronchi possibly representing a mucous plug or aspirated foreign material. Moderate coronary artery calcification. Morphologic changes in keeping with pulmonary arterial hypertension. Aortic Atherosclerosis (ICD10-I70.0) and Emphysema (ICD10-J43.9). Electronically Signed   By: Fidela Salisbury MD   On: 03/31/2020 19:23   MR BRAIN WO CONTRAST  Result Date: 03/31/2020 CLINICAL DATA:  Encephalopathy EXAM: MRI HEAD WITHOUT CONTRAST TECHNIQUE: Multiplanar, multiecho pulse sequences of the brain and surrounding structures were obtained without intravenous contrast. COMPARISON:  08/09/2016 brain MRI FINDINGS: Brain: No acute infarct, acute hemorrhage or extra-axial collection. Normal white matter signal. Normal volume of CSF spaces. No chronic microhemorrhage. Normal midline structures. Vascular: Normal flow voids. Skull and upper cervical spine: Normal marrow signal. Sinuses/Orbits: Bilateral mastoid fluid. Status post bilateral ocular lens replacement. Other: None IMPRESSION: 1. Normal MRI of the brain. 2. Bilateral mastoid fluid. Electronically Signed   By: Ulyses Jarred M.D.   On: 03/31/2020 22:15   ECHOCARDIOGRAM COMPLETE  Result Date: 04/01/2020    ECHOCARDIOGRAM REPORT    Patient Name:   ILIANY LOSIER Date of Exam: 03/31/2020 Medical Rec #:  [id number]      Height:       64.0 in Accession #:    [id number]  Weight:       97.9 lb Date of Birth:  15-Feb-1956       BSA:          1.444 m Patient Age:    53 years       BP:           156/91 mmHg Patient Gender: F              HR:           110 bpm. Exam Location:  [facility] Procedure: 2D Echo Indications:     CHF 428.21/I50.21  History:         Patient has no prior history of Echocardiogram examinations.                  Risk Factors:Current Smoker and Dyslipidemia.  Sonographer:     Avanell Shackleton Referring Phys:  [id number] Yarexi Pawlicki Diagnosing Phys: Bartholome Bill MD IMPRESSIONS  1. Left ventricular ejection fraction, by estimation, is 50 to 55%. The left ventricle has low normal function. Left ventricular endocardial border not optimally defined to evaluate regional wall motion. Left ventricular diastolic parameters were normal.  2. Right ventricular systolic function is normal. The right ventricular size is mildly enlarged.  3. Right atrial size was mildly dilated.  4. The mitral valve was not well visualized. Trivial mitral valve regurgitation.  5. The aortic valve is grossly normal. Aortic valve regurgitation is not visualized. FINDINGS  Left Ventricle: Left ventricular ejection fraction, by estimation, is 50 to 55%. The left ventricle has low normal function. Left ventricular endocardial border not optimally defined to evaluate regional wall motion. The left ventricular internal cavity  size was normal in size. There is borderline left ventricular hypertrophy. Left ventricular diastolic parameters were normal. Right Ventricle: The right ventricular size is mildly enlarged. No increase in right ventricular wall thickness. Right ventricular systolic function is normal. Left Atrium: Left atrial size was normal in size. Right Atrium: Right atrial size was mildly dilated. Pericardium: There is no evidence of pericardial effusion. Mitral  Valve: The mitral valve was not well visualized. Trivial mitral valve regurgitation. Tricuspid Valve: The tricuspid valve is not well visualized. Tricuspid valve regurgitation is mild. Aortic Valve: The aortic valve is grossly normal. Aortic valve regurgitation is not visualized. Pulmonic Valve: The pulmonic valve was not well visualized. Pulmonic valve regurgitation is trivial. Aorta: The aortic root was not well visualized. IAS/Shunts: The interatrial septum was not assessed.  LEFT VENTRICLE PLAX 2D LVIDd:         3.64 cm LVIDs:         3.02 cm LV PW:         0.69 cm LV IVS:        0.63 cm  IVC IVC diam: 1.46 cm LEFT ATRIUM           Index       RIGHT ATRIUM           Index LA diam:      3.00 cm 2.08 cm/m  RA Area:     10.50 cm LA Vol (A4C): 74.9 ml 51.87 ml/m RA Volume:   20.90 ml  14.47 ml/m   AORTA Ao Root diam: 3.10 cm MITRAL VALVE MV Area (PHT): 6.83 cm MV Decel Time: 111 msec MV E velocity: 110.00 cm/s MV A velocity: 92.70 cm/s MV E/A ratio:  1.19 Bartholome Bill MD Electronically signed by Bartholome Bill MD Signature Date/Time: 04/01/2020/8:40:31 AM    Final    @  PROBHOSP@ EEG  Result Date: 04/01/2020 Lora Havens, MD     04/01/2020  5:04 PM Patient Name: MARIANN PALO MRN: [id number] Epilepsy Attending: Lora Havens Referring Physician/Provider: Dr Karena Addison Aroor Date: 04/01/2020 Duration: 23.12 mins Patient history: 64 year old female presents with hyponatremia secondary to SIADH, concerns for Flexeril overdose. Remains obtunded despite correction of sodium neurology consulted.  EEG to evaluate for seizure. Level of alertness: Awake AEDs during EEG study: None Technical aspects: This EEG study was done with scalp electrodes positioned according to the 10-20 International system of electrode placement. Electrical activity was acquired at a sampling rate of 500Hz  and reviewed with a high frequency filter of 70Hz  and a low frequency filter of 1Hz . EEG data were recorded continuously and digitally  stored. Description: The posterior dominant rhythm consists of 8 Hz activity of moderate voltage (25-35 uV) seen predominantly in posterior head regions, symmetric and reactive to eye opening and eye closing. EEG showed intermittent generalized 3 to 5 Hz theta-delta slowing.  Hyperventilation and photic stimulation were not performed.   ABNORMALITY -Intermittent slow, generalized IMPRESSION: This study is suggestive of mild diffuse encephalopathy, nonspecific etiology. No seizures or epileptiform discharges were seen throughout the recording. 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  - TSH-wnl  - Tylenol, ETOH, ASA all normal - no hx of CHF or liver dz  - UA suggestive of absence of infection  -monitor Na q4h   -blood cultures ordered - patient placed on unasyn empirically   -TCA is elevated on UDS- psychatric therapy related hyponatremia and AMS   Advanced centrilobular emphysema with COPD  typical COPD carepath  -will d/c steroids as there are no signs of AECOPD at this time - will obtain procalcitonin to guide antimicrobials - MRSA pcr  - RVP for viral LRTI rule out   ID -continue IV abx as prescibed -follow up cultures  GI/Nutrition GI PROPHYLAXIS as indicated DIET-->TF's as tolerated Constipation protocol as indicated  ENDO - ICU hypoglycemic\Hyperglycemia protocol -check FSBS per  protocol   ELECTROLYTES -follow labs as needed -replace as needed -pharmacy consultation   DVT/GI PRX ordered -SCDs  TRANSFUSIONS AS NEEDED MONITOR FSBS ASSESS the need for LABS as needed   Critical care provider statement:    Critical care time (minutes):  34   Critical care time was exclusive of:  Separately billable procedures and treating other patients   Critical care was necessary to treat or prevent imminent or life-threatening deterioration of the following conditions:  Altered mental status with lethargy, severe hyponatremia, COPD with emphysema, multiple comorbid conditions   Critical care was time spent personally by me on the following activities:  Development of treatment plan with patient or surrogate, discussions with consultants, evaluation of patient's response to treatment, examination of patient, obtaining history from patient or surrogate, ordering and  performing treatments and interventions, ordering and review of laboratory studies and re-evaluation of patient's condition.  I assumed direction of critical care for this patient from another provider in my specialty: no    This document was prepared using Dragon voice recognition software and may include unintentional dictation errors.    Ottie Glazier, M.D.  Division of [facility]

## 2020-04-02 NOTE — Progress Notes (Signed)
Speech Language Pathology Treatment: Dysphagia  Patient Details Name: Wendy Ferrell MRN: [id number] DOB: 07/05/1956 Today's Date: 04/02/2020 Time: 1779-3903 SLP Time Calculation (min) (ACUTE ONLY): 55 min  Assessment / Plan / Recommendation Clinical Impression  Pt seen for ongoing assessment of swallowing; trials of po's(foods/liquids) d/t pt's ongoing NPO status secondary to her declined mental/Cognitive status. Noted MD notes indicating Acute metabolic Encephalopathy w/ improving Hyponatremia . Family present in room including Daughter. Pt appears significantly impacted by her COPD and Pulmonary decline w/ need for increased O2 support(2-3L) to ease Pulmonary demand. Discussed w/ family present and pt that ANY significant Pulmonary decline such as hers can impact Apnea timing during the swallow which can impact pharyngeal swallowing, airway protection, and increase risk for aspiration to occur thus Pulmonary impact/decline. Pt/family stated understanding.  Pt endorsed she felt "much better today" and was able to converse in sentences/brief conversation w/ her family and NSG/MD. She indicated to SLP that she was "hungry". Pt was supported in her positioning min more forward, then she consumed a few sips of water via Straw (as she had been doing already in bed post recent po diet order placed by NSG). No immediate, overt s/s of aspiration noted -- no wet vocal quality or declined O2 sats. Pt exhibited a dry, delayed cough, however, pt has a Baseline, dry cough intermittently at rest as well. This cough was not recurrent and did not increase in frequency as po's continued. Pt's respiratory effort did increase w/ the effort from the exertion of the task of eating/drinking (talking included) but calmed again somewhat w/ frequent Rest Breaks b/t trials. Swallowing of liquids was timely. Pt was encouraged to use a Cup for drinking w/ Small, Single sips Slowly to lessen risk for aspiration d/t Pulmonary  decline. Pt fed self sitting fully upright in bed.  Discussed that foods of increased texture would require more oral phase time, gumming effort, and increased respiratory effort. Education given on soft food consistencies/options and need for cohesive foods; preparation. Educated pt and family on general aspiration precautions d/t her declined Pulmonary status - the need for frequent Rest Breaks during oral intake; 1 bite or sip at a time. Recommend a more Minced, chopped foods diet w/ moistened foods for easier mastication/gumming to lessen WOB d/t effort, and d/t lacking Dentition. Recommend thin liquids but Pills given in Puree for cohesiveness to lessen risk for aspiration d/t Pulmonary decline. GERD/REFLUX precautions. ST services can be available for further education while admitted; NSG to reconsult. A Dietician consult and f/u could be beneficial as well. NSG updated.      HPI HPI: Pt is a 64 yo w/ h/o HTN, COPD, PVD who presented via the [facility] ED to the ICU for altered mental status. The patient was transported via EMS after having been called by the patient's husband. Upon squad arrival, they noted pt to be somnolent with two unidentified tablets nearby. Narcan was administered with little benefit. Oxygen was administered as her initial saturations were in the mid-80's. The patient is known to have COPD, there is some question as to her potential home oxygen need at baseline. There are no benzos or narcotics prescribed to her, and a UDS was notable for tricyclics only, however she does take flexeril. Initial lab evaluation revealed a metabolic disarray with low sodium of 114 and hypokalemia. No diuretic medication was noted on her list and her ethanol level was <10. A presenting blood gas was not available.  CXR was without much acute finding.  SLP Plan  All goals met       Recommendations  Diet recommendations: Dysphagia 3 (mechanical soft);Thin liquid Liquids provided via: Cup;Straw  (monitor) Medication Administration: Whole meds with puree (for safer swallowing overall d/t Pulmonary decline) Supervision: Patient able to self feed;Intermittent supervision to cue for compensatory strategies (setup support) Compensations: Minimize environmental distractions;Slow rate;Small sips/bites;Lingual sweep for clearance of pocketing;Multiple dry swallows after each bite/sip;Follow solids with liquid (REST BREAKS) Postural Changes and/or Swallow Maneuvers: Seated upright 90 degrees;Upright 30-60 min after meal                General recommendations:  (Dietician f/u) Oral Care Recommendations: Oral care BID;Oral care before and after PO;Patient independent with oral care;Staff/trained caregiver to provide oral care Follow up Recommendations: None SLP Visit Diagnosis: Dysphagia, unspecified (R13.10) (missing Dentition) Plan: All goals met       GO                 Orinda Kenner, MS, CCC-SLP Sirron Francesconi 04/02/2020, 1:12 PM

## 2020-04-02 NOTE — Progress Notes (Signed)
Patient is AOx4 today.  She is joined by her daughter and her boyfriend at the bedside.  Patient vitals are WDL.  Patient has consulted with hospitalist and neurology.  She is sipping water with no signs of aspiration.  Ordered a regular diet per physician.

## 2020-04-02 NOTE — Progress Notes (Signed)
Patient has improved tremendously this shift.  She had become alert and awake and oriented to all faculties.  Patient family has spent majority of the day by her side.  She consumes her meals well and eats without assistance needed.  Her rhythm is NSR but she has periods of PAC and PVC's that physician is aware.  Her breath sounds are still wheezy on expiration.  Pulses are good.  No events to note.

## 2020-04-02 NOTE — Progress Notes (Signed)
Reason for consult: Obtunded state  Subjective: Patient is awake and alert, oriented x3.  Denies overdosing on Flexeril.  States she has good days and bad days, and admits to drinking a lot of water.   ROS: negative except above   Examination  Vital signs in last 24 hours: Temp:  [97.6 F (36.4 C)-98.8 F (37.1 C)] 97.9 F (36.6 C) (07/24 1200) Pulse Rate:  [89-113] 105 (07/24 1400) Resp:  [18-28] 27 (07/24 1400) BP: (125-157)/(62-118) 125/71 (07/24 1400) SpO2:  [95 %-100 %] 100 % (07/24 1400)  General: lying in bed CVS: pulse-normal rate and rhythm RS: breathing comfortably Extremities: normal   Neuro: MS: Alert, oriented, follows commands CN: pupils equal and reactive,  EOMI, face symmetric, tongue midline, normal sensation over face, Motor: 5/5 strength in all 4 extremities Reflexes: 2+ bilaterally over patella, biceps, plantars: flexor Coordination: normal Gait: not tested  Basic Metabolic Panel: Recent Labs  Lab 03/31/20 0913 03/31/20 0913 03/31/20 1245 03/31/20 1245 03/31/20 1713 03/31/20 1713 03/31/20 2208 03/31/20 2208 04/01/20 0349 04/01/20 0947 04/02/20 0443  NA 125*   < > 126*   < > 127*  --  129*  --  134* 132* 136  K 4.0   < > 4.1   < > 4.0  --  4.2  --  4.4 4.5 4.5  CL 83*   < > 84*   < > 90*  --  88*  --  88* 89* 90*  CO2 30   < > 28   < > 28  --  30  --  27 32 36*  GLUCOSE 148*   < > 148*   < > 147*  --  141*  --  151* 132* 126*  BUN 5*   < > 6*   < > 8  --  9  --  11 13 18   CREATININE 0.65   < > 0.67   < > 0.69  --  0.75  --  0.95 0.78 0.84  CALCIUM 8.6*   < > 8.4*   < > 8.6*   < > 8.6*   < > 9.0 8.5* 8.6*  MG 2.3  --  2.2  --  2.2  --  2.1  --   --   --  2.3  PHOS  --   --   --   --   --   --   --   --   --   --  4.0   < > = values in this interval not displayed.    CBC: Recent Labs  Lab 03/30/20 1925 03/30/20 2110 03/31/20 0114 04/02/20 0443  WBC 6.2 6.2 5.5 12.3*  NEUTROABS 3.9 3.7  --  11.4*  HGB RESULTS UNAVAILABLE DUE TO  INTERFERING SUBSTANCE 13.1 13.0 12.5  HCT RESULTS UNAVAILABLE DUE TO INTERFERING SUBSTANCE RESULTS UNAVAILABLE DUE TO INTERFERING SUBSTANCE RESULTS UNAVAILABLE DUE TO INTERFERING SUBSTANCE 36.0  MCV RESULTS UNAVAILABLE DUE TO INTERFERING SUBSTANCE RESULTS UNAVAILABLE DUE TO INTERFERING SUBSTANCE RESULTS UNAVAILABLE DUE TO INTERFERING SUBSTANCE 98.6  PLT 230 222 214 248     Coagulation Studies: No results for input(s): LABPROT, INR in the last 72 hours.  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 Neurology consulted as patient obtunded despite correction of sodium neurology consulted.  Low suspicion for osmotic pontine demyelination as patient does withdraw briskly to noxious stimulus, and no changes on MRI  brain.  On exam, patient does forcefully close her eyes, appears to be awake.  EEG was obtained which did not show any epileptiform discharges. Patient not febrile, no increased tone or hyperreflexia, no tachycardia -unlikely to be serotonin syndrome.   Patient improved without any specific treatment and is back to baseline today.  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 Flexeril overdose on presentation    Recommendations Consider outpatient psychiatric consult Maintains sodium in the 130 -135 range, avoid rapid fluctuations Frequent neurochecks every 4 hours  Nya Monds [facility] Pager Number [phone] For questions after 7pm please refer to AMION to reach the Neurologist on call

## 2020-04-03 LAB — CBC WITH DIFFERENTIAL/PLATELET
Abs Immature Granulocytes: 0.07 10*3/uL (ref 0.00–0.07)
Basophils Absolute: 0 10*3/uL (ref 0.0–0.1)
Basophils Relative: 0 %
Eosinophils Absolute: 0 10*3/uL (ref 0.0–0.5)
Eosinophils Relative: 0 %
HCT: 37.5 % (ref 36.0–46.0)
Hemoglobin: 12.8 g/dL (ref 12.0–15.0)
Immature Granulocytes: 1 %
Lymphocytes Relative: 2 %
Lymphs Abs: 0.3 10*3/uL — ABNORMAL LOW (ref 0.7–4.0)
MCH: 34.2 pg — ABNORMAL HIGH (ref 26.0–34.0)
MCHC: 34.1 g/dL (ref 30.0–36.0)
MCV: 100.3 fL — ABNORMAL HIGH (ref 80.0–100.0)
Monocytes Absolute: 0.3 10*3/uL (ref 0.1–1.0)
Monocytes Relative: 2 %
Neutro Abs: 12 10*3/uL — ABNORMAL HIGH (ref 1.7–7.7)
Neutrophils Relative %: 95 %
Platelets: 227 10*3/uL (ref 150–400)
RBC: 3.74 MIL/uL — ABNORMAL LOW (ref 3.87–5.11)
RDW: 13.7 % (ref 11.5–15.5)
WBC: 12.7 10*3/uL — ABNORMAL HIGH (ref 4.0–10.5)
nRBC: 0 % (ref 0.0–0.2)

## 2020-04-03 LAB — BASIC METABOLIC PANEL
Anion gap: 8 (ref 5–15)
BUN: 22 mg/dL (ref 8–23)
CO2: 36 mmol/L — ABNORMAL HIGH (ref 22–32)
Calcium: 8.5 mg/dL — ABNORMAL LOW (ref 8.9–10.3)
Chloride: 90 mmol/L — ABNORMAL LOW (ref 98–111)
Creatinine, Ser: 0.79 mg/dL (ref 0.44–1.00)
GFR calc Af Amer: >60 mL/min (ref >60)
GFR calc non Af Amer: >60 mL/min (ref >60)
Glucose, Bld: 140 mg/dL — ABNORMAL HIGH (ref 70–99)
Potassium: 4.9 mmol/L (ref 3.5–5.1)
Sodium: 134 mmol/L — ABNORMAL LOW (ref 135–145)

## 2020-04-03 LAB — MAGNESIUM: Magnesium: 2.3 mg/dL (ref 1.7–2.4)

## 2020-04-03 LAB — PHOSPHORUS: Phosphorus: 4 mg/dL (ref 2.5–4.6)

## 2020-04-03 MED ORDER — FLUTICASONE FUROATE-VILANTEROL 100-25 MCG/INH IN AEPB
1.0000 | INHALATION_SPRAY | Freq: Every day | RESPIRATORY_TRACT | Status: DC
  Administered 2020-04-03 – 2020-04-04 (×2): 1 via RESPIRATORY_TRACT
  Filled 2020-04-03: qty 28

## 2020-04-03 MED ORDER — UMECLIDINIUM BROMIDE 62.5 MCG/INH IN AEPB
1.0000 | INHALATION_SPRAY | Freq: Every day | RESPIRATORY_TRACT | Status: DC
  Administered 2020-04-03 – 2020-04-04 (×2): 1 via RESPIRATORY_TRACT
  Filled 2020-04-03: qty 7

## 2020-04-03 NOTE — Progress Notes (Signed)
Had a very good night, remained awake and alert all night, able to sleep tonight, but still easily arousable. Maintaining O2 sat on 2 lpm N/C. VSS, will continue to monitor.

## 2020-04-03 NOTE — Progress Notes (Signed)
Pt has remained alert and oriented throughout shift. Vitals WNL. Family at bedside this am. Pt resting comfortably waiting for a bed on the floor. Will continue to monitor

## 2020-04-03 NOTE — Progress Notes (Signed)
PHARMACY CONSULT NOTE - FOLLOW UP  Pharmacy Consult for Electrolyte Monitoring and Replacement   Recent Labs: Potassium (mmol/L)  Date Value  04/03/2020 4.9   Magnesium (mg/dL)  Date Value  04/03/2020 2.3   Calcium (mg/dL)  Date Value  04/03/2020 8.5 (L)   Albumin (g/dL)  Date Value  03/30/2020 3.6   Phosphorus (mg/dL)  Date Value  04/03/2020 4.0   Sodium (mmol/L)  Date Value  04/03/2020 134 (L)   Assessment: Patient admitted to ICU with hyponatremia. Initially started on hypertonic saline which was then changed to NS and then LR due to rapid correction of sodium. Currently NPO in setting of AMS.  Goal of Therapy:  Lytes WNL, K+ >/= 4  Plan:  Electrolytes WNL.   K creeping up.  No supplementation at this time. Dysphagia 3 diet started today 7/24. Will follow up all electrolytes with morning labs.  Noralee Space ,PharmD Clinical Pharmacist 04/03/2020 11:42 AM

## 2020-04-03 NOTE — Progress Notes (Signed)
CRITICAL CARE PROGRESS NOTE    Name: Wendy Ferrell MRN: [id number] DOB: [birth year]     LOS: 4   SUBJECTIVE FINDINGS & SIGNIFICANT EVENTS    Patient description:  Wendy Ferrell is a 64 yo WF who presented via the [facility] ED to the ICU for altered mental status. The patient was transported via EMS after having been called by the patient's husband. Upon squad arrival, they noted Wendy Ferrell to be somnolent with two unidentified tablets nearby. Narcan was administered with little benefit. Oxygen was administered as her initial saturations were in the mid-80's. The patient is known to have COPD, there is some question as to her potential home oxygen need at baseline. There are no benzos or narcotics prescribed to her, and a UDS was notable for tricyclics only, however she does take flexeril. Initial lab evaluation revealed a metabolic disarray with low sodium of 114 and hypokalemia. No diuretic medication was noted on her list and her ethanol level was <10. A presenting blood gas was not available, however a CXR was without much acute finding. She was wheezing and rhonchorous on her admission to the ICU   04/01/20- Discussed hospital course with neurologist today, there is plan for EEG and additional ancillary testing.  04/02/20- patient clinically improved, shes able to speak and recall from memory.  04/03/20- patient is able to eat diet , optimizing for TRH transfer - Dr Kurtis Bushman   Lines/tubes : External Urinary Catheter (Active)  Collection Container Dedicated Suction Canister 03/31/20 0803  Securement Method Leg strap 03/31/20 0803  Site Assessment Intact;Clean 03/31/20 0803  Output (mL) 500 mL 03/31/20 0651    Microbiology/Sepsis markers: Results for orders placed or performed during the hospital encounter of 03/30/20  MRSA  PCR Screening     Status: None   Collection Time: 03/30/20 11:31 PM   Specimen: Nasopharyngeal  Result Value Ref Range Status   MRSA by PCR NEGATIVE NEGATIVE Final    Comment:        The GeneXpert MRSA Assay (FDA approved for NASAL specimens only), is one component of a comprehensive MRSA colonization surveillance program. It is not intended to diagnose MRSA infection nor to guide or monitor treatment for MRSA infections. Performed at [facility], [address], [place], [place] [address]     Anti-infectives:  Anti-infectives (From admission, onward)   Start     Dose/Rate Route Frequency Ordered Stop   03/31/20 0000  Ampicillin-Sulbactam (UNASYN) 3 g in sodium chloride 0.9 % 100 mL IVPB     Discontinue     3 g 200 mL/hr over 30 Minutes Intravenous Every 6 hours 03/30/20 2347          PAST MEDICAL HISTORY   Past Medical History:  Diagnosis Date  . COPD (chronic obstructive pulmonary disease) ([place])   . Hypertension   . Nicotine addiction   . Peripheral vascular disease ([place])      SURGICAL HISTORY   Past Surgical History:  Procedure Laterality Date  . ABDOMINAL HYSTERECTOMY    . COLONOSCOPY WITH PROPOFOL N/A 02/21/2016   Procedure: COLONOSCOPY WITH PROPOFOL;  Surgeon: Lollie Sails, MD;  Location: [facility] ENDOSCOPY;  Service: Endoscopy;  Laterality: N/A;  . LOWER EXTREMITY ANGIOGRAPHY Right 05/28/2017   Procedure: Lower Extremity Angiography;  Surgeon: Katha Cabal, MD;  Location: [facility] CV LAB;  Service: Cardiovascular;  Laterality: Right;  . MASTOIDECTOMY    . NECK SURGERY    . PERIPHERAL VASCULAR CATHETERIZATION Left 05/29/2016  Procedure: Lower Extremity Angiography;  Surgeon: Katha Cabal, MD;  Location: [facility] LAB;  Service: Cardiovascular;  Laterality: Left;  . PERIPHERAL VASCULAR CATHETERIZATION  05/29/2016   Procedure: Lower Extremity Intervention;  Surgeon: Katha Cabal, MD;  Location: [facility] LAB;   Service: Cardiovascular;;  . TONSILLECTOMY       FAMILY HISTORY   Family History  Problem Relation Age of Onset  . COPD Mother   . Hypertension Mother   . COPD Father   . Breast cancer Maternal Grandmother 60  . Breast cancer Cousin 50       maternal side     SOCIAL HISTORY   Social History   Tobacco Use  . Smoking status: Former Smoker    Packs/day: 0.50    Years: 45.00    Pack years: 22.50    Types: Cigarettes  . Smokeless tobacco: Never Used  Vaping Use  . Vaping Use: Never used  Substance Use Topics  . Alcohol use: No  . Drug use: No     MEDICATIONS   Current Medication:  Current Facility-Administered Medications:  .  albuterol (PROVENTIL) (2.5 MG/3ML) 0.083% nebulizer solution 2.5 mg, 2.5 mg, Nebulization, Q4H PRN, Flora Lipps, MD, 2.5 mg at 04/03/20 0831 .  Ampicillin-Sulbactam (UNASYN) 3 g in sodium chloride 0.9 % 100 mL IVPB, 3 g, Intravenous, Q6H, Nelle Don, MD, Last Rate: 200 mL/hr at 04/03/20 1713, 3 g at 04/03/20 1713 .  budesonide (PULMICORT) nebulizer solution 0.25 mg, 0.25 mg, Nebulization, BID, Nelle Don, MD, 0.25 mg at 04/03/20 0704 .  Chlorhexidine Gluconate Cloth 2 % PADS 6 each, 6 each, Topical, Daily, Nelle Don, MD, 6 each at 04/03/20 1008 .  docusate sodium (COLACE) capsule 100 mg, 100 mg, Oral, BID PRN, Nelle Don, MD .  fluticasone furoate-vilanterol (BREO ELLIPTA) 100-25 MCG/INH 1 puff, 1 puff, Inhalation, Daily, Ottie Glazier, MD, 1 puff at 04/03/20 1008 .  heparin injection 5,000 Units, 5,000 Units, Subcutaneous, Q8H, Nelle Don, MD, 5,000 Units at 04/03/20 1336 .  ipratropium-albuterol (DUONEB) 0.5-2.5 (3) MG/3ML nebulizer solution 3 mL, 3 mL, Nebulization, QID, Kasa, Kurian, MD, 3 mL at 04/03/20 1500 .  methylPREDNISolone sodium succinate (SOLU-MEDROL) 40 mg/mL injection 40 mg, 40 mg, Intravenous, Q8H, Nelle Don, MD, 40 mg at 04/03/20 1205 .  polyethylene glycol (MIRALAX / GLYCOLAX) packet 17 g, 17 g,  Oral, Daily PRN, Nelle Don, MD .  umeclidinium bromide (INCRUSE ELLIPTA) 62.5 MCG/INH 1 puff, 1 puff, Inhalation, Daily, Ottie Glazier, MD, 1 puff at 04/03/20 1008    ALLERGIES   Patient has no known allergies.    REVIEW OF SYSTEMS   Unable to obtain due to altered mental status   PHYSICAL EXAMINATION   Vital Signs: Temp:  [97.9 F (36.6 C)-98.8 F (37.1 C)] 97.9 F (36.6 C) (07/25 1400) Pulse Rate:  [84-118] 114 (07/25 1400) Resp:  [18-27] 21 (07/25 1700) BP: (125-177)/(66-113) 143/75 (07/25 1700) SpO2:  [90 %-100 %] 99 % (07/25 1400) Weight:  [45.2 kg] 45.2 kg (07/25 0500)  GENERAL:appears older then stated age HEAD: Normocephalic, atraumatic.  EYES: Pupils equal, round, reactive to light.  No scleral icterus.  MOUTH: Moist mucosal membrane. NECK: Supple. No thyromegaly. No nodules. No JVD.  PULMONARY: clear to auscultation  CARDIOVASCULAR: S1 and S2. Regular rate and rhythm. No murmurs, rubs, or gallops.  GASTROINTESTINAL: Soft, nontender, non-distended. No masses. Positive bowel sounds. No hepatosplenomegaly.  MUSCULOSKELETAL: No swelling, clubbing, or edema.  NEUROLOGIC: Mild distress due to acute illness, GCS10  SKIN:intact,warm,dry   PERTINENT DATA     Infusions: . ampicillin-sulbactam (UNASYN) IV 3 g (04/03/20 1713)   Scheduled Medications: . budesonide (PULMICORT) nebulizer solution  0.25 mg Nebulization BID  . Chlorhexidine Gluconate Cloth  6 each Topical Daily  . fluticasone furoate-vilanterol  1 puff Inhalation Daily  . heparin  5,000 Units Subcutaneous Q8H  . ipratropium-albuterol  3 mL Nebulization QID  . methylPREDNISolone (SOLU-MEDROL) injection  40 mg Intravenous Q8H  . umeclidinium bromide  1 puff Inhalation Daily   PRN Medications: albuterol, docusate sodium, polyethylene glycol Hemodynamic parameters:   Intake/Output: 07/24 0701 - 07/25 0700 In: 600 [P.O.:600] Out: 750 [Urine:750]  Ventilator  Settings:     LAB  RESULTS:  Basic Metabolic Panel: Recent Labs  Lab 03/31/20 1245 03/31/20 1245 03/31/20 1713 03/31/20 1713 03/31/20 2208 03/31/20 2208 04/01/20 0349 04/01/20 0349 04/01/20 0947 04/01/20 0947 04/02/20 0443 04/03/20 0533  NA 126*   < > 127*   < > 129*  --  134*  --  132*  --  136 134*  K 4.1   < > 4.0   < > 4.2   < > 4.4   < > 4.5   < > 4.5 4.9  CL 84*   < > 90*   < > 88*  --  88*  --  89*  --  90* 90*  CO2 28   < > 28   < > 30  --  27  --  32  --  36* 36*  GLUCOSE 148*   < > 147*   < > 141*  --  151*  --  132*  --  126* 140*  BUN 6*   < > 8   < > 9  --  11  --  13  --  18 22  CREATININE 0.67   < > 0.69   < > 0.75  --  0.95  --  0.78  --  0.84 0.79  CALCIUM 8.4*   < > 8.6*   < > 8.6*  --  9.0  --  8.5*  --  8.6* 8.5*  MG 2.2  --  2.2  --  2.1  --   --   --   --   --  2.3 2.3  PHOS  --   --   --   --   --   --   --   --   --   --  4.0 4.0   < > = values in this interval not displayed.   Liver Function Tests: Recent Labs  Lab 03/30/20 1925  AST 28  ALT 16  ALKPHOS 63  BILITOT 1.3*  PROT 5.8*  ALBUMIN 3.6   No results for input(s): LIPASE, AMYLASE in the last 168 hours. Recent Labs  Lab 03/31/20 0114  AMMONIA 17   CBC: Recent Labs  Lab 03/30/20 1925 03/30/20 2110 03/31/20 0114 04/02/20 0443 04/03/20 0533  WBC 6.2 6.2 5.5 12.3* 12.7*  NEUTROABS 3.9 3.7  --  11.4* 12.0*  HGB RESULTS UNAVAILABLE DUE TO INTERFERING SUBSTANCE 13.1 13.0 12.5 12.8  HCT RESULTS UNAVAILABLE DUE TO INTERFERING SUBSTANCE RESULTS UNAVAILABLE DUE TO INTERFERING SUBSTANCE RESULTS UNAVAILABLE DUE TO INTERFERING SUBSTANCE 36.0 37.5  MCV RESULTS UNAVAILABLE DUE TO INTERFERING SUBSTANCE RESULTS UNAVAILABLE DUE TO INTERFERING SUBSTANCE RESULTS UNAVAILABLE DUE TO INTERFERING SUBSTANCE 98.6 100.3*  PLT 230 222 214 248 227   Cardiac Enzymes: No results for input(s): CKTOTAL, CKMB, CKMBINDEX, TROPONINI in the last  168 hours. BNP: Invalid input(s): POCBNP CBG: Recent Labs  Lab 03/30/20 2316   GLUCAP 93       IMAGING RESULTS:  Imaging: No results found. @PROBHOSP @ No results found.         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  - TSH-wnl  - Tylenol, ETOH, ASA all normal - no hx of CHF or liver dz  - UA suggestive of absence of infection  -monitor Na q4h   -blood cultures ordered - patient placed on unasyn empirically   -TCA is elevated on UDS- psychatric therapy related hyponatremia and AMS   Advanced centrilobular emphysema with COPD  typical COPD carepath  -will d/c steroids as there are no signs of AECOPD at this time - will obtain procalcitonin to guide antimicrobials - MRSA pcr  - RVP for viral LRTI rule out   ID -continue IV abx as prescibed -follow up cultures  GI/Nutrition GI PROPHYLAXIS as indicated DIET-->TF's as tolerated Constipation protocol as indicated  ENDO - ICU hypoglycemic\Hyperglycemia protocol -check FSBS per protocol   ELECTROLYTES -follow labs as needed -replace as needed -pharmacy consultation   DVT/GI PRX ordered -SCDs  TRANSFUSIONS AS NEEDED MONITOR FSBS ASSESS the need for LABS as needed   Critical care provider statement:    Critical care time (minutes):  34   Critical care time was exclusive of:  Separately billable procedures and treating other patients   Critical care was necessary to treat or prevent imminent or life-threatening deterioration of the following  conditions:  Altered mental status with lethargy, severe hyponatremia, COPD with emphysema, multiple comorbid conditions   Critical care was time spent personally by me on the following activities:  Development of treatment plan with patient or surrogate, discussions with consultants, evaluation of patient's response to treatment, examination of patient, obtaining history from patient or surrogate, ordering and performing treatments and interventions, ordering and review of laboratory studies and re-evaluation of patient's condition.  I assumed direction of critical care for this patient from another provider in my specialty: no    This document was prepared using Dragon voice recognition software and may include unintentional dictation errors.    Ottie Glazier, M.D.  Division of [place]

## 2020-04-03 NOTE — Progress Notes (Addendum)
Patient has not been tested for Covid this admit, NP notified. See new order.

## 2020-04-04 DIAGNOSIS — J449 Chronic obstructive pulmonary disease, unspecified: Secondary | ICD-10-CM

## 2020-04-04 DIAGNOSIS — E871 Hypo-osmolality and hyponatremia: Secondary | ICD-10-CM | POA: Diagnosis not present

## 2020-04-04 LAB — CBC WITH DIFFERENTIAL/PLATELET
Abs Immature Granulocytes: 0.1 10*3/uL — ABNORMAL HIGH (ref 0.00–0.07)
Basophils Absolute: 0 10*3/uL (ref 0.0–0.1)
Basophils Relative: 0 %
Eosinophils Absolute: 0 10*3/uL (ref 0.0–0.5)
Eosinophils Relative: 0 %
HCT: 37.9 % (ref 36.0–46.0)
Hemoglobin: 12.7 g/dL (ref 12.0–15.0)
Immature Granulocytes: 1 %
Lymphocytes Relative: 5 %
Lymphs Abs: 0.6 10*3/uL — ABNORMAL LOW (ref 0.7–4.0)
MCH: 33.6 pg (ref 26.0–34.0)
MCHC: 33.5 g/dL (ref 30.0–36.0)
MCV: 100.3 fL — ABNORMAL HIGH (ref 80.0–100.0)
Monocytes Absolute: 0.4 10*3/uL (ref 0.1–1.0)
Monocytes Relative: 3 %
Neutro Abs: 11.3 10*3/uL — ABNORMAL HIGH (ref 1.7–7.7)
Neutrophils Relative %: 91 %
Platelets: 216 10*3/uL (ref 150–400)
RBC: 3.78 MIL/uL — ABNORMAL LOW (ref 3.87–5.11)
RDW: 13.2 % (ref 11.5–15.5)
WBC: 12.5 10*3/uL — ABNORMAL HIGH (ref 4.0–10.5)
nRBC: 0 % (ref 0.0–0.2)

## 2020-04-04 LAB — BASIC METABOLIC PANEL
Anion gap: 9 (ref 5–15)
BUN: 17 mg/dL (ref 8–23)
CO2: 37 mmol/L — ABNORMAL HIGH (ref 22–32)
Calcium: 7.8 mg/dL — ABNORMAL LOW (ref 8.9–10.3)
Chloride: 87 mmol/L — ABNORMAL LOW (ref 98–111)
Creatinine, Ser: 0.64 mg/dL (ref 0.44–1.00)
GFR calc Af Amer: >60 mL/min (ref >60)
GFR calc non Af Amer: >60 mL/min (ref >60)
Glucose, Bld: 124 mg/dL — ABNORMAL HIGH (ref 70–99)
Potassium: 4.9 mmol/L (ref 3.5–5.1)
Sodium: 133 mmol/L — ABNORMAL LOW (ref 135–145)

## 2020-04-04 LAB — SAR COV2 SEROLOGY (COVID19)AB(IGG),IA: SARS-CoV-2 Ab, IgG: NONREACTIVE

## 2020-04-04 LAB — MAGNESIUM: Magnesium: 2.1 mg/dL (ref 1.7–2.4)

## 2020-04-04 LAB — PHOSPHORUS: Phosphorus: 3.5 mg/dL (ref 2.5–4.6)

## 2020-04-04 LAB — SARS CORONAVIRUS 2 BY RT PCR (HOSPITAL ORDER, PERFORMED IN ~~LOC~~ HOSPITAL LAB): SARS Coronavirus 2: NEGATIVE

## 2020-04-04 MED ORDER — AMLODIPINE BESYLATE 10 MG PO TABS: 10.0000 mg | ORAL_TABLET | Freq: Every day | ORAL | 0 refills | Status: DC

## 2020-04-04 MED ORDER — AMOXICILLIN-POT CLAVULANATE 875-125 MG PO TABS: 1.0000 | ORAL_TABLET | Freq: Two times a day (BID) | ORAL | 0 refills | Status: AC

## 2020-04-04 MED ORDER — AMOXICILLIN-POT CLAVULANATE 875-125 MG PO TABS
1.0000 | ORAL_TABLET | Freq: Two times a day (BID) | ORAL | Status: DC
  Administered 2020-04-04: 1 via ORAL
  Filled 2020-04-04: qty 1

## 2020-04-04 MED ORDER — LACTINEX PO CHEW
1.0000 | CHEWABLE_TABLET | Freq: Three times a day (TID) | ORAL | 0 refills | Status: AC
Start: 2020-04-04 — End: 2020-04-11

## 2020-04-04 MED ORDER — AMLODIPINE BESYLATE 5 MG PO TABS
5.0000 mg | ORAL_TABLET | Freq: Every day | ORAL | Status: DC
  Administered 2020-04-04: 10:00:00 5 mg via ORAL
  Filled 2020-04-04: qty 1

## 2020-04-04 NOTE — Discharge Summary (Signed)
AZALYN SLIWA ZCH:[id number] DOB: March 30, 1956 DOA: 03/30/2020  PCP: Jodi Marble, MD  Admit date: 03/30/2020 Discharge date: 04/04/2020  Admitted From: home Disposition:  home  Recommendations for Outpatient Follow-up:  1. Follow up with PCP in 1 week 2. Please obtain BMP/CBC in one week 3. Limited fluid intake to 1200-1597ml per day  Home Health:none    Discharge Condition:Stable CODE STATUS:full  Diet recommendation: dysphagia 3 Brief/Interim Summary: Lesleyann Fichter is a 64 yo WF who presented via the [facility] ED to the ICU for altered mental status. The patient was transported via EMS after having been called by the patient's husband. Upon squad arrival, they noted Ms. Gradillas to be somnolent with two unidentified tablets nearby. Narcan was administered with little benefit. Oxygen was administered as her initial saturations were in the mid-80's. The patient is known to have COPD, there is some question as to her potential home oxygen need at baseline. There are no benzos or narcotics prescribed to her, and a UDS was notable for tricyclics only, however she does take flexeril. Initial lab evaluation revealed a metabolic disarray with low sodium of 114 and hypokalemia. No diuretic medication was noted on her list and her ethanol level was <10. A presenting blood gas was not available, however a CXR was without much acute finding.  Patient was admitted to ICU.  Neurology was consulted.  PCCM started patient for hyponatremia which was because of her altered mental status.  Her Flexeril was discontinued.  Acute mental status with lethargy   - likely due to hyponatremia-resolved now               -s/p 3NS >>salt tabs with NS>>LR only >> no fluids 7/22 -etiology of hyponatremia is under investigation -possibly due to Flexeril overdose on presentation?  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 MRI of the brain showed normal MRI.  Bilateral  mastoid fluid.  Nothing acute Her mentation improved.  She is able to speak, oriented x3 today. 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  - TSH-wnl  - Tylenol, ETOH, ASA all normal - no hx of CHF or liver dz  - UA suggestive of absence of infection Improved   -blood cultures ordered - patient placed on unasyn empirically   -TCA is elevated on UDS- psychatric therapy related hyponatremia and AMS   Advanced centrilobular emphysema with COPD Was on IV steroids but was discontinued as there are no signs of AECOPD at this time  MRSA pcr RVP for viral LRTI rule out      Discharge Diagnoses:  Active Problems:   Hyponatremia    Discharge Instructions  Discharge Instructions    Call MD for:  persistant nausea and vomiting   Complete by: As directed    Diet - low sodium heart healthy   Complete by: As directed    Discharge instructions   Complete by: As directed    Follow-up with PCP in 1 week.  Stop taking losartan.  Pick up your new medications at pharmacy.need blood work   Increase activity slowly   Complete by: As directed      Allergies as of 04/04/2020   No Known Allergies  Medication List    STOP taking these medications   ibuprofen 200 MG tablet Commonly known as: ADVIL   losartan 25 MG tablet Commonly known as: COZAAR     TAKE these medications   amLODipine 10 MG tablet Commonly known as: NORVASC Take 1 tablet (10 mg total) by mouth daily. Start taking on: April 05, 2020   amoxicillin-clavulanate 875-125 MG tablet Commonly known as: AUGMENTIN Take 1 tablet by mouth every 12 (twelve) hours for 4 days.   aspirin 81 MG chewable tablet Chew by mouth daily.   atorvastatin 40 MG tablet Commonly  known as: LIPITOR Take 40 mg by mouth daily.   clopidogrel 75 MG tablet Commonly known as: PLAVIX TAKE 1 TABLET BY MOUTH EVERY DAY   Combivent Respimat 20-100 MCG/ACT Aers respimat Generic drug: Ipratropium-Albuterol Inhale 1 puff into the lungs 3 (three) times daily.   lactobacillus acidophilus & bulgar chewable tablet Chew 1 tablet by mouth 3 (three) times daily with meals for 7 days.   Trelegy Ellipta 100-62.5-25 MCG/INH Aepb Generic drug: Fluticasone-Umeclidin-Vilant INHALE 1 INHALTION DAILY       Follow-up Information    Jodi Marble, MD Follow up in 1 week(s).   Specialty: Internal Medicine Contact information: [address] [place] [address] [phone]              No Known Allergies  Consultations:  PCCM, neurology   Procedures/Studies: EEG  Result Date: 04/01/2020 Lora Havens, MD     04/01/2020  5:04 PM Patient Name: MELESSIA KAUS MRN: [id number] Epilepsy Attending: Lora Havens Referring Physician/Provider: Dr Karena Addison Aroor Date: 04/01/2020 Duration: 23.12 mins Patient history: 64 year old female presents with hyponatremia secondary to SIADH, concerns for Flexeril overdose. Remains obtunded despite correction of sodium neurology consulted.  EEG to evaluate for seizure. Level of alertness: Awake AEDs during EEG study: None Technical aspects: This EEG study was done with scalp electrodes positioned according to the 10-20 International system of electrode placement. Electrical activity was acquired at a sampling rate of 500Hz  and reviewed with a high frequency filter of 70Hz  and a low frequency filter of 1Hz . EEG data were recorded continuously and digitally stored. Description: The posterior dominant rhythm consists of 8 Hz activity of moderate voltage (25-35 uV) seen predominantly in posterior head regions, symmetric and reactive to eye opening and eye closing. EEG showed intermittent generalized 3 to 5 Hz theta-delta slowing.   Hyperventilation and photic stimulation were not performed.   ABNORMALITY -Intermittent slow, generalized IMPRESSION: This study is suggestive of mild diffuse encephalopathy, nonspecific etiology. No seizures or epileptiform discharges were seen throughout the recording. Lora Havens   CT Head Wo Contrast  Result Date: 03/30/2020 CLINICAL DATA:  Unresponsive possible overdose EXAM: CT HEAD WITHOUT CONTRAST TECHNIQUE: Contiguous axial images were obtained from the base of the skull through the vertex without intravenous contrast. COMPARISON:  MRI 08/09/2016, CT brain 08/09/2016 FINDINGS: Brain: No evidence of acute infarction, hemorrhage, hydrocephalus, extra-axial collection or mass lesion/mass effect. Vascular: No hyperdense vessel.  Carotid vascular calcification. Skull: Normal. Negative for fracture or focal lesion. Sinuses/Orbits: No acute finding. Other: None IMPRESSION: Negative non contrasted CT appearance of the brain. Electronically Signed   By: Donavan Foil M.D.   On: 03/30/2020 20:07   CT CHEST W CONTRAST  Result Date: 03/31/2020 CLINICAL DATA:  Cancer of unknown primary, altered mental status EXAM: CT CHEST WITH CONTRAST TECHNIQUE: Multidetector CT imaging of the chest was performed during intravenous contrast administration. CONTRAST:  2mL OMNIPAQUE  IOHEXOL 300 MG/ML  SOLN COMPARISON:  None. FINDINGS: Cardiovascular: Moderate coronary artery calcification. Global cardiac size within normal limits. No pericardial effusion. The central pulmonary arteries are enlarged in keeping with changes of pulmonary arterial hypertension. Atherosclerotic calcification is noted within the thoracic aorta and proximal arch vasculature. The thoracic aorta is of normal caliber. Mediastinum/Nodes: There is effacement of the fat planes within the soft tissues of the lower neck, not well characterized on this examination, but possibly the sequela of radiation therapy the appropriate clinical setting. No  pathologic thoracic adenopathy. Esophagus unremarkable. Thyroid unremarkable. Lungs/Pleura: There is moderate to severe centrilobular emphysema present. Moderate, symmetric pulmonary hyperinflation is noted. There is debris within the right lower lobe bronchus extending into the segmental bronchi of the dependent segments of the right lower lobe likely representing a mucous plug or potentially aspirated foreign material. There is moderate bronchial wall thickening noted in keeping with chronic airway inflammation. No pneumothorax or pleural effusion. Upper Abdomen: Unremarkable Musculoskeletal: A T3 left hemivertebra is noted resulting in marked focal levoscoliosis of the a thoracic spine at this level. No acute bone abnormality. IMPRESSION: Moderate to severe centrilobular emphysema. Chronic airway inflammation. Occlusive debris within the right lower lobe bronchi possibly representing a mucous plug or aspirated foreign material. Moderate coronary artery calcification. Morphologic changes in keeping with pulmonary arterial hypertension. Aortic Atherosclerosis (ICD10-I70.0) and Emphysema (ICD10-J43.9). Electronically Signed   By: Fidela Salisbury MD   On: 03/31/2020 19:23   MR BRAIN WO CONTRAST  Result Date: 03/31/2020 CLINICAL DATA:  Encephalopathy EXAM: MRI HEAD WITHOUT CONTRAST TECHNIQUE: Multiplanar, multiecho pulse sequences of the brain and surrounding structures were obtained without intravenous contrast. COMPARISON:  08/09/2016 brain MRI FINDINGS: Brain: No acute infarct, acute hemorrhage or extra-axial collection. Normal white matter signal. Normal volume of CSF spaces. No chronic microhemorrhage. Normal midline structures. Vascular: Normal flow voids. Skull and upper cervical spine: Normal marrow signal. Sinuses/Orbits: Bilateral mastoid fluid. Status post bilateral ocular lens replacement. Other: None IMPRESSION: 1. Normal MRI of the brain. 2. Bilateral mastoid fluid. Electronically Signed   By: Ulyses Jarred M.D.   On: 03/31/2020 22:15   DG Chest Portable 1 View  Result Date: 03/30/2020 CLINICAL DATA:  Altered mental status EXAM: PORTABLE CHEST 1 VIEW COMPARISON:  09/30/2018, 08/09/2016 FINDINGS: Hyperinflation with probable emphysematous disease. Stable cardiomediastinal silhouette. Mild asymmetric density at the right apex. No pneumothorax. IMPRESSION: 1. Hyperinflation with probable emphysematous disease. 2. Slight asymmetric opacity at the right lung apex. Chest CT may be considered for further evaluation to exclude airspace disease or mass at the right apex. Electronically Signed   By: Donavan Foil M.D.   On: 03/30/2020 22:14   ECHOCARDIOGRAM COMPLETE  Result Date: 04/01/2020    ECHOCARDIOGRAM REPORT   Patient Name:   LOYCE FLAMING Date of Exam: 03/31/2020 Medical Rec #:  [id number]      Height:       64.0 in Accession #:    [id number]     Weight:       97.9 lb Date of Birth:  12-17-1955       BSA:          1.444 m Patient Age:    64 years       BP:           156/91 mmHg Patient Gender: F              HR:           110 bpm. Exam Location:  [facility] Procedure: 2D Echo Indications:     CHF 428.21/I50.21  History:         Patient has no prior history of Echocardiogram examinations.                  Risk Factors:Current Smoker and Dyslipidemia.  Sonographer:     Avanell Shackleton Referring Phys:  [id number] ZCHY ALESKEROV Diagnosing Phys: Bartholome Bill MD IMPRESSIONS  1. Left ventricular ejection fraction, by estimation, is 50 to 55%. The left ventricle has low normal function. Left ventricular endocardial border not optimally defined to evaluate regional wall motion. Left ventricular diastolic parameters were normal.  2. Right ventricular systolic function is normal. The right ventricular size is mildly enlarged.  3. Right atrial size was mildly dilated.  4. The mitral valve was not well visualized. Trivial mitral valve regurgitation.  5. The aortic valve is grossly normal. Aortic valve regurgitation is not  visualized. FINDINGS  Left Ventricle: Left ventricular ejection fraction, by estimation, is 50 to 55%. The left ventricle has low normal function. Left ventricular endocardial border not optimally defined to evaluate regional wall motion. The left ventricular internal cavity  size was normal in size. There is borderline left ventricular hypertrophy. Left ventricular diastolic parameters were normal. Right Ventricle: The right ventricular size is mildly enlarged. No increase in right ventricular wall thickness. Right ventricular systolic function is normal. Left Atrium: Left atrial size was normal in size. Right Atrium: Right atrial size was mildly dilated. Pericardium: There is no evidence of pericardial effusion. Mitral Valve: The mitral valve was not well visualized. Trivial mitral valve regurgitation. Tricuspid Valve: The tricuspid valve is not well visualized. Tricuspid valve regurgitation is mild. Aortic Valve: The aortic valve is grossly normal. Aortic valve regurgitation is not visualized. Pulmonic Valve: The pulmonic valve was not well visualized. Pulmonic valve regurgitation is trivial. Aorta: The aortic root was not well visualized. IAS/Shunts: The interatrial septum was not assessed.  LEFT VENTRICLE PLAX 2D LVIDd:         3.64 cm LVIDs:         3.02 cm LV PW:         0.69 cm LV IVS:        0.63 cm  IVC IVC diam: 1.46 cm LEFT ATRIUM           Index       RIGHT ATRIUM           Index LA diam:      3.00 cm 2.08 cm/m  RA Area:     10.50 cm LA Vol (A4C): 74.9 ml 51.87 ml/m RA Volume:   20.90 ml  14.47 ml/m   AORTA Ao Root diam: 3.10 cm MITRAL VALVE MV Area (PHT): 6.83 cm MV Decel Time: 111 msec MV E velocity: 110.00 cm/s MV A velocity: 92.70 cm/s MV E/A ratio:  1.19 Bartholome Bill MD Electronically signed by Bartholome Bill MD Signature Date/Time: 04/01/2020/8:40:31 AM    Final        Subjective: Patient has no complaints.  Eating.  PT Walked pt and ambulated did not need any home PT  Discharge  Exam: Vitals:   04/04/20 0730 04/04/20 1335  BP: (!) 151/92   Pulse: 100   Resp: (!) 24   Temp: 97.8 F (36.6 C)   SpO2: 98% 98%   Vitals:   04/04/20 0200 04/04/20 0224 04/04/20 0730 04/04/20 1335  BP: (!) 137/83 (!) 155/84 (!) 151/92   Pulse: 85 82 100   Resp:  18 21 (!) 24   Temp:  (!) 97.5 F (36.4 C) 97.8 F (36.6 C)   TempSrc:  Oral Oral   SpO2: 97% 100% 98% 98%  Weight:      Height:        General: Pt is alert, awake, not in acute distress Cardiovascular: RRR, S1/S2 +, no rubs, no gallops Respiratory: CTA bilaterally, no wheezing, no rhonchi Abdominal: Soft, NT, ND, bowel sounds + Extremities: no edema, no cyanosis    The results of significant diagnostics from this hospitalization (including imaging, microbiology, ancillary and laboratory) are listed below for reference.     Microbiology: Recent Results (from the past 240 hour(s))  MRSA PCR Screening     Status: None   Collection Time: 03/30/20 11:31 PM   Specimen: Nasopharyngeal  Result Value Ref Range Status   MRSA by PCR NEGATIVE NEGATIVE Final    Comment:        The GeneXpert MRSA Assay (FDA approved for NASAL specimens only), is one component of a comprehensive MRSA colonization surveillance program. It is not intended to diagnose MRSA infection nor to guide or monitor treatment for MRSA infections. Performed at [facility], [address] [place], [place] [address]   SARS Coronavirus 2 by RT PCR (hospital order, performed in [facility] hospital lab)     Status: None   Collection Time: 04/03/20 10:49 PM  Result Value Ref Range Status   SARS Coronavirus 2 NEGATIVE NEGATIVE Final    Comment: (NOTE) SARS-CoV-2 target nucleic acids are NOT DETECTED.  The SARS-CoV-2 RNA is generally detectable in upper and lower respiratory specimens during the acute phase of infection. The lowest concentration of SARS-CoV-2 viral copies this assay can detect is 250 copies / mL. A negative result does  not preclude SARS-CoV-2 infection and should not be used as the sole basis for treatment or other patient management decisions.  A negative result may occur with improper specimen collection / handling, submission of specimen other than nasopharyngeal swab, presence of viral mutation(s) within the areas targeted by this assay, and inadequate number of viral copies (<250 copies / mL). A negative result must be combined with clinical observations, patient history, and epidemiological information.  Fact Sheet for Patients:   StrictlyIdeas.no  Fact Sheet for Healthcare Providers: BankingDealers.co.za  This test is not yet approved or  cleared by the [place] FDA and has been authorized for detection and/or diagnosis of SARS-CoV-2 by FDA under an Emergency Use Authorization (EUA).  This EUA will remain in effect (meaning this test can be used) for the duration of the COVID-19 declaration under Section 564(b)(1) of the Act, 21 U.S.C. section 360bbb-3(b)(1), unless the authorization is terminated or revoked sooner.  Performed at [facility], [address] [place], [place] [address]      Labs: BNP (last 3 results) No results for input(s): BNP in the last 8760 hours. Basic Metabolic Panel: Recent Labs  Lab 03/31/20 1713 03/31/20 1713 03/31/20 2208 03/31/20 2208 04/01/20 0349 04/01/20 0947 04/02/20 0443 04/03/20 0533 04/04/20 0552  NA 127*   < > 129*   < > 134* 132* 136 134* 133*  K 4.0   < > 4.2   < > 4.4 4.5 4.5 4.9 4.9  CL 90*   < > 88*   < > 88* 89* 90* 90* 87*  CO2 28   < > 30   < > 27 32 36* 36* 37*  GLUCOSE 147*   < > 141*   < >  151* 132* 126* 140* 124*  BUN 8   < > 9   < > 11 13 18 22 17   CREATININE 0.69   < > 0.75   < > 0.95 0.78 0.84 0.79 0.64  CALCIUM 8.6*   < > 8.6*   < > 9.0 8.5* 8.6* 8.5* 7.8*  MG 2.2  --  2.1  --   --   --  2.3 2.3 2.1  PHOS  --   --   --   --   --   --  4.0 4.0 3.5   < > =  values in this interval not displayed.   Liver Function Tests: Recent Labs  Lab 03/30/20 1925  AST 28  ALT 16  ALKPHOS 63  BILITOT 1.3*  PROT 5.8*  ALBUMIN 3.6   No results for input(s): LIPASE, AMYLASE in the last 168 hours. Recent Labs  Lab 03/31/20 0114  AMMONIA 17   CBC: Recent Labs  Lab 03/30/20 1925 03/30/20 1925 03/30/20 2110 03/31/20 0114 04/02/20 0443 04/03/20 0533 04/04/20 0552  WBC 6.2   < > 6.2 5.5 12.3* 12.7* 12.5*  NEUTROABS 3.9  --  3.7  --  11.4* 12.0* 11.3*  HGB RESULTS UNAVAILABLE DUE TO INTERFERING SUBSTANCE   < > 13.1 13.0 12.5 12.8 12.7  HCT RESULTS UNAVAILABLE DUE TO INTERFERING SUBSTANCE   < > RESULTS UNAVAILABLE DUE TO INTERFERING SUBSTANCE RESULTS UNAVAILABLE DUE TO INTERFERING SUBSTANCE 36.0 37.5 37.9  MCV RESULTS UNAVAILABLE DUE TO INTERFERING SUBSTANCE   < > RESULTS UNAVAILABLE DUE TO INTERFERING SUBSTANCE RESULTS UNAVAILABLE DUE TO INTERFERING SUBSTANCE 98.6 100.3* 100.3*  PLT 230   < > 222 214 248 227 216   < > = values in this interval not displayed.   Cardiac Enzymes: No results for input(s): CKTOTAL, CKMB, CKMBINDEX, TROPONINI in the last 168 hours. BNP: Invalid input(s): POCBNP CBG: Recent Labs  Lab 03/30/20 2316  GLUCAP 93   D-Dimer No results for input(s): DDIMER in the last 72 hours. Hgb A1c No results for input(s): HGBA1C in the last 72 hours. Lipid Profile No results for input(s): CHOL, HDL, LDLCALC, TRIG, CHOLHDL, LDLDIRECT in the last 72 hours. Thyroid function studies No results for input(s): TSH, T4TOTAL, T3FREE, THYROIDAB in the last 72 hours.  Invalid input(s): FREET3 Anemia work up No results for input(s): VITAMINB12, FOLATE, FERRITIN, TIBC, IRON, RETICCTPCT in the last 72 hours. Urinalysis    Component Value Date/Time   COLORURINE YELLOW (A) 03/30/2020 1925   APPEARANCEUR HAZY (A) 03/30/2020 1925   LABSPEC 1.005 03/30/2020 1925   PHURINE 6.0 03/30/2020 1925   GLUCOSEU NEGATIVE 03/30/2020 1925   HGBUR  MODERATE (A) 03/30/2020 1925   BILIRUBINUR NEGATIVE 03/30/2020 1925   KETONESUR NEGATIVE 03/30/2020 1925   PROTEINUR NEGATIVE 03/30/2020 1925   NITRITE POSITIVE (A) 03/30/2020 1925   LEUKOCYTESUR TRACE (A) 03/30/2020 1925   Sepsis Labs Invalid input(s): PROCALCITONIN,  WBC,  LACTICIDVEN Microbiology Recent Results (from the past 240 hour(s))  MRSA PCR Screening     Status: None   Collection Time: 03/30/20 11:31 PM   Specimen: Nasopharyngeal  Result Value Ref Range Status   MRSA by PCR NEGATIVE NEGATIVE Final    Comment:        The GeneXpert MRSA Assay (FDA approved for NASAL specimens only), is one component of a comprehensive MRSA colonization surveillance program. It is not intended to diagnose MRSA infection nor to guide or monitor treatment for MRSA infections. Performed at [facility], [address] [place], [place] [address]   SARS Coronavirus 2 by RT PCR (hospital order, performed in [place] hospital lab)     Status: None   Collection Time: 04/03/20 10:49 PM  Result Value Ref Range Status   SARS Coronavirus 2 NEGATIVE NEGATIVE Final    Comment: (NOTE) SARS-CoV-2 target nucleic acids are NOT DETECTED.  The SARS-CoV-2 RNA is generally detectable in upper and lower respiratory specimens during the acute phase of infection. The lowest concentration of SARS-CoV-2 viral copies this assay can detect is 250 copies / mL. A negative result does not preclude SARS-CoV-2 infection and should not be used as the sole basis for treatment or other patient management decisions.  A negative result may occur with improper specimen collection / handling, submission of specimen other than nasopharyngeal swab, presence of viral mutation(s) within the areas targeted by this assay, and inadequate number of viral copies (<250 copies / mL). A negative result must be combined with clinical observations, patient history, and epidemiological information.  Fact Sheet for  Patients:   StrictlyIdeas.no  Fact Sheet for Healthcare Providers: BankingDealers.co.za  This test is not yet approved or  cleared by the [place] FDA and has been authorized for detection and/or diagnosis of SARS-CoV-2 by FDA under an Emergency Use Authorization (EUA).  This EUA will remain in effect (meaning this test can be used) for the duration of the COVID-19 declaration under Section 564(b)(1) of the Act, 21 U.S.C. section 360bbb-3(b)(1), unless the authorization is terminated or revoked sooner.  Performed at [facility], [address] [place], [place] [address]      Time coordinating discharge: Over 30 minutes  SIGNED:   Nolberto Hanlon, MD  [facility] 04/04/2020, 2:07 PM Pager   If 7PM-7AM, please contact night-coverage [email or web address] Password [email or web address]

## 2020-04-04 NOTE — Progress Notes (Signed)
Patient transferred to room 130 by transporter via wheelchair.

## 2020-04-04 NOTE — Evaluation (Signed)
Physical Therapy Evaluation Patient Details Name: TAMMEE THIELKE MRN: [id number] DOB: Apr 09, 1956 Today's Date: 04/04/2020   History of Present Illness  Cherree Conerly is a 64 yo WF who presented via the [facility] ED to the ICU for altered mental status. The patient was transported via EMS after having been called by the patient's husband. Upon squad arrival, they noted Ms. Daddario to be somnolent with two unidentified tablets nearby. Narcan was administered with little benefit. Oxygen was administered as her initial saturations were in the mid-80's. The patient is known to have COPD, there is some question as to her potential home oxygen need at baseline.  Clinical Impression  Patient received in bed, reports she is feeling better but tired and did not get much sleep last night. She is agreeable to PT assessment. She performed bed mobility with mod independence, transferred sit to stand with supervision. Ambulated 225' with iv pole and min guard. No lob, decreased gait speed. SOB reported at end of session with sats in mid to upper 80%s on 2 lpm. Returned to mid 90%s within 1 min of seated rest and deep breathing. She will continue to benefit from skilled PT while here to improve activity tolerance.      Follow Up Recommendations No PT follow up    Equipment Recommendations  None recommended by PT    Recommendations for Other Services       Precautions / Restrictions Precautions Precautions: Fall Restrictions Weight Bearing Restrictions: No      Mobility  Bed Mobility Overal bed mobility: Independent                Transfers Overall transfer level: Independent Equipment used: None                Ambulation/Gait Ambulation/Gait assistance: Min Conservator, museum/gallery (Feet): 225 Feet Assistive device: IV Pole Gait Pattern/deviations: Step-through pattern;Decreased stride length Gait velocity: decreased   General Gait Details: patient is generally steady with ambulation.  Pushing pole not really out of necessity. O2 sats dropped into high 80%s with ambulation on 2 lpm O2. SOB reported. Able to recover quickly with rest and deep breathing  Stairs            Wheelchair Mobility    Modified Rankin (Stroke Patients Only)       Balance Overall balance assessment: Mild deficits observed, not formally tested                                           Pertinent Vitals/Pain Pain Assessment: No/denies pain    Home Living Family/patient expects to be discharged to:: Private residence Living Arrangements: Spouse/significant other Available Help at Discharge: Family;Available PRN/intermittently Type of Home: House Home Access: Level entry     Home Layout: One level Home Equipment: None Additional Comments: has equipment she can use if needed    Prior Function Level of Independence: Independent               Hand Dominance        Extremity/Trunk Assessment   Upper Extremity Assessment Upper Extremity Assessment: Generalized weakness    Lower Extremity Assessment Lower Extremity Assessment: Generalized weakness    Cervical / Trunk Assessment Cervical / Trunk Assessment: Normal  Communication   Communication: No difficulties  Cognition Arousal/Alertness: Awake/alert Behavior During Therapy: WFL for tasks assessed/performed Overall Cognitive Status: Within Functional Limits for tasks  assessed                                        General Comments General comments (skin integrity, edema, etc.): reports no falls    Exercises     Assessment/Plan    PT Assessment Patient needs continued PT services  PT Problem List Decreased strength;Decreased mobility;Decreased activity tolerance;Cardiopulmonary status limiting activity       PT Treatment Interventions Therapeutic activities;Gait training;Therapeutic exercise;Functional mobility training;Patient/family education    PT Goals (Current  goals can be found in the Care Plan section)  Acute Rehab PT Goals Patient Stated Goal: to return home PT Goal Formulation: With patient Time For Goal Achievement: 04/15/20 Potential to Achieve Goals: Good    Frequency Min 2X/week   Barriers to discharge        Co-evaluation               AM-PAC PT "6 Clicks" Mobility  Outcome Measure Help needed turning from your back to your side while in a flat bed without using bedrails?: None Help needed moving from lying on your back to sitting on the side of a flat bed without using bedrails?: None Help needed moving to and from a bed to a chair (including a wheelchair)?: None Help needed standing up from a chair using your arms (e.g., wheelchair or bedside chair)?: None Help needed to walk in hospital room?: A Little Help needed climbing 3-5 steps with a railing? : A Little 6 Click Score: 22    End of Session Equipment Utilized During Treatment: Gait belt;Oxygen Activity Tolerance: Patient tolerated treatment well Patient left: in bed;with bed alarm set;with call bell/phone within reach Nurse Communication: Mobility status PT Visit Diagnosis: Muscle weakness (generalized) (M62.81)    Time: 4680-3212 PT Time Calculation (min) (ACUTE ONLY): 28 min   Charges:   PT Evaluation $PT Eval Moderate Complexity: 1 Mod PT Treatments $Gait Training: 8-22 mins        Asley Baskerville, PT, GCS 04/04/20,1:44 PM

## 2020-04-04 NOTE — Progress Notes (Signed)
Patient received and understands discharge instructions 

## 2020-04-04 NOTE — Progress Notes (Signed)
Covid test sent, awaiting results.

## 2020-04-04 NOTE — Progress Notes (Signed)
PHARMACY CONSULT NOTE - FOLLOW UP  Pharmacy Consult for Electrolyte Monitoring and Replacement   Recent Labs: Potassium (mmol/L)  Date Value  04/04/2020 4.9   Magnesium (mg/dL)  Date Value  04/04/2020 2.1   Calcium (mg/dL)  Date Value  04/04/2020 7.8 (L)   Albumin (g/dL)  Date Value  03/30/2020 3.6   Phosphorus (mg/dL)  Date Value  04/04/2020 3.5   Sodium (mmol/L)  Date Value  04/04/2020 133 (L)   Assessment: Patient admitted to ICU with hyponatremia. Initially started on hypertonic saline which was then changed to NS and then LR due to rapid correction of sodium. Currently NPO in setting of AMS.  Transferred out of ICU to 1C overnight  Goal of Therapy:  Lytes WNL, K+ >/= 4  Plan:  Electrolytes WNL.   No supplementation needed at this time. Will sign off at this time as pt transferred out of ICU.   Wendy Ferrell, PharmD, BCPS Clinical Pharmacist 04/04/2020 8:02 AM

## 2020-04-13 ENCOUNTER — Other Ambulatory Visit (INDEPENDENT_AMBULATORY_CARE_PROVIDER_SITE_OTHER): Payer: Self-pay | Admitting: Nurse Practitioner

## 2020-05-09 MED FILL — Methylprednisolone Sod Succ For Inj 40 MG (Base Equiv): INTRAMUSCULAR | Qty: 1 | Status: AC

## 2020-07-14 ENCOUNTER — Other Ambulatory Visit (INDEPENDENT_AMBULATORY_CARE_PROVIDER_SITE_OTHER): Payer: Self-pay | Admitting: Nurse Practitioner

## 2020-07-14 NOTE — Telephone Encounter (Signed)
Patient last seen 07/07/2018 and cancel appt for 07/08/2019 should refill continue

## 2020-07-15 NOTE — Telephone Encounter (Signed)
I left a detailed message on the patient voicemail from note below

## 2020-07-15 NOTE — Telephone Encounter (Signed)
Patient needs appointment with ABIs and carotid before we can do refill since it has been two years

## 2020-07-28 ENCOUNTER — Other Ambulatory Visit (INDEPENDENT_AMBULATORY_CARE_PROVIDER_SITE_OTHER): Payer: Self-pay | Admitting: Vascular Surgery

## 2020-07-28 ENCOUNTER — Other Ambulatory Visit (INDEPENDENT_AMBULATORY_CARE_PROVIDER_SITE_OTHER): Payer: Self-pay | Admitting: Nurse Practitioner

## 2020-07-28 DIAGNOSIS — I739 Peripheral vascular disease, unspecified: Secondary | ICD-10-CM

## 2020-07-28 DIAGNOSIS — R55 Syncope and collapse: Secondary | ICD-10-CM

## 2020-07-28 DIAGNOSIS — I6523 Occlusion and stenosis of bilateral carotid arteries: Secondary | ICD-10-CM

## 2020-07-29 ENCOUNTER — Ambulatory Visit (INDEPENDENT_AMBULATORY_CARE_PROVIDER_SITE_OTHER): Payer: No Typology Code available for payment source

## 2020-07-29 ENCOUNTER — Ambulatory Visit (INDEPENDENT_AMBULATORY_CARE_PROVIDER_SITE_OTHER): Payer: No Typology Code available for payment source | Admitting: Nurse Practitioner

## 2020-07-29 ENCOUNTER — Other Ambulatory Visit: Payer: Self-pay

## 2020-07-29 ENCOUNTER — Encounter (INDEPENDENT_AMBULATORY_CARE_PROVIDER_SITE_OTHER): Payer: Self-pay | Admitting: Nurse Practitioner

## 2020-07-29 VITALS — BP 136/71 | HR 84 | Resp 14 | Ht 64.0 in | Wt 94.0 lb

## 2020-07-29 DIAGNOSIS — I739 Peripheral vascular disease, unspecified: Secondary | ICD-10-CM

## 2020-07-29 DIAGNOSIS — R55 Syncope and collapse: Secondary | ICD-10-CM

## 2020-07-29 DIAGNOSIS — I6523 Occlusion and stenosis of bilateral carotid arteries: Secondary | ICD-10-CM

## 2020-07-29 DIAGNOSIS — J449 Chronic obstructive pulmonary disease, unspecified: Secondary | ICD-10-CM | POA: Diagnosis not present

## 2020-07-29 DIAGNOSIS — E782 Mixed hyperlipidemia: Secondary | ICD-10-CM | POA: Diagnosis not present

## 2020-07-29 MED ORDER — CLOPIDOGREL BISULFATE 75 MG PO TABS: 75.0000 mg | ORAL_TABLET | Freq: Every day | ORAL | 4 refills | Status: DC

## 2020-08-05 ENCOUNTER — Encounter (INDEPENDENT_AMBULATORY_CARE_PROVIDER_SITE_OTHER): Payer: Self-pay | Admitting: Nurse Practitioner

## 2020-08-05 NOTE — Progress Notes (Signed)
Subjective:    Patient ID: Wendy Ferrell, female    DOB: 1956-08-26, 64 y.o.   MRN: [id number] Chief Complaint  Patient presents with  . Follow-up    ultrasound    The patient returns to the office for followup and review of the noninvasive studies. There have been no interval changes in lower extremity symptoms. No interval shortening of the patient's claudication distance or development of rest pain symptoms. No new ulcers or wounds have occurred since the last visit.  There have been no significant changes to the patient's overall health care.  The patient denies amaurosis fugax or recent TIA symptoms. There are no recent neurological changes noted. The patient denies history of DVT, PE or superficial thrombophlebitis. The patient denies recent episodes of angina or shortness of breath.   ABI Rt=1.00 and Lt=1.00  (previous ABI's Rt=0.99 and Lt=0.98) Duplex ultrasound of the patient has triphasic anterior tibial artery waveforms bilaterally.  The right posterior tibial artery has monophasic waveforms with biphasic waveforms in the left posterior tibial artery.  The patient has good toe waveforms bilaterally  The patient is seen for follow up evaluation of carotid stenosis. The carotid stenosis followed by ultrasound.   The patient is taking enteric-coated aspirin 81 mg daily.  There is no history of migraine headaches. There is no history of seizures.  There is a history of hyperlipidemia which is being treated with a statin.    Carotid Duplex done today shows 40-59%.  Compared to the previous study the right ICA percentage has increased.  Comparison study was done on 07/07/2018.   Review of Systems  Respiratory: Positive for cough.   Musculoskeletal: Negative for gait problem.  Neurological: Negative for weakness.  All other systems reviewed and are negative.      Objective:   Physical Exam Vitals reviewed.  HENT:     Head: Normocephalic.  Cardiovascular:     Rate  and Rhythm: Normal rate.     Pulses: Decreased pulses.  Pulmonary:     Effort: Pulmonary effort is normal.  Skin:    General: Skin is warm and dry.  Neurological:     Mental Status: She is alert and oriented to person, place, and time.  Psychiatric:        Mood and Affect: Mood normal.        Behavior: Behavior normal.        Thought Content: Thought content normal.        Judgment: Judgment normal.     BP 136/71 (BP Location: Right Arm)   Pulse 84   Resp 14   Ht 5\' 4"  (1.626 m)   Wt 94 lb (42.6 kg)   BMI 16.14 kg/m   Past Medical History:  Diagnosis Date  . COPD (chronic obstructive pulmonary disease) ([place])   . Hypertension   . Nicotine addiction   . Oxygen dependent   . Peripheral vascular disease ([place])     Social History   Socioeconomic History  . Marital status: Legally Separated    Spouse name: Not on file  . Number of children: Not on file  . Years of education: Not on file  . Highest education level: Not on file  Occupational History  . Not on file  Tobacco Use  . Smoking status: Former Smoker    Packs/day: 0.50    Years: 45.00    Pack years: 22.50    Types: Cigarettes  . Smokeless tobacco: Never Used  Vaping Use  . Vaping  Use: Never used  Substance and Sexual Activity  . Alcohol use: No  . Drug use: No  . Sexual activity: Not on file  Other Topics Concern  . Not on file  Social History Narrative  . Not on file   Social Determinants of Health   Financial Resource Strain:   . Difficulty of Paying Living Expenses: Not on file  Food Insecurity:   . Worried About Charity fundraiser in the Last Year: Not on file  . Ran Out of Food in the Last Year: Not on file  Transportation Needs:   . Lack of Transportation (Medical): Not on file  . Lack of Transportation (Non-Medical): Not on file  Physical Activity:   . Days of Exercise per Week: Not on file  . Minutes of Exercise per Session: Not on file  Stress:   . Feeling of Stress : Not on file    Social Connections:   . Frequency of Communication with Friends and Family: Not on file  . Frequency of Social Gatherings with Friends and Family: Not on file  . Attends Religious Services: Not on file  . Active Member of Clubs or Organizations: Not on file  . Attends Archivist Meetings: Not on file  . Marital Status: Not on file  Intimate Partner Violence:   . Fear of Current or Ex-Partner: Not on file  . Emotionally Abused: Not on file  . Physically Abused: Not on file  . Sexually Abused: Not on file    Past Surgical History:  Procedure Laterality Date  . ABDOMINAL HYSTERECTOMY    . COLONOSCOPY WITH PROPOFOL N/A 02/21/2016   Procedure: COLONOSCOPY WITH PROPOFOL;  Surgeon: Lollie Sails, MD;  Location: [facility] ENDOSCOPY;  Service: Endoscopy;  Laterality: N/A;  . LOWER EXTREMITY ANGIOGRAPHY Right 05/28/2017   Procedure: Lower Extremity Angiography;  Surgeon: Katha Cabal, MD;  Location: [facility] [facility];  Service: Cardiovascular;  Laterality: Right;  . MASTOIDECTOMY    . NECK SURGERY    . PERIPHERAL VASCULAR CATHETERIZATION Left 05/29/2016   Procedure: Lower Extremity Angiography;  Surgeon: Katha Cabal, MD;  Location: [place] [facility];  Service: Cardiovascular;  Laterality: Left;  . PERIPHERAL VASCULAR CATHETERIZATION  05/29/2016   Procedure: Lower Extremity Intervention;  Surgeon: Katha Cabal, MD;  Location: [place] [facility];  Service: Cardiovascular;;  . TONSILLECTOMY      Family History  Problem Relation Age of Onset  . COPD Mother   . Hypertension Mother   . COPD Father   . Breast cancer Maternal Grandmother 60  . Breast cancer Cousin 50       maternal side    No Known Allergies  CBC Latest Ref Rng & Units 04/04/2020 04/03/2020 04/02/2020  WBC 4.0 - 10.5 K/uL 12.5(H) 12.7(H) 12.3(H)  Hemoglobin 12.0 - 15.0 g/dL 12.7 12.8 12.5  Hematocrit 36 - 46 % 37.9 37.5 36.0  Platelets 150 - 400 K/uL 216 227 248      CMP     Component  Value Date/Time   NA 133 (L) 04/04/2020 0552   K 4.9 04/04/2020 0552   CL 87 (L) 04/04/2020 0552   CO2 37 (H) 04/04/2020 0552   GLUCOSE 124 (H) 04/04/2020 0552   BUN 17 04/04/2020 0552   CREATININE 0.64 04/04/2020 0552   CALCIUM 7.8 (L) 04/04/2020 0552   PROT 5.8 (L) 03/30/2020 1925   ALBUMIN 3.6 03/30/2020 1925   AST 28 03/30/2020 1925   ALT 16 03/30/2020 1925  ALKPHOS 63 03/30/2020 1925   BILITOT 1.3 (H) 03/30/2020 1925   GFRNONAA >60 04/04/2020 0552   GFRAA >60 04/04/2020 0552     VAS [place] ABI WITH/WO TBI  Result Date: 08/01/2020 LOWER EXTREMITY DOPPLER STUDY Indications: Peripheral artery disease.  Comparison Study: 07/07/2018 Performing Technologist: Almira Coaster RVS  Examination Guidelines: A complete evaluation includes at minimum, Doppler waveform signals and systolic blood pressure reading at the level of bilateral brachial, anterior tibial, and posterior tibial arteries, when vessel segments are accessible. Bilateral testing is considered an integral part of a complete examination. Photoelectric Plethysmograph (PPG) waveforms and toe systolic pressure readings are included as required and additional duplex testing as needed. Limited examinations for reoccurring indications may be performed as noted.  ABI Findings: +---------+------------------+-----+----------+--------+ Right    Rt Pressure (mmHg)IndexWaveform  Comment  +---------+------------------+-----+----------+--------+ Brachial 135                                       +---------+------------------+-----+----------+--------+ ATA      139               1.00 triphasic          +---------+------------------+-----+----------+--------+ PTA      114               0.82 monophasic         +---------+------------------+-----+----------+--------+ Great Toe123               0.88 Normal             +---------+------------------+-----+----------+--------+  +---------+------------------+-----+---------+-------+ Left     Lt Pressure (mmHg)IndexWaveform Comment +---------+------------------+-----+---------+-------+ Brachial 139                                     +---------+------------------+-----+---------+-------+ ATA      139               1.00 triphasic        +---------+------------------+-----+---------+-------+ PTA      123               0.88 biphasic         +---------+------------------+-----+---------+-------+ Great Toe106               0.76 Normal           +---------+------------------+-----+---------+-------+ +-------+-----------+-----------+------------+------------+ ABI/TBIToday's ABIToday's TBIPrevious ABIPrevious TBI +-------+-----------+-----------+------------+------------+ Right  1.00       .88        .99         .88          +-------+-----------+-----------+------------+------------+ Left   1.00       .76        .98         .65          +-------+-----------+-----------+------------+------------+ Bilateral ABIs appear essentially unchanged compared to prior study on 07/07/2018. Left TBIs appear increased compared to prior study on 07/07/2018. Right TBIs appear essentially unchanged compared to prior study on 07/07/2018.  Summary: Right: Resting right ankle-brachial index is within normal range. No evidence of significant right lower extremity arterial disease. The right toe-brachial index is normal. Left: Resting left ankle-brachial index is within normal range. No evidence of significant left lower extremity arterial disease. The left toe-brachial index is normal.  *See table(s) above for measurements and observations.  Electronically signed by Hortencia Pilar  MD on 08/01/2020 at 4:47:04 PM.    Final        Assessment & Plan:   1. PAD (peripheral artery disease) (HCC)  Recommend:  The patient has evidence of atherosclerosis of the lower extremities with claudication.  The patient does not  voice lifestyle limiting changes at this point in time.  Noninvasive studies do not suggest clinically significant change.  No invasive studies, angiography or surgery at this time The patient should continue walking and begin a more formal exercise program.  The patient should continue antiplatelet therapy and aggressive treatment of the lipid abnormalities  No changes in the patient's medications at this time  The patient should continue wearing graduated compression socks 10-15 mmHg strength to control the mild edema.   Patient will return in 1 year  - clopidogrel (PLAVIX) 75 MG tablet; Take 1 tablet (75 mg total) by mouth daily.  Dispense: 90 tablet; Refill: 4  2. Mixed hyperlipidemia Continue statin as ordered and reviewed, no changes at this time   3. Bilateral carotid artery stenosis Recommend:  Given the patient's asymptomatic subcritical stenosis no further invasive testing or surgery at this time.  Duplex ultrasound shows 40-59% stenosis bilaterally.  Continue antiplatelet therapy as prescribed Continue management of CAD, HTN and Hyperlipidemia Healthy heart diet,  encouraged exercise at least 4 times per week Follow up in 12 months with duplex ultrasound and physical exam   4. Chronic obstructive pulmonary disease, unspecified COPD type ([place]) Continue pulmonary medications and aerosols as already ordered, these medications have been reviewed and there are no changes at this time.     Current Outpatient Medications on File Prior to Visit  Medication Sig Dispense Refill  . aspirin 81 MG chewable tablet Chew by mouth daily.    Marland Kitchen atorvastatin (LIPITOR) 40 MG tablet Take 40 mg by mouth daily.    . chlorthalidone (HYGROTON) 25 MG tablet Take 12.5 mg by mouth every morning.    . COMBIVENT RESPIMAT 20-100 MCG/ACT AERS respimat Inhale 1 puff into the lungs 3 (three) times daily.     Marland Kitchen losartan (COZAAR) 25 MG tablet Take 25 mg by mouth daily.    . TRELEGY ELLIPTA  100-62.5-25 MCG/INH AEPB INHALE 1 INHALTION DAILY  3  . amLODipine (NORVASC) 10 MG tablet Take 1 tablet (10 mg total) by mouth daily. 30 tablet 0   No current facility-administered medications on file prior to visit.    There are no Patient Instructions on file for this visit. No follow-ups on file.   Kris Hartmann, NP

## 2020-12-12 ENCOUNTER — Other Ambulatory Visit: Payer: Self-pay | Admitting: Internal Medicine

## 2020-12-12 DIAGNOSIS — Z1231 Encounter for screening mammogram for malignant neoplasm of breast: Secondary | ICD-10-CM

## 2021-01-12 ENCOUNTER — Other Ambulatory Visit: Payer: Self-pay

## 2021-01-12 ENCOUNTER — Ambulatory Visit
Admission: RE | Admit: 2021-01-12 | Discharge: 2021-01-12 | Disposition: A | Discharge status: Home / Self Care | Payer: Managed Care, Other (non HMO) | Source: Ambulatory Visit | Attending: Internal Medicine | Admitting: Internal Medicine

## 2021-01-12 DIAGNOSIS — Z1231 Encounter for screening mammogram for malignant neoplasm of breast: Secondary | ICD-10-CM | POA: Diagnosis not present

## 2021-04-20 DIAGNOSIS — R7303 Prediabetes: Secondary | ICD-10-CM | POA: Diagnosis not present

## 2021-04-20 DIAGNOSIS — E785 Hyperlipidemia, unspecified: Secondary | ICD-10-CM | POA: Diagnosis not present

## 2021-04-20 DIAGNOSIS — I739 Peripheral vascular disease, unspecified: Secondary | ICD-10-CM | POA: Diagnosis not present

## 2021-04-22 DIAGNOSIS — J449 Chronic obstructive pulmonary disease, unspecified: Secondary | ICD-10-CM | POA: Diagnosis not present

## 2021-04-26 DIAGNOSIS — J9611 Chronic respiratory failure with hypoxia: Secondary | ICD-10-CM | POA: Diagnosis not present

## 2021-04-26 DIAGNOSIS — I10 Essential (primary) hypertension: Secondary | ICD-10-CM | POA: Diagnosis not present

## 2021-04-26 DIAGNOSIS — N189 Chronic kidney disease, unspecified: Secondary | ICD-10-CM | POA: Diagnosis not present

## 2021-04-26 DIAGNOSIS — G4733 Obstructive sleep apnea (adult) (pediatric): Secondary | ICD-10-CM | POA: Diagnosis not present

## 2021-04-26 DIAGNOSIS — J449 Chronic obstructive pulmonary disease, unspecified: Secondary | ICD-10-CM | POA: Diagnosis not present

## 2021-04-26 DIAGNOSIS — D759 Disease of blood and blood-forming organs, unspecified: Secondary | ICD-10-CM | POA: Diagnosis not present

## 2021-04-26 DIAGNOSIS — I719 Aortic aneurysm of unspecified site, without rupture: Secondary | ICD-10-CM | POA: Diagnosis not present

## 2021-04-26 DIAGNOSIS — I739 Peripheral vascular disease, unspecified: Secondary | ICD-10-CM | POA: Diagnosis not present

## 2021-05-23 DIAGNOSIS — J449 Chronic obstructive pulmonary disease, unspecified: Secondary | ICD-10-CM | POA: Diagnosis not present

## 2021-06-22 DIAGNOSIS — J449 Chronic obstructive pulmonary disease, unspecified: Secondary | ICD-10-CM | POA: Diagnosis not present

## 2021-07-23 DIAGNOSIS — J449 Chronic obstructive pulmonary disease, unspecified: Secondary | ICD-10-CM | POA: Diagnosis not present

## 2021-07-27 ENCOUNTER — Ambulatory Visit (INDEPENDENT_AMBULATORY_CARE_PROVIDER_SITE_OTHER): Payer: No Typology Code available for payment source | Admitting: Vascular Surgery

## 2021-07-27 ENCOUNTER — Encounter (INDEPENDENT_AMBULATORY_CARE_PROVIDER_SITE_OTHER): Payer: No Typology Code available for payment source

## 2021-07-28 DIAGNOSIS — I719 Aortic aneurysm of unspecified site, without rupture: Secondary | ICD-10-CM | POA: Diagnosis not present

## 2021-07-28 DIAGNOSIS — N189 Chronic kidney disease, unspecified: Secondary | ICD-10-CM | POA: Diagnosis not present

## 2021-07-28 DIAGNOSIS — D759 Disease of blood and blood-forming organs, unspecified: Secondary | ICD-10-CM | POA: Diagnosis not present

## 2021-07-28 DIAGNOSIS — J208 Acute bronchitis due to other specified organisms: Secondary | ICD-10-CM | POA: Diagnosis not present

## 2021-07-28 DIAGNOSIS — J449 Chronic obstructive pulmonary disease, unspecified: Secondary | ICD-10-CM | POA: Diagnosis not present

## 2021-07-28 DIAGNOSIS — I10 Essential (primary) hypertension: Secondary | ICD-10-CM | POA: Diagnosis not present

## 2021-07-28 DIAGNOSIS — I739 Peripheral vascular disease, unspecified: Secondary | ICD-10-CM | POA: Diagnosis not present

## 2021-07-28 DIAGNOSIS — R7303 Prediabetes: Secondary | ICD-10-CM | POA: Diagnosis not present

## 2021-07-28 DIAGNOSIS — J9611 Chronic respiratory failure with hypoxia: Secondary | ICD-10-CM | POA: Diagnosis not present

## 2021-07-28 DIAGNOSIS — I2510 Atherosclerotic heart disease of native coronary artery without angina pectoris: Secondary | ICD-10-CM | POA: Diagnosis not present

## 2021-07-28 DIAGNOSIS — Z23 Encounter for immunization: Secondary | ICD-10-CM | POA: Diagnosis not present

## 2021-07-28 DIAGNOSIS — G4733 Obstructive sleep apnea (adult) (pediatric): Secondary | ICD-10-CM | POA: Diagnosis not present

## 2021-08-21 ENCOUNTER — Encounter (INDEPENDENT_AMBULATORY_CARE_PROVIDER_SITE_OTHER): Payer: Self-pay | Admitting: Vascular Surgery

## 2021-08-21 ENCOUNTER — Ambulatory Visit (INDEPENDENT_AMBULATORY_CARE_PROVIDER_SITE_OTHER): Payer: Medicare Other | Admitting: Vascular Surgery

## 2021-08-21 ENCOUNTER — Ambulatory Visit (INDEPENDENT_AMBULATORY_CARE_PROVIDER_SITE_OTHER): Payer: Medicare Other

## 2021-08-21 ENCOUNTER — Other Ambulatory Visit (INDEPENDENT_AMBULATORY_CARE_PROVIDER_SITE_OTHER): Payer: Self-pay | Admitting: Nurse Practitioner

## 2021-08-21 ENCOUNTER — Other Ambulatory Visit: Payer: Self-pay

## 2021-08-21 VITALS — BP 115/56 | HR 84 | Resp 15 | Wt 99.8 lb

## 2021-08-21 DIAGNOSIS — I739 Peripheral vascular disease, unspecified: Secondary | ICD-10-CM

## 2021-08-21 DIAGNOSIS — I6523 Occlusion and stenosis of bilateral carotid arteries: Secondary | ICD-10-CM | POA: Diagnosis not present

## 2021-08-21 DIAGNOSIS — I70213 Atherosclerosis of native arteries of extremities with intermittent claudication, bilateral legs: Secondary | ICD-10-CM | POA: Diagnosis not present

## 2021-08-21 DIAGNOSIS — J449 Chronic obstructive pulmonary disease, unspecified: Secondary | ICD-10-CM | POA: Diagnosis not present

## 2021-08-21 DIAGNOSIS — E782 Mixed hyperlipidemia: Secondary | ICD-10-CM | POA: Diagnosis not present

## 2021-08-22 DIAGNOSIS — J449 Chronic obstructive pulmonary disease, unspecified: Secondary | ICD-10-CM | POA: Diagnosis not present

## 2021-08-26 ENCOUNTER — Encounter (INDEPENDENT_AMBULATORY_CARE_PROVIDER_SITE_OTHER): Payer: Self-pay | Admitting: Vascular Surgery

## 2021-08-26 NOTE — Progress Notes (Signed)
MRN : [id number]  Wendy Ferrell is a 65 y.o. ([birth year]) female who presents with chief complaint of check circulation.  History of Present Illness:  The patient returns to the office for followup and review of the noninvasive studies. There have been no interval changes in lower extremity symptoms. No interval shortening of the patient's claudication distance or development of rest pain symptoms. No new ulcers or wounds have occurred since the last visit.   The patient is also seen for follow up evaluation of carotid stenosis. The carotid stenosis followed by ultrasound.    The patient denies amaurosis fugax. There is no recent history of TIA symptoms or focal motor deficits. There is no prior documented CVA.   The patient is taking enteric-coated aspirin 81 mg daily.   There is no history of migraine headaches. There is no history of seizures.   There have been no significant changes to the patient's overall health care.   The patient denies history of DVT, PE or superficial thrombophlebitis.   The patient denies recent episodes of angina or shortness of breath.    ABI Rt=0.92 and Lt=0.94  (previous ABI's Rt=1.00 and Lt=1.00)  Previous duplex ultrasound of the aorta iliac arteries shows the stents are widely patent   Carotid Duplex done today shows RICA 95-18% and LICA 84-16%.  No change compared to last study in 07/29/2020  Current Meds  Medication Sig   aspirin 81 MG chewable tablet Chew by mouth daily.   atorvastatin (LIPITOR) 40 MG tablet Take 40 mg by mouth daily.   chlorthalidone (HYGROTON) 25 MG tablet Take 12.5 mg by mouth every morning.   clopidogrel (PLAVIX) 75 MG tablet Take 1 tablet (75 mg total) by mouth daily.   COMBIVENT RESPIMAT 20-100 MCG/ACT AERS respimat Inhale 1 puff into the lungs 3 (three) times daily.    losartan (COZAAR) 25 MG tablet Take 25 mg by mouth daily.   OXYGEN Inhale into the lungs.   TRELEGY ELLIPTA 100-62.5-25 MCG/INH AEPB INHALE 1  INHALTION DAILY    Past Medical History:  Diagnosis Date   COPD (chronic obstructive pulmonary disease) ([place])    Hypertension    Nicotine addiction    Oxygen dependent    Peripheral vascular disease ([place])     Past Surgical History:  Procedure Laterality Date   ABDOMINAL HYSTERECTOMY     COLONOSCOPY WITH PROPOFOL N/A 02/21/2016   Procedure: COLONOSCOPY WITH PROPOFOL;  Surgeon: Lollie Sails, MD;  Location: [facility] ENDOSCOPY;  Service: Endoscopy;  Laterality: N/A;   LOWER EXTREMITY ANGIOGRAPHY Right 05/28/2017   Procedure: Lower Extremity Angiography;  Surgeon: Katha Cabal, MD;  Location: [place] CV LAB;  Service: Cardiovascular;  Laterality: Right;   MASTOIDECTOMY     NECK SURGERY     PERIPHERAL VASCULAR CATHETERIZATION Left 05/29/2016   Procedure: Lower Extremity Angiography;  Surgeon: Katha Cabal, MD;  Location: [place] [facility];  Service: Cardiovascular;  Laterality: Left;   PERIPHERAL VASCULAR CATHETERIZATION  05/29/2016   Procedure: Lower Extremity Intervention;  Surgeon: Katha Cabal, MD;  Location: [place] CV LAB;  Service: Cardiovascular;;   TONSILLECTOMY      Social History Social History   Tobacco Use   Smoking status: Former    Packs/day: 0.50    Years: 45.00    Pack years: 22.50    Types: Cigarettes   Smokeless tobacco: Never  Vaping Use   Vaping Use: Never used  Substance Use Topics   Alcohol use: No  Drug use: No    Family History Family History  Problem Relation Age of Onset   COPD Mother    Hypertension Mother    COPD Father    Breast cancer Maternal Grandmother 24   Breast cancer Cousin 56       maternal side    No Known Allergies   REVIEW OF SYSTEMS (Negative unless checked)  Constitutional: [] Weight loss  [] Fever  [] Chills Cardiac: [] Chest pain   [] Chest pressure   [] Palpitations   [] Shortness of breath when laying flat   [] Shortness of breath with exertion. Vascular:  [] Pain in legs with walking   [] Pain  in legs at rest  [] History of DVT   [] Phlebitis   [] Swelling in legs   [] Varicose veins   [] Non-healing ulcers Pulmonary:   [] Uses home oxygen   [] Productive cough   [] Hemoptysis   [] Wheeze  [] COPD   [] Asthma Neurologic:  [] Dizziness   [] Seizures   [] History of stroke   [] History of TIA  [] Aphasia   [] Vissual changes   [] Weakness or numbness in arm   [] Weakness or numbness in leg Musculoskeletal:   [] Joint swelling   [] Joint pain   [] Low back pain Hematologic:  [] Easy bruising  [] Easy bleeding   [] Hypercoagulable state   [] Anemic Gastrointestinal:  [] Diarrhea   [] Vomiting  [] Gastroesophageal reflux/heartburn   [] Difficulty swallowing. Genitourinary:  [] Chronic kidney disease   [] Difficult urination  [] Frequent urination   [] Blood in urine Skin:  [] Rashes   [] Ulcers  Psychological:  [] History of anxiety   []  History of major depression.  Physical Examination  Vitals:   08/21/21 1557  BP: (!) 115/56  Pulse: 84  Resp: 15  Weight: 99 lb 12.8 oz (45.3 kg)   Body mass index is 17.13 kg/m. Gen: WD/WN, NAD Head: [place]/AT, No temporalis wasting.  Ear/Nose/Throat: Hearing grossly intact, nares w/o erythema or drainage Eyes: PER, EOMI, sclera nonicteric.  Neck: Supple, no masses.  No bruit or JVD.  Pulmonary:  Good air movement, no audible wheezing, no use of accessory muscles.  Cardiac: RRR, normal S1, S2, no Murmurs. Vascular:   bilateral carotid bruit Vessel Right Left  Radial Palpable Palpable  Carotid Palpable Palpable  PT Not Palpable Not Palpable  DP Not Palpable Not Palpable  Gastrointestinal: soft, non-distended. No guarding/no peritoneal signs.  Musculoskeletal: M/S 5/5 throughout.  No visible deformity.  Neurologic: CN 2-12 intact. Pain and light touch intact in extremities.  Symmetrical.  Speech is fluent. Motor exam as listed above. Psychiatric: Judgment intact, Mood & affect appropriate for pt's clinical situation. Dermatologic: No rashes or ulcers noted.  No changes consistent  with cellulitis.   CBC Lab Results  Component Value Date   WBC 12.5 (H) 04/04/2020   HGB 12.7 04/04/2020   HCT 37.9 04/04/2020   MCV 100.3 (H) 04/04/2020   PLT 216 04/04/2020    BMET    Component Value Date/Time   NA 133 (L) 04/04/2020 0552   K 4.9 04/04/2020 0552   CL 87 (L) 04/04/2020 0552   CO2 37 (H) 04/04/2020 0552   GLUCOSE 124 (H) 04/04/2020 0552   BUN 17 04/04/2020 0552   CREATININE 0.64 04/04/2020 0552   CALCIUM 7.8 (L) 04/04/2020 0552   GFRNONAA >60 04/04/2020 0552   GFRAA >60 04/04/2020 0552   CrCl cannot be calculated (Patient's most recent lab result is older than the maximum 21 days allowed.).  COAG No results found for: INR, PROTIME  Radiology VAS [place] ABI WITH/WO TBI  Result Date: 08/24/2021  LOWER EXTREMITY DOPPLER STUDY Patient Name:  Wendy Ferrell  Date of Exam:   08/21/2021 Medical Rec #: [id number]       Accession #:    [id number] Date of Birth: 06-Aug-1956        Patient Gender: F Patient Age:   68 years Exam Location:  [facility] Procedure:      VAS [place] ABI WITH/WO TBI Referring Phys: Hortencia Pilar --------------------------------------------------------------------------------  Indications: Peripheral artery disease.  Comparison Study: 07/29/2020 Performing Technologist: Almira Coaster RVS  Examination Guidelines: A complete evaluation includes at minimum, Doppler waveform signals and systolic blood pressure reading at the level of bilateral brachial, anterior tibial, and posterior tibial arteries, when vessel segments are accessible. Bilateral testing is considered an integral part of a complete examination. Photoelectric Plethysmograph (PPG) waveforms and toe systolic pressure readings are included as required and additional duplex testing as needed. Limited examinations for reoccurring indications may be performed as noted.  ABI Findings: +---------+------------------+-----+--------+--------+  Right     Rt Pressure  (mmHg) Index Waveform Comment   +---------+------------------+-----+--------+--------+  Brachial  135                                         +---------+------------------+-----+--------+--------+  ATA       124                      biphasic .92       +---------+------------------+-----+--------+--------+  PTA       120                0.89  biphasic           +---------+------------------+-----+--------+--------+  Great Toe 66                 0.49  Abnormal           +---------+------------------+-----+--------+--------+ +---------+------------------+-----+--------+-------+  Left      Lt Pressure (mmHg) Index Waveform Comment  +---------+------------------+-----+--------+-------+  Brachial  135                                        +---------+------------------+-----+--------+-------+  ATA       127                      biphasic .94      +---------+------------------+-----+--------+-------+  PTA       120                0.89  biphasic          +---------+------------------+-----+--------+-------+  Great Toe 60                 0.44  Abnormal          +---------+------------------+-----+--------+-------+ +-------+-----------+-----------+------------+------------+  ABI/TBI Today's ABI Today's TBI Previous ABI Previous TBI  +-------+-----------+-----------+------------+------------+  Right   .92         .49         1.0          .88           +-------+-----------+-----------+------------+------------+  Left    .94         .44         1.0          .76           +-------+-----------+-----------+------------+------------+  Bilateral ABIs and TBIs appear decreased compared to prior study on 07/29/2020.  Summary: Right: Resting right ankle-brachial index indicates mild right lower extremity arterial disease. The right toe-brachial index is abnormal. Left: Resting left ankle-brachial index indicates mild left lower extremity arterial disease. The left toe-brachial index is abnormal.  *See table(s) above for measurements and  observations.  Electronically signed by Hortencia Pilar MD on 08/24/2021 at 5:34:48 PM.    Final    VAS US CAROTID  Result Date: 08/24/2021 Carotid Arterial Duplex Study Patient Name:  FONTELLA SHAN  Date of Exam:   08/21/2021 Medical Rec #: [id number]       Accession #:    [id number] Date of Birth: Jan 11, 1956        Patient Gender: F Patient Age:   62 years Exam Location:  [facility] Procedure:      VAS US CAROTID Referring Phys: Eulogio Ditch --------------------------------------------------------------------------------  Indications:       Carotid artery disease. Comparison Study:  07/29/2020;Essentially unchanged compared to prior study. Performing Technologist: Almira Coaster RVS  Examination Guidelines: A complete evaluation includes B-mode imaging, spectral Doppler, color Doppler, and power Doppler as needed of all accessible portions of each vessel. Bilateral testing is considered an integral part of a complete examination. Limited examinations for reoccurring indications may be performed as noted.  Right Carotid Findings: +----------+--------+--------+--------+------------------+--------+             PSV cm/s EDV cm/s Stenosis Plaque Description Comments  +----------+--------+--------+--------+------------------+--------+  CCA Prox   45       12                                             +----------+--------+--------+--------+------------------+--------+  CCA Mid    46       7                                              +----------+--------+--------+--------+------------------+--------+  CCA Distal 35       10                                             +----------+--------+--------+--------+------------------+--------+  ICA Prox   271      48                                             +----------+--------+--------+--------+------------------+--------+  ICA Mid    140      23                                             +----------+--------+--------+--------+------------------+--------+   ICA Distal 92       19                                             +----------+--------+--------+--------+------------------+--------+  ECA        83       11                                             +----------+--------+--------+--------+------------------+--------+ +----------+--------+-------+--------+-------------------+             PSV cm/s EDV cms Describe Arm Pressure (mmHG)  +----------+--------+-------+--------+-------------------+  Subclavian 138      0                                     +----------+--------+-------+--------+-------------------+ +---------+--------+--+--------+--+  Vertebral PSV cm/s 46 EDV cm/s 14  +---------+--------+--+--------+--+  Left Carotid Findings: +----------+--------+--------+--------+------------------+--------+             PSV cm/s EDV cm/s Stenosis Plaque Description Comments  +----------+--------+--------+--------+------------------+--------+  CCA Prox   65       10                                             +----------+--------+--------+--------+------------------+--------+  CCA Mid    58       15                                             +----------+--------+--------+--------+------------------+--------+  CCA Distal 50       14                                             +----------+--------+--------+--------+------------------+--------+  ICA Prox   225      51                                             +----------+--------+--------+--------+------------------+--------+  ICA Mid    124      29                                             +----------+--------+--------+--------+------------------+--------+  ICA Distal 117      19                                             +----------+--------+--------+--------+------------------+--------+  ECA        163      13                                             +----------+--------+--------+--------+------------------+--------+ +----------+--------+--------+--------+-------------------+             PSV cm/s EDV  cm/s Describe Arm Pressure (mmHG)  +----------+--------+--------+--------+-------------------+  Subclavian 160  0                                      +----------+--------+--------+--------+-------------------+ +---------+--------+--+--------+--+  Vertebral PSV cm/s 74 EDV cm/s 14  +---------+--------+--+--------+--+   Summary: Right Carotid: Velocities in the right ICA are consistent with a 40-59%                stenosis. Left Carotid: Velocities in the left ICA are consistent with a 40-59% stenosis. Vertebrals:  Bilateral vertebral arteries demonstrate antegrade flow. Subclavians: Normal flow hemodynamics were seen in bilateral subclavian              arteries. *See table(s) above for measurements and observations.  Electronically signed by Hortencia Pilar MD on 08/24/2021 at 5:26:36 PM.    Final      Assessment/Plan 1. Atherosclerosis of native artery of both lower extremities with intermittent claudication (HCC) Recommend:   The patient has evidence of atherosclerosis of the lower extremities with claudication.  The patient does not voice lifestyle limiting changes at this point in time.   Noninvasive studies do not suggest clinically significant change.   No invasive studies, angiography or surgery at this time The patient should continue walking and begin a more formal exercise program.  The patient should continue antiplatelet therapy and aggressive treatment of the lipid abnormalities   No changes in the patient's medications at this time   The patient should continue wearing graduated compression socks 10-15 mmHg strength to control the mild edema.  - VAS [place] ABI WITH/WO TBI; Future  2. Bilateral carotid artery stenosis Recommend:   Given the patient's asymptomatic subcritical stenosis no further invasive testing or surgery at this time.   Duplex ultrasound shows 40-59% stenosis bilaterally.   Continue antiplatelet therapy as prescribed Continue management of CAD, HTN and  Hyperlipidemia Healthy heart diet,  encouraged exercise at least 4 times per week Follow up in 12 months with duplex ultrasound and physical exam   - VAS US CAROTID; Future  3. Chronic obstructive pulmonary disease, unspecified COPD type ([place]) Continue pulmonary medications and aerosols as already ordered, these medications have been reviewed and there are no changes at this time.    4. Mixed hyperlipidemia Continue statin as ordered and reviewed, no changes at this time     Hortencia Pilar, MD  08/26/2021 2:58 PM

## 2021-09-22 DIAGNOSIS — J449 Chronic obstructive pulmonary disease, unspecified: Secondary | ICD-10-CM | POA: Diagnosis not present

## 2021-10-23 DIAGNOSIS — J449 Chronic obstructive pulmonary disease, unspecified: Secondary | ICD-10-CM | POA: Diagnosis not present

## 2021-10-31 DIAGNOSIS — N189 Chronic kidney disease, unspecified: Secondary | ICD-10-CM | POA: Diagnosis not present

## 2021-10-31 DIAGNOSIS — R7303 Prediabetes: Secondary | ICD-10-CM | POA: Diagnosis not present

## 2021-10-31 DIAGNOSIS — I2510 Atherosclerotic heart disease of native coronary artery without angina pectoris: Secondary | ICD-10-CM | POA: Diagnosis not present

## 2021-11-03 DIAGNOSIS — I7389 Other specified peripheral vascular diseases: Secondary | ICD-10-CM | POA: Diagnosis not present

## 2021-11-03 DIAGNOSIS — G4733 Obstructive sleep apnea (adult) (pediatric): Secondary | ICD-10-CM | POA: Diagnosis not present

## 2021-11-03 DIAGNOSIS — N189 Chronic kidney disease, unspecified: Secondary | ICD-10-CM | POA: Diagnosis not present

## 2021-11-03 DIAGNOSIS — J9611 Chronic respiratory failure with hypoxia: Secondary | ICD-10-CM | POA: Diagnosis not present

## 2021-11-03 DIAGNOSIS — I719 Aortic aneurysm of unspecified site, without rupture: Secondary | ICD-10-CM | POA: Diagnosis not present

## 2021-11-03 DIAGNOSIS — I739 Peripheral vascular disease, unspecified: Secondary | ICD-10-CM | POA: Diagnosis not present

## 2021-11-03 DIAGNOSIS — D759 Disease of blood and blood-forming organs, unspecified: Secondary | ICD-10-CM | POA: Diagnosis not present

## 2021-11-20 DIAGNOSIS — J449 Chronic obstructive pulmonary disease, unspecified: Secondary | ICD-10-CM | POA: Diagnosis not present

## 2021-12-21 DIAGNOSIS — J449 Chronic obstructive pulmonary disease, unspecified: Secondary | ICD-10-CM | POA: Diagnosis not present

## 2022-01-20 DIAGNOSIS — J449 Chronic obstructive pulmonary disease, unspecified: Secondary | ICD-10-CM | POA: Diagnosis not present

## 2022-02-16 DIAGNOSIS — G4733 Obstructive sleep apnea (adult) (pediatric): Secondary | ICD-10-CM | POA: Diagnosis not present

## 2022-02-16 DIAGNOSIS — N189 Chronic kidney disease, unspecified: Secondary | ICD-10-CM | POA: Diagnosis not present

## 2022-02-16 DIAGNOSIS — I10 Essential (primary) hypertension: Secondary | ICD-10-CM | POA: Diagnosis not present

## 2022-02-16 DIAGNOSIS — D759 Disease of blood and blood-forming organs, unspecified: Secondary | ICD-10-CM | POA: Diagnosis not present

## 2022-02-16 DIAGNOSIS — J9611 Chronic respiratory failure with hypoxia: Secondary | ICD-10-CM | POA: Diagnosis not present

## 2022-02-16 DIAGNOSIS — I739 Peripheral vascular disease, unspecified: Secondary | ICD-10-CM | POA: Diagnosis not present

## 2022-02-20 DIAGNOSIS — J449 Chronic obstructive pulmonary disease, unspecified: Secondary | ICD-10-CM | POA: Diagnosis not present

## 2022-03-22 DIAGNOSIS — J449 Chronic obstructive pulmonary disease, unspecified: Secondary | ICD-10-CM | POA: Diagnosis not present

## 2022-04-16 ENCOUNTER — Other Ambulatory Visit: Payer: Self-pay | Admitting: Internal Medicine

## 2022-04-16 DIAGNOSIS — Z1231 Encounter for screening mammogram for malignant neoplasm of breast: Secondary | ICD-10-CM

## 2022-04-22 DIAGNOSIS — J449 Chronic obstructive pulmonary disease, unspecified: Secondary | ICD-10-CM | POA: Diagnosis not present

## 2022-05-09 ENCOUNTER — Ambulatory Visit
Admission: RE | Admit: 2022-05-09 | Discharge: 2022-05-09 | Disposition: A | Discharge status: Home / Self Care | Payer: Medicare Other | Source: Ambulatory Visit | Attending: Internal Medicine | Admitting: Internal Medicine

## 2022-05-09 DIAGNOSIS — Z1231 Encounter for screening mammogram for malignant neoplasm of breast: Secondary | ICD-10-CM | POA: Insufficient documentation

## 2022-05-23 DIAGNOSIS — J449 Chronic obstructive pulmonary disease, unspecified: Secondary | ICD-10-CM | POA: Diagnosis not present

## 2022-06-13 DIAGNOSIS — J9611 Chronic respiratory failure with hypoxia: Secondary | ICD-10-CM | POA: Diagnosis not present

## 2022-06-13 DIAGNOSIS — N189 Chronic kidney disease, unspecified: Secondary | ICD-10-CM | POA: Diagnosis not present

## 2022-06-13 DIAGNOSIS — I739 Peripheral vascular disease, unspecified: Secondary | ICD-10-CM | POA: Diagnosis not present

## 2022-06-13 DIAGNOSIS — I719 Aortic aneurysm of unspecified site, without rupture: Secondary | ICD-10-CM | POA: Diagnosis not present

## 2022-06-13 DIAGNOSIS — D759 Disease of blood and blood-forming organs, unspecified: Secondary | ICD-10-CM | POA: Diagnosis not present

## 2022-06-13 DIAGNOSIS — Z0001 Encounter for general adult medical examination with abnormal findings: Secondary | ICD-10-CM | POA: Diagnosis not present

## 2022-06-13 DIAGNOSIS — G4733 Obstructive sleep apnea (adult) (pediatric): Secondary | ICD-10-CM | POA: Diagnosis not present

## 2022-06-22 DIAGNOSIS — J449 Chronic obstructive pulmonary disease, unspecified: Secondary | ICD-10-CM | POA: Diagnosis not present

## 2022-06-28 DIAGNOSIS — R7303 Prediabetes: Secondary | ICD-10-CM | POA: Diagnosis not present

## 2022-06-28 DIAGNOSIS — I739 Peripheral vascular disease, unspecified: Secondary | ICD-10-CM | POA: Diagnosis not present

## 2022-06-28 DIAGNOSIS — I2510 Atherosclerotic heart disease of native coronary artery without angina pectoris: Secondary | ICD-10-CM | POA: Diagnosis not present

## 2022-06-28 DIAGNOSIS — I10 Essential (primary) hypertension: Secondary | ICD-10-CM | POA: Diagnosis not present

## 2022-07-03 DIAGNOSIS — Z23 Encounter for immunization: Secondary | ICD-10-CM | POA: Diagnosis not present

## 2022-07-03 DIAGNOSIS — N189 Chronic kidney disease, unspecified: Secondary | ICD-10-CM | POA: Diagnosis not present

## 2022-07-03 DIAGNOSIS — J9611 Chronic respiratory failure with hypoxia: Secondary | ICD-10-CM | POA: Diagnosis not present

## 2022-07-03 DIAGNOSIS — I739 Peripheral vascular disease, unspecified: Secondary | ICD-10-CM | POA: Diagnosis not present

## 2022-07-03 DIAGNOSIS — I10 Essential (primary) hypertension: Secondary | ICD-10-CM | POA: Diagnosis not present

## 2022-07-03 DIAGNOSIS — D759 Disease of blood and blood-forming organs, unspecified: Secondary | ICD-10-CM | POA: Diagnosis not present

## 2022-07-03 DIAGNOSIS — G4733 Obstructive sleep apnea (adult) (pediatric): Secondary | ICD-10-CM | POA: Diagnosis not present

## 2022-07-23 DIAGNOSIS — J449 Chronic obstructive pulmonary disease, unspecified: Secondary | ICD-10-CM | POA: Diagnosis not present

## 2022-08-22 DIAGNOSIS — E119 Type 2 diabetes mellitus without complications: Secondary | ICD-10-CM | POA: Insufficient documentation

## 2022-08-22 DIAGNOSIS — I10 Essential (primary) hypertension: Secondary | ICD-10-CM | POA: Insufficient documentation

## 2022-08-22 DIAGNOSIS — J449 Chronic obstructive pulmonary disease, unspecified: Secondary | ICD-10-CM | POA: Diagnosis not present

## 2022-08-22 NOTE — Progress Notes (Deleted)
MRN : [id number]  Wendy Ferrell is a 66 y.o. (08-16-1956) female who presents with chief complaint of check carotid arteries.  History of Present Illness: The patient returns to the office for followup and review of the noninvasive studies. There have been no interval changes in lower extremity symptoms. No interval shortening of the patient's claudication distance or development of rest pain symptoms. No new ulcers or wounds have occurred since the last visit.   The patient is also seen for follow up evaluation of carotid stenosis. The carotid stenosis followed by ultrasound.    The patient denies amaurosis fugax. There is no recent history of TIA symptoms or focal motor deficits. There is no prior documented CVA.   The patient is taking enteric-coated aspirin 81 mg daily.   There is no history of migraine headaches. There is no history of seizures.   There have been no significant changes to the patient's overall health care.   The patient denies history of DVT, PE or superficial thrombophlebitis.   The patient denies recent episodes of angina or shortness of breath.    ABI Rt=0.92 and Lt=0.94  (previous ABI's Rt=1.00 and Lt=1.00)   Previous duplex ultrasound of the aorta iliac arteries shows the stents are widely patent   Carotid Duplex done today shows RICA 16-01% and LICA 09-32%.  No change compared to last study in 07/29/2020    No outpatient medications have been marked as taking for the 08/23/22 encounter (Appointment) with Delana Meyer, Dolores Lory, MD.    Past Medical History:  Diagnosis Date   COPD (chronic obstructive pulmonary disease) ([place])    Hypertension    Nicotine addiction    Oxygen dependent    Peripheral vascular disease ([place])     Past Surgical History:  Procedure Laterality Date   ABDOMINAL HYSTERECTOMY     COLONOSCOPY WITH PROPOFOL N/A 02/21/2016   Procedure: COLONOSCOPY WITH PROPOFOL;  Surgeon: Lollie Sails, MD;  Location: [facility] ENDOSCOPY;   Service: Endoscopy;  Laterality: N/A;   LOWER EXTREMITY ANGIOGRAPHY Right 05/28/2017   Procedure: Lower Extremity Angiography;  Surgeon: Katha Cabal, MD;  Location: [place] [facility] LAB;  Service: Cardiovascular;  Laterality: Right;   MASTOIDECTOMY     NECK SURGERY     PERIPHERAL VASCULAR CATHETERIZATION Left 05/29/2016   Procedure: Lower Extremity Angiography;  Surgeon: Katha Cabal, MD;  Location: [place] [facility];  Service: Cardiovascular;  Laterality: Left;   PERIPHERAL VASCULAR CATHETERIZATION  05/29/2016   Procedure: Lower Extremity Intervention;  Surgeon: Katha Cabal, MD;  Location: [place] [facility];  Service: Cardiovascular;;   TONSILLECTOMY      Social History Social History   Tobacco Use   Smoking status: Former    Packs/day: 0.50    Years: 45.00    Total pack years: 22.50    Types: Cigarettes   Smokeless tobacco: Never  Vaping Use   Vaping Use: Never used  Substance Use Topics   Alcohol use: No   Drug use: No    Family History Family History  Problem Relation Age of Onset   COPD Mother    Hypertension Mother    COPD Father    Breast cancer Maternal Grandmother 41   Breast cancer Cousin 34       maternal side    No Known Allergies   REVIEW OF SYSTEMS (Negative unless checked)  Constitutional: '[]'$ Weight loss  '[]'$ Fever  '[]'$ Chills Cardiac: '[]'$ Chest pain   '[]'$ Chest pressure   '[]'$ Palpitations   '[]'$   Shortness of breath when laying flat   '[]'$ Shortness of breath with exertion. Vascular:  '[x]'$ Pain in legs with walking   '[]'$ Pain in legs at rest  '[]'$ History of DVT   '[]'$ Phlebitis   '[]'$ Swelling in legs   '[]'$ Varicose veins   '[]'$ Non-healing ulcers Pulmonary:   '[]'$ Uses home oxygen   '[]'$ Productive cough   '[]'$ Hemoptysis   '[]'$ Wheeze  '[x]'$ COPD   '[]'$ Asthma Neurologic:  '[]'$ Dizziness   '[]'$ Seizures   '[]'$ History of stroke   '[]'$ History of TIA  '[]'$ Aphasia   '[]'$ Vissual changes   '[]'$ Weakness or numbness in arm   '[]'$ Weakness or numbness in leg Musculoskeletal:   '[]'$ Joint swelling   '[]'$ Joint pain    '[]'$ Low back pain Hematologic:  '[]'$ Easy bruising  '[]'$ Easy bleeding   '[]'$ Hypercoagulable state   '[]'$ Anemic Gastrointestinal:  '[]'$ Diarrhea   '[]'$ Vomiting  '[]'$ Gastroesophageal reflux/heartburn   '[]'$ Difficulty swallowing. Genitourinary:  '[]'$ Chronic kidney disease   '[]'$ Difficult urination  '[]'$ Frequent urination   '[]'$ Blood in urine Skin:  '[]'$ Rashes   '[]'$ Ulcers  Psychological:  '[]'$ History of anxiety   '[]'$  History of major depression.  Physical Examination  There were no vitals filed for this visit. There is no height or weight on file to calculate BMI. Gen: WD/WN, NAD Head: [place]/AT, No temporalis wasting.  Ear/Nose/Throat: Hearing grossly intact, nares w/o erythema or drainage Eyes: PER, EOMI, sclera nonicteric.  Neck: Supple, no masses.  No bruit or JVD.  Pulmonary:  Good air movement, no audible wheezing, no use of accessory muscles.  Cardiac: RRR, normal S1, S2, no Murmurs. Vascular:  carotid bruit noted Vessel Right Left  Radial Palpable Palpable  Carotid  Palpable  Palpable  DP  Palpable Palpable  PT Palpable Palpable  Gastrointestinal: soft, non-distended. No guarding/no peritoneal signs.  Musculoskeletal: M/S 5/5 throughout.  No visible deformity.  Neurologic: CN 2-12 intact. Pain and light touch intact in extremities.  Symmetrical.  Speech is fluent. Motor exam as listed above. Psychiatric: Judgment intact, Mood & affect appropriate for pt's clinical situation. Dermatologic: No rashes or ulcers noted.  No changes consistent with cellulitis.   CBC Lab Results  Component Value Date   WBC 12.5 (H) 04/04/2020   HGB 12.7 04/04/2020   HCT 37.9 04/04/2020   MCV 100.3 (H) 04/04/2020   PLT 216 04/04/2020    BMET    Component Value Date/Time   NA 133 (L) 04/04/2020 0552   K 4.9 04/04/2020 0552   CL 87 (L) 04/04/2020 0552   CO2 37 (H) 04/04/2020 0552   GLUCOSE 124 (H) 04/04/2020 0552   BUN 17 04/04/2020 0552   CREATININE 0.64 04/04/2020 0552   CALCIUM 7.8 (L) 04/04/2020 0552   GFRNONAA >60  04/04/2020 0552   GFRAA >60 04/04/2020 0552   CrCl cannot be calculated (Patient's most recent lab result is older than the maximum 21 days allowed.).  COAG No results found for: "INR", "PROTIME"  Radiology No results found.   Assessment/Plan There are no diagnoses linked to this encounter.   Hortencia Pilar, MD  08/22/2022 4:57 PM

## 2022-08-23 ENCOUNTER — Ambulatory Visit (INDEPENDENT_AMBULATORY_CARE_PROVIDER_SITE_OTHER): Payer: Medicare Other | Admitting: Vascular Surgery

## 2022-08-23 ENCOUNTER — Encounter (INDEPENDENT_AMBULATORY_CARE_PROVIDER_SITE_OTHER): Payer: Medicare Other

## 2022-08-23 DIAGNOSIS — I70213 Atherosclerosis of native arteries of extremities with intermittent claudication, bilateral legs: Secondary | ICD-10-CM

## 2022-08-23 DIAGNOSIS — I6523 Occlusion and stenosis of bilateral carotid arteries: Secondary | ICD-10-CM

## 2022-08-23 DIAGNOSIS — J449 Chronic obstructive pulmonary disease, unspecified: Secondary | ICD-10-CM

## 2022-08-23 DIAGNOSIS — I10 Essential (primary) hypertension: Secondary | ICD-10-CM

## 2022-08-23 DIAGNOSIS — E1159 Type 2 diabetes mellitus with other circulatory complications: Secondary | ICD-10-CM

## 2022-08-23 DIAGNOSIS — E782 Mixed hyperlipidemia: Secondary | ICD-10-CM

## 2022-09-22 DIAGNOSIS — J449 Chronic obstructive pulmonary disease, unspecified: Secondary | ICD-10-CM | POA: Diagnosis not present

## 2022-10-17 ENCOUNTER — Ambulatory Visit: Payer: Medicare Other | Admitting: Internal Medicine

## 2022-10-17 ENCOUNTER — Other Ambulatory Visit: Payer: Self-pay | Admitting: Internal Medicine

## 2022-10-17 DIAGNOSIS — I10 Essential (primary) hypertension: Secondary | ICD-10-CM

## 2022-10-23 DIAGNOSIS — J449 Chronic obstructive pulmonary disease, unspecified: Secondary | ICD-10-CM | POA: Diagnosis not present

## 2022-10-24 ENCOUNTER — Ambulatory Visit: Payer: Medicare Other | Admitting: Internal Medicine

## 2022-10-24 ENCOUNTER — Encounter: Payer: Self-pay | Admitting: Internal Medicine

## 2022-10-24 ENCOUNTER — Ambulatory Visit (INDEPENDENT_AMBULATORY_CARE_PROVIDER_SITE_OTHER): Payer: Medicare Other | Admitting: Internal Medicine

## 2022-10-24 VITALS — BP 130/80 | HR 84 | Ht 64.0 in | Wt 101.0 lb

## 2022-10-24 DIAGNOSIS — I739 Peripheral vascular disease, unspecified: Secondary | ICD-10-CM | POA: Diagnosis not present

## 2022-10-24 DIAGNOSIS — E782 Mixed hyperlipidemia: Secondary | ICD-10-CM | POA: Diagnosis not present

## 2022-10-24 DIAGNOSIS — I10 Essential (primary) hypertension: Secondary | ICD-10-CM

## 2022-10-24 DIAGNOSIS — J449 Chronic obstructive pulmonary disease, unspecified: Secondary | ICD-10-CM | POA: Diagnosis not present

## 2022-10-24 DIAGNOSIS — J9611 Chronic respiratory failure with hypoxia: Secondary | ICD-10-CM

## 2022-10-24 NOTE — Progress Notes (Signed)
Established Patient Office Visit  Subjective:  Patient ID: Wendy Ferrell, female    DOB: 12-Mar-1956  Age: 67 y.o. MRN: [id number]  Chief Complaint  Patient presents with   Follow-up    3 mo fu    No new complaints, here for medication refills. Chronic dyspnea stable.     Past Medical History:  Diagnosis Date   COPD (chronic obstructive pulmonary disease) (HCC)    Hypertension    Nicotine addiction    Oxygen dependent    Peripheral vascular disease (HCC)     Social History   Socioeconomic History   Marital status: Legally Separated    Spouse name: Not on file   Number of children: Not on file   Years of education: Not on file   Highest education level: Not on file  Occupational History   Not on file  Tobacco Use   Smoking status: Former    Packs/day: 0.50    Years: 45.00    Total pack years: 22.50    Types: Cigarettes   Smokeless tobacco: Never  Vaping Use   Vaping Use: Never used  Substance and Sexual Activity   Alcohol use: No   Drug use: No   Sexual activity: Not on file  Other Topics Concern   Not on file  Social History Narrative   Not on file   Social Determinants of Health   Financial Resource Strain: Not on file  Food Insecurity: Not on file  Transportation Needs: Not on file  Physical Activity: Not on file  Stress: Not on file  Social Connections: Not on file  Intimate Partner Violence: Not on file    Family History  Problem Relation Age of Onset   COPD Mother    Hypertension Mother    COPD Father    Breast cancer Maternal Grandmother 94   Breast cancer Cousin 50       maternal side    No Known Allergies  Review of Systems  Constitutional: Negative.   HENT: Negative.    Eyes: Negative.   Respiratory:  Positive for wheezing.   Cardiovascular: Negative.   Gastrointestinal: Negative.   Genitourinary: Negative.   Skin: Negative.   Neurological: Negative.   Endo/Heme/Allergies: Negative.        Objective:   BP 130/80    Pulse 84   Ht 5' 4"$  (1.626 m)   Wt 101 lb (45.8 kg)   SpO2 95%   BMI 17.34 kg/m   Vitals:   10/24/22 1124  BP: 130/80  Pulse: 84  Height: 5' 4"$  (1.626 m)  Weight: 101 lb (45.8 kg)  SpO2: 95%  BMI (Calculated): 17.33    Physical Exam Vitals reviewed.  Constitutional:      General: She is not in acute distress. HENT:     Head: Normocephalic.     Nose: Nose normal.     Mouth/Throat:     Mouth: Mucous membranes are moist.  Eyes:     Extraocular Movements: Extraocular movements intact.     Pupils: Pupils are equal, round, and reactive to light.  Cardiovascular:     Rate and Rhythm: Normal rate and regular rhythm.     Heart sounds: No murmur heard. Pulmonary:     Effort: Pulmonary effort is normal. No respiratory distress.     Breath sounds: Decreased breath sounds present. No rhonchi or rales.     Comments: Marissa O2 at 3L/min Abdominal:     General: Abdomen is flat.  Palpations: There is no hepatomegaly, splenomegaly or mass.  Musculoskeletal:        General: Normal range of motion.     Cervical back: Normal range of motion. No tenderness.  Skin:    General: Skin is warm and dry.  Neurological:     General: No focal deficit present.     Mental Status: She is alert and oriented to person, place, and time.     Cranial Nerves: No cranial nerve deficit.     Motor: No weakness.  Psychiatric:        Mood and Affect: Mood normal.        Behavior: Behavior normal.      No results found for any visits on 10/24/22.  No results found for this or any previous visit (from the past 2160 hour(Navreet Bolda)).    Assessment & Plan:   Problem List Items Addressed This Visit   None 1. Essential hypertension  2. Chronic obstructive pulmonary disease, unspecified COPD type ([place])  3. Mixed hyperlipidemia  4. Chronic respiratory failure with hypoxia (HCC)  5. PAD (peripheral artery disease) (HCC) - Lipid panel - Comprehensive metabolic panel    No follow-ups on file.   Total  time spent: 20 minutes  Volanda Napoleon, MD  10/24/2022

## 2022-11-17 ENCOUNTER — Other Ambulatory Visit: Payer: Self-pay | Admitting: Internal Medicine

## 2022-11-17 DIAGNOSIS — I10 Essential (primary) hypertension: Secondary | ICD-10-CM

## 2022-11-21 ENCOUNTER — Other Ambulatory Visit (INDEPENDENT_AMBULATORY_CARE_PROVIDER_SITE_OTHER): Payer: Self-pay | Admitting: Vascular Surgery

## 2022-11-21 DIAGNOSIS — J449 Chronic obstructive pulmonary disease, unspecified: Secondary | ICD-10-CM | POA: Diagnosis not present

## 2022-11-21 DIAGNOSIS — I6523 Occlusion and stenosis of bilateral carotid arteries: Secondary | ICD-10-CM

## 2022-11-21 DIAGNOSIS — I70213 Atherosclerosis of native arteries of extremities with intermittent claudication, bilateral legs: Secondary | ICD-10-CM

## 2022-11-26 ENCOUNTER — Ambulatory Visit (INDEPENDENT_AMBULATORY_CARE_PROVIDER_SITE_OTHER): Payer: Medicare Other | Admitting: Vascular Surgery

## 2022-11-26 ENCOUNTER — Encounter (INDEPENDENT_AMBULATORY_CARE_PROVIDER_SITE_OTHER): Payer: Medicare Other

## 2022-12-22 DIAGNOSIS — J449 Chronic obstructive pulmonary disease, unspecified: Secondary | ICD-10-CM | POA: Diagnosis not present

## 2022-12-27 ENCOUNTER — Other Ambulatory Visit: Payer: Self-pay | Admitting: Internal Medicine

## 2022-12-27 DIAGNOSIS — I739 Peripheral vascular disease, unspecified: Secondary | ICD-10-CM

## 2023-01-21 ENCOUNTER — Other Ambulatory Visit: Payer: Self-pay | Admitting: Internal Medicine

## 2023-01-21 DIAGNOSIS — J449 Chronic obstructive pulmonary disease, unspecified: Secondary | ICD-10-CM | POA: Diagnosis not present

## 2023-01-22 ENCOUNTER — Ambulatory Visit: Payer: Medicare Other | Admitting: Internal Medicine

## 2023-01-30 ENCOUNTER — Ambulatory Visit: Payer: Medicare Other | Admitting: Internal Medicine

## 2023-02-10 IMAGING — MG MM DIGITAL SCREENING BILAT W/ TOMO AND CAD
6 of 12 series · 6 of 36 positions shown · non-contrast
Comparison: Previous exam(s).

CLINICAL DATA: Screening.

EXAM:
DIGITAL SCREENING BILATERAL MAMMOGRAM WITH TOMOSYNTHESIS AND CAD
TECHNIQUE: Bilateral screening digital craniocaudal and mediolateral oblique
mammograms were obtained. Bilateral screening digital breast
tomosynthesis was performed. The images were evaluated with
computer-aided detection.

[L MLO synth-2D (1 of 2)]
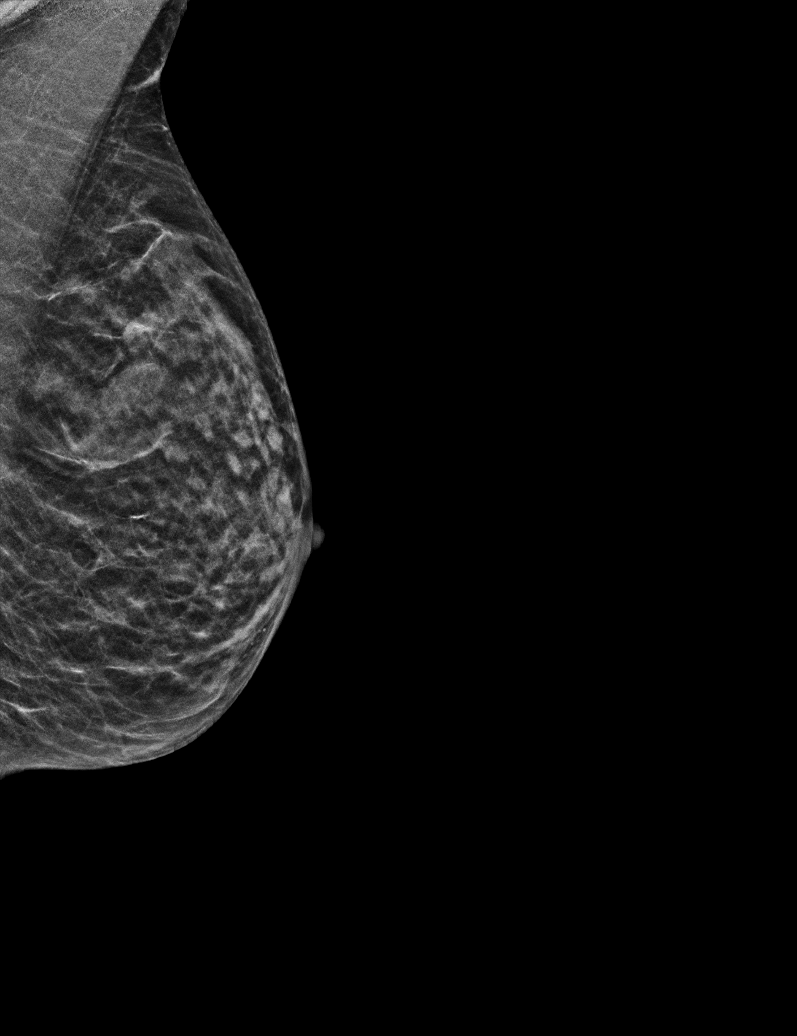

[R CC synth-2D]
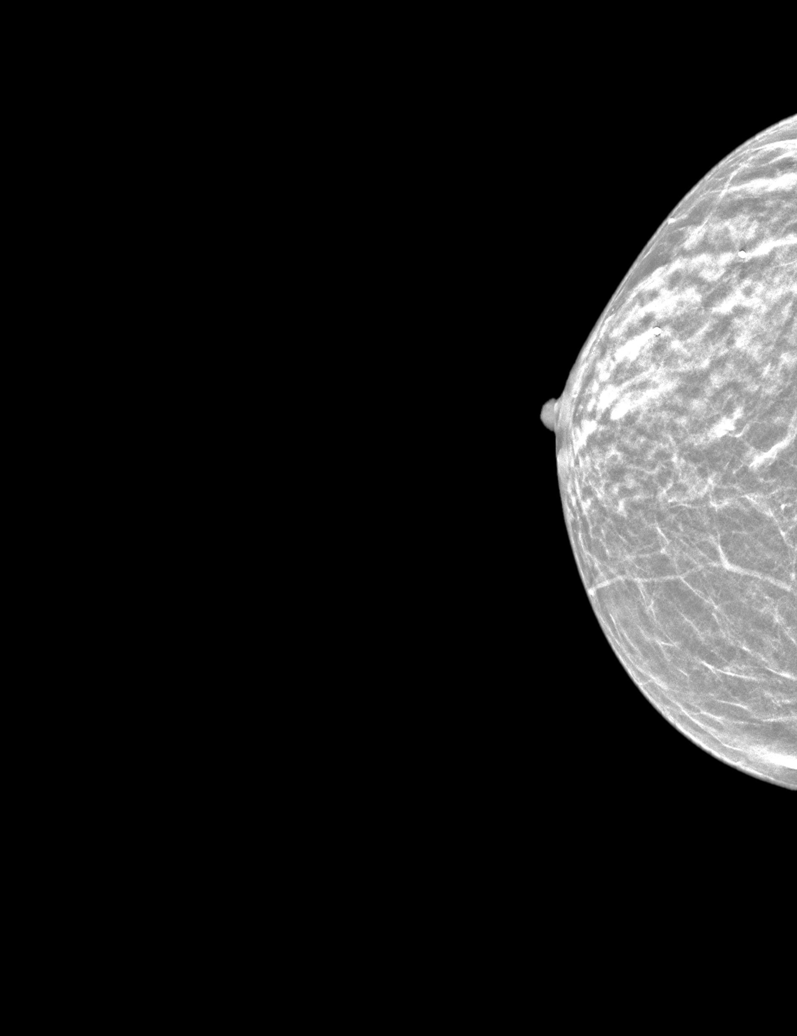

[L CC synth-2D]
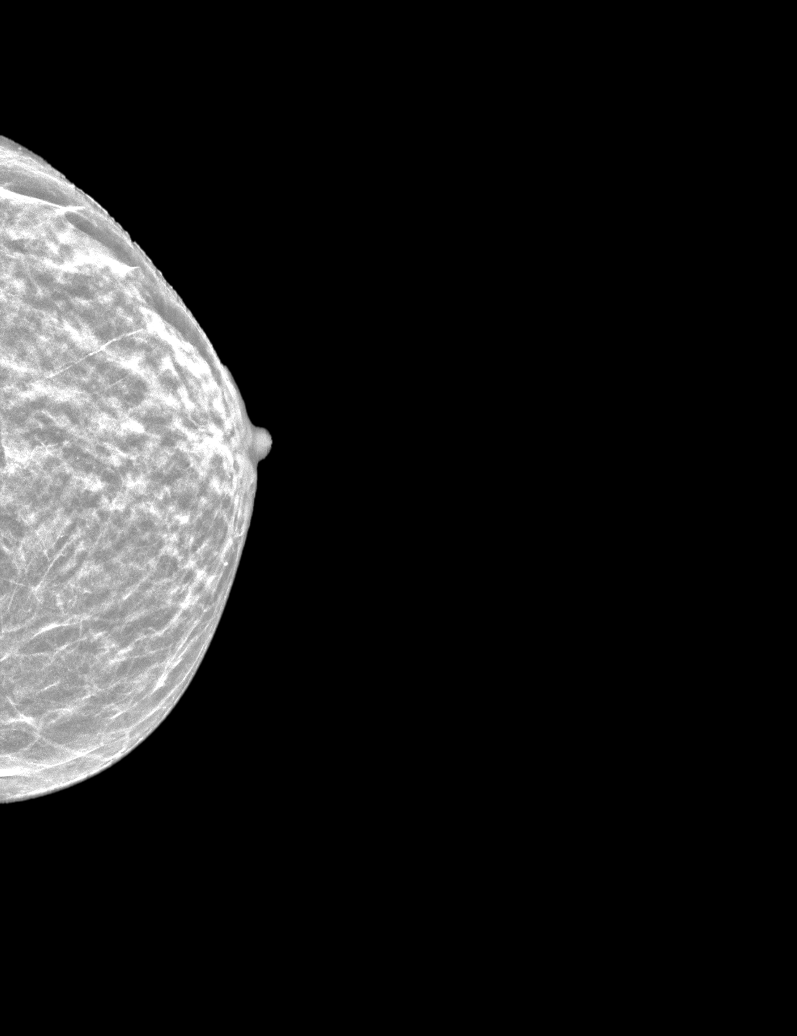

[L MLO synth-2D (2 of 2)]
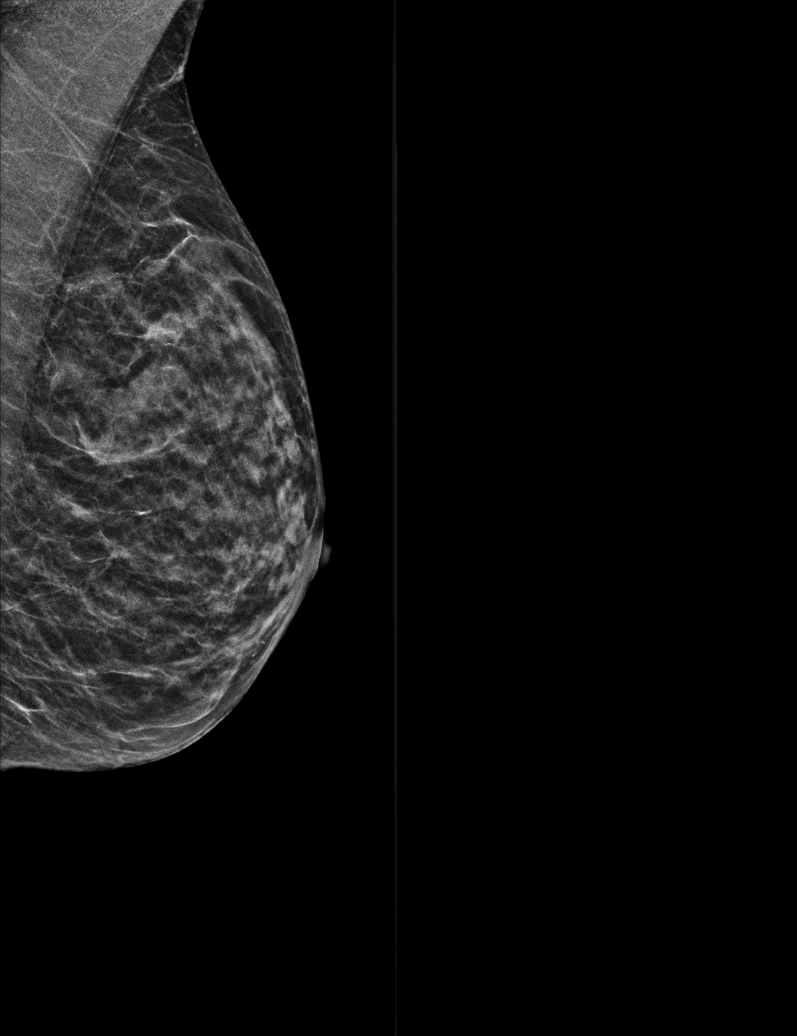

[R MLO synth-2D (1 of 2)]
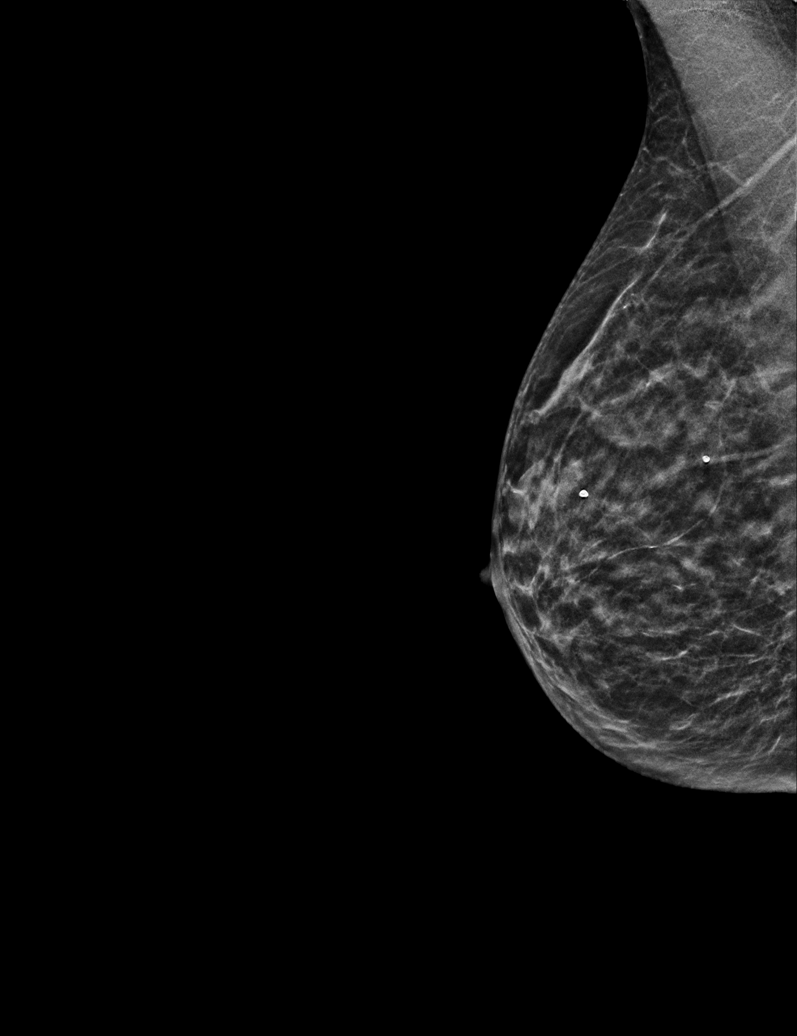

[R MLO synth-2D (2 of 2)]
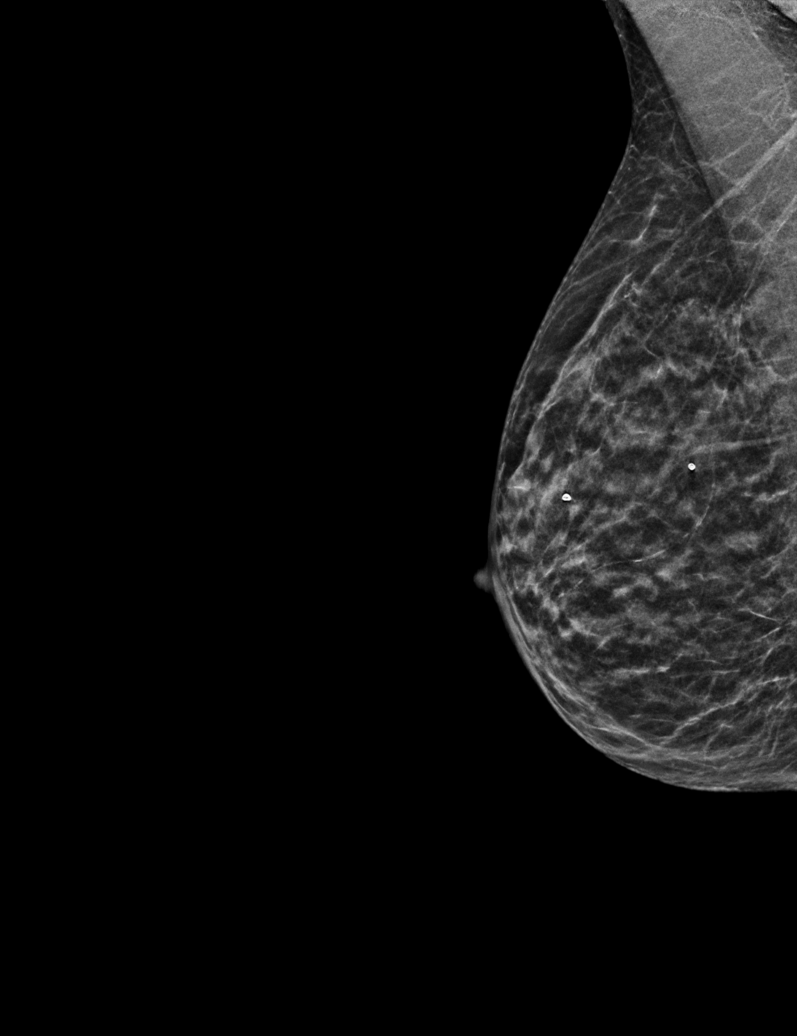

[6 of 36 positions shown; findings below may reference images not displayed]

ACR Breast Density Category c: The breast tissue is heterogeneously
dense, which may obscure small masses.
FINDINGS: There are no findings suspicious for malignancy. The images were
evaluated with computer-aided detection.
IMPRESSION: No mammographic evidence of malignancy. A result letter of this
screening mammogram will be mailed directly to the patient.

RECOMMENDATION:
Screening mammogram in one year. (Code:T4-5-GWO)

BI-RADS CATEGORY  1: Negative.

## 2023-02-11 ENCOUNTER — Ambulatory Visit: Payer: Medicare Other | Admitting: Internal Medicine

## 2023-02-15 ENCOUNTER — Other Ambulatory Visit: Payer: Self-pay | Admitting: Internal Medicine

## 2023-02-15 DIAGNOSIS — J449 Chronic obstructive pulmonary disease, unspecified: Secondary | ICD-10-CM

## 2023-02-15 DIAGNOSIS — I10 Essential (primary) hypertension: Secondary | ICD-10-CM

## 2023-02-21 DIAGNOSIS — J449 Chronic obstructive pulmonary disease, unspecified: Secondary | ICD-10-CM | POA: Diagnosis not present

## 2023-03-01 ENCOUNTER — Other Ambulatory Visit: Payer: Medicare Other

## 2023-03-01 ENCOUNTER — Ambulatory Visit: Payer: Medicare Other | Admitting: Internal Medicine

## 2023-03-01 DIAGNOSIS — I739 Peripheral vascular disease, unspecified: Secondary | ICD-10-CM | POA: Diagnosis not present

## 2023-03-01 DIAGNOSIS — E785 Hyperlipidemia, unspecified: Secondary | ICD-10-CM | POA: Diagnosis not present

## 2023-03-02 LAB — COMPREHENSIVE METABOLIC PANEL
ALT: 23 IU/L (ref 0–32)
AST: 22 IU/L (ref 0–40)
Albumin: 4.8 g/dL (ref 3.9–4.9)
Alkaline Phosphatase: 88 IU/L (ref 44–121)
BUN/Creatinine Ratio: 10 — ABNORMAL LOW (ref 12–28)
BUN: 7 mg/dL — ABNORMAL LOW (ref 8–27)
Bilirubin Total: 0.4 mg/dL (ref 0.0–1.2)
CO2: 33 mmol/L — ABNORMAL HIGH (ref 20–29)
Calcium: 9.6 mg/dL (ref 8.7–10.3)
Chloride: 84 mmol/L — ABNORMAL LOW (ref 96–106)
Creatinine, Ser: 0.71 mg/dL (ref 0.57–1.00)
Globulin, Total: 2.2 g/dL (ref 1.5–4.5)
Glucose: 126 mg/dL — ABNORMAL HIGH (ref 70–99)
Potassium: 4.6 mmol/L (ref 3.5–5.2)
Sodium: 132 mmol/L — ABNORMAL LOW (ref 134–144)
Total Protein: 7 g/dL (ref 6.0–8.5)
eGFR: 94 mL/min/{1.73_m2} (ref >59)

## 2023-03-02 LAB — LIPID PANEL
Chol/HDL Ratio: 1.5 ratio (ref 0.0–4.4)
Cholesterol, Total: 179 mg/dL (ref 100–199)
HDL: 123 mg/dL (ref >39)
LDL Chol Calc (NIH): 46 mg/dL (ref 0–99)
Triglycerides: 47 mg/dL (ref 0–149)
VLDL Cholesterol Cal: 10 mg/dL (ref 5–40)

## 2023-03-06 ENCOUNTER — Ambulatory Visit (INDEPENDENT_AMBULATORY_CARE_PROVIDER_SITE_OTHER): Payer: Medicare Other | Admitting: Internal Medicine

## 2023-03-06 VITALS — BP 110/80 | HR 64 | Ht 64.0 in | Wt 100.1 lb

## 2023-03-06 DIAGNOSIS — I10 Essential (primary) hypertension: Secondary | ICD-10-CM | POA: Diagnosis not present

## 2023-03-06 DIAGNOSIS — E782 Mixed hyperlipidemia: Secondary | ICD-10-CM

## 2023-03-06 DIAGNOSIS — J301 Allergic rhinitis due to pollen: Secondary | ICD-10-CM

## 2023-03-06 DIAGNOSIS — J9611 Chronic respiratory failure with hypoxia: Secondary | ICD-10-CM

## 2023-03-06 DIAGNOSIS — J449 Chronic obstructive pulmonary disease, unspecified: Secondary | ICD-10-CM | POA: Diagnosis not present

## 2023-03-06 MED ORDER — ATORVASTATIN CALCIUM 40 MG PO TABS
40.0000 mg | ORAL_TABLET | Freq: Every day | ORAL | 0 refills | Status: DC
Start: 2023-03-06 — End: 2023-06-14

## 2023-03-06 MED ORDER — FLUTICASONE PROPIONATE 50 MCG/ACT NA SUSP: 1.0000 | Freq: Every day | NASAL | 2 refills | Status: DC

## 2023-03-06 MED ORDER — CETIRIZINE HCL 10 MG PO TABS: 10.0000 mg | ORAL_TABLET | Freq: Every day | ORAL | 2 refills | Status: DC

## 2023-03-06 NOTE — Progress Notes (Signed)
Established Patient Office Visit  Subjective:  Patient ID: Wendy Ferrell, female    DOB: 03/14/1956  Age: 67 y.o. MRN: [id number]  Chief Complaint  Patient presents with   Follow-up    3 mo lab F/U    C/o worsening SOB x 5 days with increasing use of rescue inhalers. Associated cough with nasal congestion and postnasal drip worse at night. Labs reviewed and notable for elevated fasting glucose with well controlled lipids.    No other concerns at this time.   Past Medical History:  Diagnosis Date   COPD (chronic obstructive pulmonary disease) (HCC)    Hypertension    Nicotine addiction    Oxygen dependent    Peripheral vascular disease (HCC)     Past Surgical History:  Procedure Laterality Date   ABDOMINAL HYSTERECTOMY     COLONOSCOPY WITH PROPOFOL N/A 02/21/2016   Procedure: COLONOSCOPY WITH PROPOFOL;  Surgeon: Christena Deem, MD;  Location: [facility] ENDOSCOPY;  Service: Endoscopy;  Laterality: N/A;   LOWER EXTREMITY ANGIOGRAPHY Right 05/28/2017   Procedure: Lower Extremity Angiography;  Surgeon: Renford Dills, MD;  Location: [facility] INVASIVE CV LAB;  Service: Cardiovascular;  Laterality: Right;   MASTOIDECTOMY     NECK SURGERY     PERIPHERAL VASCULAR CATHETERIZATION Left 05/29/2016   Procedure: Lower Extremity Angiography;  Surgeon: Renford Dills, MD;  Location: [facility] INVASIVE CV LAB;  Service: Cardiovascular;  Laterality: Left;   PERIPHERAL VASCULAR CATHETERIZATION  05/29/2016   Procedure: Lower Extremity Intervention;  Surgeon: Renford Dills, MD;  Location: [facility] INVASIVE CV LAB;  Service: Cardiovascular;;   TONSILLECTOMY      Social History   Socioeconomic History   Marital status: Legally Separated    Spouse name: Not on file   Number of children: Not on file   Years of education: Not on file   Highest education level: Not on file  Occupational History   Not on file  Tobacco Use   Smoking status: Former    Packs/day: 0.50    Years: 45.00    Additional  pack years: 0.00    Total pack years: 22.50    Types: Cigarettes   Smokeless tobacco: Never  Vaping Use   Vaping Use: Never used  Substance and Sexual Activity   Alcohol use: No   Drug use: No   Sexual activity: Not on file  Other Topics Concern   Not on file  Social History Narrative   Not on file   Social Determinants of Health   Financial Resource Strain: Not on file  Food Insecurity: Not on file  Transportation Needs: Not on file  Physical Activity: Not on file  Stress: Not on file  Social Connections: Not on file  Intimate Partner Violence: Not on file    Family History  Problem Relation Age of Onset   COPD Mother    Hypertension Mother    COPD Father    Breast cancer Maternal Grandmother 68   Breast cancer Cousin 50       maternal side    No Known Allergies  Review of Systems  Constitutional: Negative.  Negative for chills, fever and weight loss.  HENT:  Positive for congestion. Negative for sore throat.   Eyes: Negative.   Respiratory:  Positive for wheezing.   Cardiovascular: Negative.   Gastrointestinal: Negative.   Genitourinary: Negative.   Skin: Negative.   Neurological: Negative.   Endo/Heme/Allergies: Negative.        Objective:   BP 110/80  Pulse 64   Ht 5\' 4"  (1.626 m)   Wt 100 lb 1.6 oz (45.4 kg)   SpO2 94%   BMI 17.18 kg/m   Vitals:   03/06/23 1113  BP: 110/80  Pulse: 64  Height: 5\' 4"  (1.626 m)  Weight: 100 lb 1.6 oz (45.4 kg)  SpO2: 94%  BMI (Calculated): 17.17    Physical Exam Vitals reviewed.  Constitutional:      General: She is not in acute distress. HENT:     Head: Normocephalic.     Comments: No sinus tenderness    Nose: Nose normal.     Mouth/Throat:     Mouth: Mucous membranes are moist.  Eyes:     Extraocular Movements: Extraocular movements intact.     Pupils: Pupils are equal, round, and reactive to light.  Cardiovascular:     Rate and Rhythm: Normal rate and regular rhythm.     Heart sounds: No  murmur heard. Pulmonary:     Effort: Pulmonary effort is normal.     Breath sounds: Decreased air movement present. No rhonchi or rales.  Abdominal:     General: Abdomen is flat.     Palpations: There is no hepatomegaly, splenomegaly or mass.  Musculoskeletal:        General: Normal range of motion.     Cervical back: Normal range of motion. No tenderness.  Skin:    General: Skin is warm and dry.  Neurological:     General: No focal deficit present.     Mental Status: She is alert and oriented to person, place, and time.     Cranial Nerves: No cranial nerve deficit.     Motor: No weakness.  Psychiatric:        Mood and Affect: Mood normal.        Behavior: Behavior normal.      No results found for any visits on 03/06/23.  Recent Results (from the past 2160 hour([place]us Ice))  Lipid panel     Status: None   Collection Time: 03/01/23 11:23 AM  Result Value Ref Range   Cholesterol, Total 179 100 - 199 mg/dL   Triglycerides 47 0 - 149 mg/dL   HDL 161 >09 mg/dL   VLDL Cholesterol Cal 10 5 - 40 mg/dL   LDL Chol Calc (NIH) 46 0 - 99 mg/dL   Chol/HDL Ratio 1.5 0.0 - 4.4 ratio    Comment:                                   T. Chol/HDL Ratio                                             Men  Women                               1/2 Avg.Risk  3.4    3.3                                   Avg.Risk  5.0    4.4  2X Avg.Risk  9.6    7.1                                3X Avg.Risk 23.4   11.0   Comprehensive metabolic panel     Status: Abnormal   Collection Time: 03/01/23 11:23 AM  Result Value Ref Range   Glucose 126 (H) 70 - 99 mg/dL   BUN 7 (L) 8 - 27 mg/dL   Creatinine, Ser 1.61 0.57 - 1.00 mg/dL   eGFR 94 >09 UE/AVW/0.98   BUN/Creatinine Ratio 10 (L) 12 - 28   Sodium 132 (L) 134 - 144 mmol/L   Potassium 4.6 3.5 - 5.2 mmol/L   Chloride 84 (L) 96 - 106 mmol/L   CO2 33 (H) 20 - 29 mmol/L   Calcium 9.6 8.7 - 10.3 mg/dL   Total Protein 7.0 6.0 - 8.5 g/dL    Albumin 4.8 3.9 - 4.9 g/dL   Globulin, Total 2.2 1.5 - 4.5 g/dL   Bilirubin Total 0.4 0.0 - 1.2 mg/dL   Alkaline Phosphatase 88 44 - 121 IU/L   AST 22 0 - 40 IU/L   ALT 23 0 - 32 IU/L      Assessment & Plan:  As per problem list  Problem List Items Addressed This Visit       Cardiovascular and Mediastinum   Essential hypertension   Relevant Medications   atorvastatin (LIPITOR) 40 MG tablet     Respiratory   COPD (chronic obstructive pulmonary disease) (HCC)   Relevant Medications   fluticasone (FLONASE) 50 MCG/ACT nasal spray   cetirizine (ZYRTEC ALLERGY) 10 MG tablet   Chronic respiratory failure with hypoxia (HCC)     Other   Hyperlipidemia   Relevant Medications   atorvastatin (LIPITOR) 40 MG tablet   Other Relevant Orders   Lipid panel   Comprehensive metabolic panel   Other Visit Diagnoses     Seasonal allergic rhinitis due to pollen    -  Primary   Relevant Medications   fluticasone (FLONASE) 50 MCG/ACT nasal spray   cetirizine (ZYRTEC ALLERGY) 10 MG tablet       Return for cpe with labs prior.   Total time spent: 30 minutes  Luna Fuse, MD  03/06/2023   This document may have been prepared by [facility] Voice Recognition software and as such may include unintentional dictation errors.

## 2023-03-12 ENCOUNTER — Other Ambulatory Visit: Payer: Self-pay | Admitting: Internal Medicine

## 2023-03-12 DIAGNOSIS — I10 Essential (primary) hypertension: Secondary | ICD-10-CM

## 2023-03-23 DIAGNOSIS — J449 Chronic obstructive pulmonary disease, unspecified: Secondary | ICD-10-CM | POA: Diagnosis not present

## 2023-04-23 DIAGNOSIS — J449 Chronic obstructive pulmonary disease, unspecified: Secondary | ICD-10-CM | POA: Diagnosis not present

## 2023-04-29 ENCOUNTER — Other Ambulatory Visit: Payer: Self-pay | Admitting: Internal Medicine

## 2023-05-24 DIAGNOSIS — J449 Chronic obstructive pulmonary disease, unspecified: Secondary | ICD-10-CM | POA: Diagnosis not present

## 2023-05-31 ENCOUNTER — Other Ambulatory Visit: Payer: Self-pay | Admitting: Internal Medicine

## 2023-05-31 DIAGNOSIS — J301 Allergic rhinitis due to pollen: Secondary | ICD-10-CM

## 2023-06-11 ENCOUNTER — Other Ambulatory Visit: Payer: Self-pay

## 2023-06-11 ENCOUNTER — Ambulatory Visit: Payer: Medicare Other | Admitting: Internal Medicine

## 2023-06-11 DIAGNOSIS — J449 Chronic obstructive pulmonary disease, unspecified: Secondary | ICD-10-CM

## 2023-06-11 MED ORDER — TRELEGY ELLIPTA 100-62.5-25 MCG/ACT IN AEPB: INHALATION_SPRAY | RESPIRATORY_TRACT | 1 refills | Status: DC

## 2023-06-14 ENCOUNTER — Other Ambulatory Visit: Payer: Self-pay | Admitting: Internal Medicine

## 2023-06-14 DIAGNOSIS — E782 Mixed hyperlipidemia: Secondary | ICD-10-CM

## 2023-06-23 DIAGNOSIS — J449 Chronic obstructive pulmonary disease, unspecified: Secondary | ICD-10-CM | POA: Diagnosis not present

## 2023-06-24 ENCOUNTER — Ambulatory Visit: Payer: Medicare Other | Admitting: Internal Medicine

## 2023-07-02 ENCOUNTER — Encounter: Payer: Self-pay | Admitting: Internal Medicine

## 2023-07-02 ENCOUNTER — Ambulatory Visit (INDEPENDENT_AMBULATORY_CARE_PROVIDER_SITE_OTHER): Payer: Medicare Other | Admitting: Internal Medicine

## 2023-07-02 VITALS — BP 130/72 | HR 83

## 2023-07-02 DIAGNOSIS — J9611 Chronic respiratory failure with hypoxia: Secondary | ICD-10-CM

## 2023-07-02 DIAGNOSIS — I491 Atrial premature depolarization: Secondary | ICD-10-CM

## 2023-07-02 DIAGNOSIS — I739 Peripheral vascular disease, unspecified: Secondary | ICD-10-CM | POA: Diagnosis not present

## 2023-07-02 DIAGNOSIS — J449 Chronic obstructive pulmonary disease, unspecified: Secondary | ICD-10-CM | POA: Diagnosis not present

## 2023-07-02 DIAGNOSIS — I70213 Atherosclerosis of native arteries of extremities with intermittent claudication, bilateral legs: Secondary | ICD-10-CM | POA: Diagnosis not present

## 2023-07-02 DIAGNOSIS — Z1389 Encounter for screening for other disorder: Secondary | ICD-10-CM | POA: Diagnosis not present

## 2023-07-02 DIAGNOSIS — Z1331 Encounter for screening for depression: Secondary | ICD-10-CM | POA: Diagnosis not present

## 2023-07-02 DIAGNOSIS — I10 Essential (primary) hypertension: Secondary | ICD-10-CM | POA: Diagnosis not present

## 2023-07-02 NOTE — Progress Notes (Signed)
Established Patient Office Visit  Subjective:  Patient ID: Wendy Ferrell, female    DOB: November 20, 1955  Age: 67 y.o. MRN: [id number]  No chief complaint on file.   No new complaints, here for CPE, lab review and medication refills. SOB has improved from last visit and oxygen requirements stable.    No other concerns at this time.   Past Medical History:  Diagnosis Date   COPD (chronic obstructive pulmonary disease) (HCC)    Hypertension    Nicotine addiction    Oxygen dependent    Peripheral vascular disease (HCC)     Past Surgical History:  Procedure Laterality Date   ABDOMINAL HYSTERECTOMY     COLONOSCOPY WITH PROPOFOL N/A 02/21/2016   Procedure: COLONOSCOPY WITH PROPOFOL;  Surgeon: Christena Deem, MD;  Location: [facility] ENDOSCOPY;  Service: Endoscopy;  Laterality: N/A;   LOWER EXTREMITY ANGIOGRAPHY Right 05/28/2017   Procedure: Lower Extremity Angiography;  Surgeon: Renford Dills, MD;  Location: [facility] INVASIVE CV LAB;  Service: Cardiovascular;  Laterality: Right;   MASTOIDECTOMY     NECK SURGERY     PERIPHERAL VASCULAR CATHETERIZATION Left 05/29/2016   Procedure: Lower Extremity Angiography;  Surgeon: Renford Dills, MD;  Location: [facility] INVASIVE CV LAB;  Service: Cardiovascular;  Laterality: Left;   PERIPHERAL VASCULAR CATHETERIZATION  05/29/2016   Procedure: Lower Extremity Intervention;  Surgeon: Renford Dills, MD;  Location: [facility] INVASIVE CV LAB;  Service: Cardiovascular;;   TONSILLECTOMY      Social History   Socioeconomic History   Marital status: Legally Separated    Spouse name: Not on file   Number of children: Not on file   Years of education: Not on file   Highest education level: Not on file  Occupational History   Not on file  Tobacco Use   Smoking status: Former    Current packs/day: 0.50    Average packs/day: 0.5 packs/day for 45.0 years (22.5 ttl pk-yrs)    Types: Cigarettes   Smokeless tobacco: Never  Vaping Use   Vaping status: Never  Used  Substance and Sexual Activity   Alcohol use: No   Drug use: No   Sexual activity: Not on file  Other Topics Concern   Not on file  Social History Narrative   Not on file   Social Determinants of Health   Financial Resource Strain: Not on file  Food Insecurity: Not on file  Transportation Needs: Not on file  Physical Activity: Not on file  Stress: Not on file  Social Connections: Not on file  Intimate Partner Violence: Not on file    Family History  Problem Relation Age of Onset   COPD Mother    Hypertension Mother    COPD Father    Breast cancer Maternal Grandmother 41   Breast cancer Cousin 50       maternal side    No Known Allergies  Review of Systems  Constitutional: Negative.  Negative for chills, fever and weight loss.  HENT:  Negative for sore throat.   Eyes: Negative.   Respiratory:  Positive for wheezing.   Cardiovascular:  Positive for palpitations.  Gastrointestinal: Negative.   Genitourinary: Negative.   Musculoskeletal: Negative.   Skin: Negative.   Neurological:  Positive for dizziness. Negative for headaches.  Endo/Heme/Allergies: Negative.   Psychiatric/Behavioral:  The patient does not have insomnia.        Objective:   BP 130/72   Pulse 83   SpO2 93%   Vitals:  07/02/23 1519  BP: 130/72  Pulse: 83  SpO2: 93%    Physical Exam Vitals reviewed.  Constitutional:      General: She is not in acute distress. HENT:     Head: Normocephalic.     Comments: No sinus tenderness    Nose: Nose normal.     Mouth/Throat:     Mouth: Mucous membranes are moist.  Eyes:     Extraocular Movements: Extraocular movements intact.     Pupils: Pupils are equal, round, and reactive to light.  Cardiovascular:     Rate and Rhythm: Normal rate. Rhythm irregular.     Heart sounds: No murmur heard. Pulmonary:     Effort: Pulmonary effort is normal.     Breath sounds: Decreased air movement present. Rhonchi present. No rales.  Abdominal:      General: Abdomen is flat.     Palpations: There is no hepatomegaly, splenomegaly or mass.  Musculoskeletal:        General: Normal range of motion.     Cervical back: Normal range of motion. No tenderness.  Skin:    General: Skin is warm and dry.  Neurological:     General: No focal deficit present.     Mental Status: She is alert and oriented to person, place, and time.     Cranial Nerves: No cranial nerve deficit.     Motor: No weakness.  Psychiatric:        Mood and Affect: Mood normal.        Behavior: Behavior normal.      No results found for any visits on 07/02/23.  No results found for this or any previous visit (from the past 2160 hour(Maurilio Puryear)).    Assessment & Plan:  As per problem list  Problem List Items Addressed This Visit       Cardiovascular and Mediastinum   PAD (peripheral artery disease) (HCC)   Atherosclerosis of native arteries of extremity with intermittent claudication (HCC)   Essential hypertension - Primary     Respiratory   COPD (chronic obstructive pulmonary disease) (HCC)   Chronic respiratory failure with hypoxia (HCC)   Other Visit Diagnoses     Premature atrial complexes       Relevant Orders   Ambulatory referral to Cardiology       Return in about 4 months (around 11/02/2023) for fu with labs prior.   Total time spent: 20 minutes  Luna Fuse, MD  07/02/2023   This document may have been prepared by [facility] Voice Recognition software and as such may include unintentional dictation errors.

## 2023-07-03 ENCOUNTER — Encounter: Payer: Self-pay | Admitting: Internal Medicine

## 2023-07-08 ENCOUNTER — Other Ambulatory Visit: Payer: Self-pay | Admitting: Internal Medicine

## 2023-07-08 DIAGNOSIS — E782 Mixed hyperlipidemia: Secondary | ICD-10-CM

## 2023-07-16 ENCOUNTER — Ambulatory Visit: Payer: Medicare Other | Admitting: Cardiovascular Disease

## 2023-07-16 ENCOUNTER — Encounter: Payer: Self-pay | Admitting: Cardiovascular Disease

## 2023-07-16 VITALS — BP 138/70 | HR 66 | Ht 64.0 in | Wt 106.0 lb

## 2023-07-16 DIAGNOSIS — I70213 Atherosclerosis of native arteries of extremities with intermittent claudication, bilateral legs: Secondary | ICD-10-CM

## 2023-07-16 DIAGNOSIS — E1159 Type 2 diabetes mellitus with other circulatory complications: Secondary | ICD-10-CM

## 2023-07-16 DIAGNOSIS — I739 Peripheral vascular disease, unspecified: Secondary | ICD-10-CM | POA: Diagnosis not present

## 2023-07-16 DIAGNOSIS — I4891 Unspecified atrial fibrillation: Secondary | ICD-10-CM | POA: Diagnosis not present

## 2023-07-16 DIAGNOSIS — E782 Mixed hyperlipidemia: Secondary | ICD-10-CM | POA: Diagnosis not present

## 2023-07-16 DIAGNOSIS — R0602 Shortness of breath: Secondary | ICD-10-CM

## 2023-07-16 DIAGNOSIS — I6523 Occlusion and stenosis of bilateral carotid arteries: Secondary | ICD-10-CM

## 2023-07-16 DIAGNOSIS — J441 Chronic obstructive pulmonary disease with (acute) exacerbation: Secondary | ICD-10-CM | POA: Diagnosis not present

## 2023-07-16 DIAGNOSIS — I10 Essential (primary) hypertension: Secondary | ICD-10-CM | POA: Diagnosis not present

## 2023-07-16 MED ORDER — APIXABAN 5 MG PO TABS: 5.0000 mg | ORAL_TABLET | Freq: Two times a day (BID) | ORAL | 2 refills | Status: DC

## 2023-07-16 NOTE — Progress Notes (Signed)
Cardiology Office Note   Date:  07/16/2023   ID:  Wendy Ferrell, DOB 07-Jun-1956, MRN [id number]  PCP:  Sherron Monday, MD  Cardiologist:  Adrian Blackwater, MD      History of Present Illness: Wendy Ferrell is a 67 y.o. female who presents for No chief complaint on file.   Shortness of Breath The current episode started more than 1 year ago. The problem has been waxing and waning. Pertinent negatives include no chest pain, leg swelling, orthopnea, PND or syncope. Her past medical history is significant for COPD.      Past Medical History:  Diagnosis Date   COPD (chronic obstructive pulmonary disease) (HCC)    Hypertension    Nicotine addiction    Oxygen dependent    Peripheral vascular disease (HCC)      Past Surgical History:  Procedure Laterality Date   ABDOMINAL HYSTERECTOMY     COLONOSCOPY WITH PROPOFOL N/A 02/21/2016   Procedure: COLONOSCOPY WITH PROPOFOL;  Surgeon: Christena Deem, MD;  Location: [facility] ENDOSCOPY;  Service: Endoscopy;  Laterality: N/A;   LOWER EXTREMITY ANGIOGRAPHY Right 05/28/2017   Procedure: Lower Extremity Angiography;  Surgeon: Renford Dills, MD;  Location: [facility] INVASIVE CV LAB;  Service: Cardiovascular;  Laterality: Right;   MASTOIDECTOMY     NECK SURGERY     PERIPHERAL VASCULAR CATHETERIZATION Left 05/29/2016   Procedure: Lower Extremity Angiography;  Surgeon: Renford Dills, MD;  Location: [facility] INVASIVE CV LAB;  Service: Cardiovascular;  Laterality: Left;   PERIPHERAL VASCULAR CATHETERIZATION  05/29/2016   Procedure: Lower Extremity Intervention;  Surgeon: Renford Dills, MD;  Location: [facility] INVASIVE CV LAB;  Service: Cardiovascular;;   TONSILLECTOMY       Current Outpatient Medications  Medication Sig Dispense Refill   apixaban (ELIQUIS) 5 MG TABS tablet Take 1 tablet (5 mg total) by mouth 2 (two) times daily. 60 tablet 2   atorvastatin (LIPITOR) 40 MG tablet TAKE 1 TABLET BY MOUTH EVERY DAY 90 tablet 0   cetirizine  (ZYRTEC) 10 MG tablet TAKE 1 TABLET BY MOUTH EVERY DAY 90 tablet 0   chlorthalidone (HYGROTON) 25 MG tablet USE AS DIRECTED TAKE HALF TAB DAILY 45 tablet 1   clopidogrel (PLAVIX) 75 MG tablet TAKE 1 TABLET BY MOUTH EVERY DAY 90 tablet 4   Fluticasone-Umeclidin-Vilant (TRELEGY ELLIPTA) 100-62.5-25 MCG/ACT AEPB USE AS DIRECTED 1 INHALATION PER DAY 180 each 1   Ipratropium-Albuterol (COMBIVENT RESPIMAT) 20-100 MCG/ACT AERS respimat INHALE 1 INHALATION EVERY 6 HOURS AS NEEDED 4 g 11   losartan (COZAAR) 25 MG tablet TAKE 1 TABLET BY MOUTH EVERY DAY 90 tablet 1   OXYGEN Inhale into the lungs.     amLODipine (NORVASC) 10 MG tablet Take 1 tablet (10 mg total) by mouth daily. 30 tablet 0   fluticasone (FLONASE) 50 MCG/ACT nasal spray Place 1 spray into both nostrils daily. 16 mL 2   No current facility-administered medications for this visit.    Allergies:   Patient has no known allergies.    Social History:   reports that she has quit smoking. Her smoking use included cigarettes. She has a 22.5 pack-year smoking history. She has never used smokeless tobacco. She reports that she does not drink alcohol and does not use drugs.   Family History:  family history includes Breast cancer (age of onset: 67) in her cousin; Breast cancer (age of onset: 3) in her maternal grandmother; COPD in her father and mother; Hypertension in her mother.  ROS:     Review of Systems  Constitutional: Negative.   HENT: Negative.    Eyes: Negative.   Respiratory:  Positive for shortness of breath.   Cardiovascular:  Negative for chest pain, orthopnea, leg swelling, syncope and PND.  Gastrointestinal: Negative.   Genitourinary: Negative.   Musculoskeletal: Negative.   Skin: Negative.   Neurological: Negative.   Endo/Heme/Allergies: Negative.   Psychiatric/Behavioral: Negative.    All other systems reviewed and are negative.     All other systems are reviewed and negative.    PHYSICAL EXAM: VS:  BP 138/70    Pulse 66   SpO2 96%  , BMI There is no height or weight on file to calculate BMI. Last weight:  Wt Readings from Last 3 Encounters:  03/06/23 100 lb 1.6 oz (45.4 kg)  10/24/22 101 lb (45.8 kg)  08/21/21 99 lb 12.8 oz (45.3 kg)     Physical Exam Constitutional:      Appearance: Normal appearance.  Cardiovascular:     Rate and Rhythm: Normal rate and regular rhythm.     Heart sounds: Normal heart sounds.  Pulmonary:     Effort: Pulmonary effort is normal.     Breath sounds: Normal breath sounds.  Musculoskeletal:     Right lower leg: No edema.     Left lower leg: No edema.  Neurological:     Mental Status: She is alert.       EKG:   Recent Labs: 03/01/2023: ALT 23; BUN 7; Creatinine, Ser 0.71; Potassium 4.6; Sodium 132    Lipid Panel    Component Value Date/Time   CHOL 179 03/01/2023 1123   TRIG 47 03/01/2023 1123   HDL 123 03/01/2023 1123   CHOLHDL 1.5 03/01/2023 1123   LDLCALC 46 03/01/2023 1123      Other studies Reviewed: Additional studies/ records that were reviewed today include:  Review of the above records demonstrates:       No data to display            ASSESSMENT AND PLAN:    ICD-10-CM   1. Atrial fibrillation by electrocardiogram (HCC)  I48.91 PCV ECHOCARDIOGRAM COMPLETE    MYOCARDIAL PERFUSION IMAGING    apixaban (ELIQUIS) 5 MG TABS tablet   Patinet is very SOB and in atrial fib by EKG RVR, advise elliquis and echo, stress test    2. PAD (peripheral artery disease) (HCC)  I73.9 PCV ECHOCARDIOGRAM COMPLETE    MYOCARDIAL PERFUSION IMAGING    apixaban (ELIQUIS) 5 MG TABS tablet   had stents in both lower extremities    3. Essential hypertension  I10 PCV ECHOCARDIOGRAM COMPLETE    MYOCARDIAL PERFUSION IMAGING    apixaban (ELIQUIS) 5 MG TABS tablet    4. Bilateral carotid artery stenosis  I65.23 PCV ECHOCARDIOGRAM COMPLETE    MYOCARDIAL PERFUSION IMAGING    apixaban (ELIQUIS) 5 MG TABS tablet    5. Atherosclerosis of native artery  of both lower extremities with intermittent claudication (HCC)  I70.213 PCV ECHOCARDIOGRAM COMPLETE    MYOCARDIAL PERFUSION IMAGING    apixaban (ELIQUIS) 5 MG TABS tablet    6. COPD with acute exacerbation (HCC)  J44.1 PCV ECHOCARDIOGRAM COMPLETE    MYOCARDIAL PERFUSION IMAGING    apixaban (ELIQUIS) 5 MG TABS tablet    7. Type 2 diabetes mellitus with other circulatory complication, unspecified whether long term insulin use (HCC)  E11.59 PCV ECHOCARDIOGRAM COMPLETE    MYOCARDIAL PERFUSION IMAGING    apixaban (ELIQUIS) 5 MG TABS  tablet    8. Mixed hyperlipidemia  E78.2 PCV ECHOCARDIOGRAM COMPLETE    MYOCARDIAL PERFUSION IMAGING    apixaban (ELIQUIS) 5 MG TABS tablet    9. SOB (shortness of breath)  R06.02 PCV ECHOCARDIOGRAM COMPLETE    MYOCARDIAL PERFUSION IMAGING    apixaban (ELIQUIS) 5 MG TABS tablet   ECHO/STRESS TEST       Problem List Items Addressed This Visit       Cardiovascular and Mediastinum   PAD (peripheral artery disease) (HCC)   Relevant Medications   apixaban (ELIQUIS) 5 MG TABS tablet   Other Relevant Orders   PCV ECHOCARDIOGRAM COMPLETE   MYOCARDIAL PERFUSION IMAGING   Bilateral carotid artery stenosis   Relevant Medications   apixaban (ELIQUIS) 5 MG TABS tablet   Other Relevant Orders   PCV ECHOCARDIOGRAM COMPLETE   MYOCARDIAL PERFUSION IMAGING   Atherosclerosis of native arteries of extremity with intermittent claudication (HCC)   Relevant Medications   apixaban (ELIQUIS) 5 MG TABS tablet   Other Relevant Orders   PCV ECHOCARDIOGRAM COMPLETE   MYOCARDIAL PERFUSION IMAGING   Essential hypertension   Relevant Medications   apixaban (ELIQUIS) 5 MG TABS tablet   Other Relevant Orders   PCV ECHOCARDIOGRAM COMPLETE   MYOCARDIAL PERFUSION IMAGING     Respiratory   COPD with acute exacerbation (HCC)   Relevant Medications   apixaban (ELIQUIS) 5 MG TABS tablet   Other Relevant Orders   PCV ECHOCARDIOGRAM COMPLETE   MYOCARDIAL PERFUSION IMAGING      Endocrine   Diabetes (HCC)   Relevant Medications   apixaban (ELIQUIS) 5 MG TABS tablet   Other Relevant Orders   PCV ECHOCARDIOGRAM COMPLETE   MYOCARDIAL PERFUSION IMAGING     Other   Hyperlipidemia   Relevant Medications   apixaban (ELIQUIS) 5 MG TABS tablet   Other Relevant Orders   PCV ECHOCARDIOGRAM COMPLETE   MYOCARDIAL PERFUSION IMAGING   Other Visit Diagnoses     Atrial fibrillation by electrocardiogram (HCC)    -  Primary   Patinet is very SOB and in atrial fib by EKG RVR, advise elliquis and echo, stress test   Relevant Medications   apixaban (ELIQUIS) 5 MG TABS tablet   Other Relevant Orders   PCV ECHOCARDIOGRAM COMPLETE   MYOCARDIAL PERFUSION IMAGING   SOB (shortness of breath)       ECHO/STRESS TEST   Relevant Medications   apixaban (ELIQUIS) 5 MG TABS tablet   Other Relevant Orders   PCV ECHOCARDIOGRAM COMPLETE   MYOCARDIAL PERFUSION IMAGING          Disposition:   Return in about 2 weeks (around 07/30/2023) for echo, stress test and f/u.    Total time spent: 50 minutes  Signed,  Adrian Blackwater, MD  07/16/2023 1:50 PM    [facility]

## 2023-07-18 ENCOUNTER — Telehealth: Payer: Self-pay

## 2023-07-18 NOTE — Telephone Encounter (Signed)
Patient called asking since she has recently started taking the eliquis, is the aspirin the only medication that she was supposed to stop taking ?

## 2023-07-23 ENCOUNTER — Ambulatory Visit (INDEPENDENT_AMBULATORY_CARE_PROVIDER_SITE_OTHER): Payer: Medicare Other

## 2023-07-23 DIAGNOSIS — I70213 Atherosclerosis of native arteries of extremities with intermittent claudication, bilateral legs: Secondary | ICD-10-CM

## 2023-07-23 DIAGNOSIS — I361 Nonrheumatic tricuspid (valve) insufficiency: Secondary | ICD-10-CM

## 2023-07-23 DIAGNOSIS — I351 Nonrheumatic aortic (valve) insufficiency: Secondary | ICD-10-CM

## 2023-07-23 DIAGNOSIS — R0602 Shortness of breath: Secondary | ICD-10-CM

## 2023-07-23 DIAGNOSIS — I6523 Occlusion and stenosis of bilateral carotid arteries: Secondary | ICD-10-CM

## 2023-07-23 DIAGNOSIS — E782 Mixed hyperlipidemia: Secondary | ICD-10-CM

## 2023-07-23 DIAGNOSIS — J441 Chronic obstructive pulmonary disease with (acute) exacerbation: Secondary | ICD-10-CM

## 2023-07-23 DIAGNOSIS — I10 Essential (primary) hypertension: Secondary | ICD-10-CM

## 2023-07-23 DIAGNOSIS — I4891 Unspecified atrial fibrillation: Secondary | ICD-10-CM

## 2023-07-23 DIAGNOSIS — E1159 Type 2 diabetes mellitus with other circulatory complications: Secondary | ICD-10-CM

## 2023-07-23 DIAGNOSIS — I739 Peripheral vascular disease, unspecified: Secondary | ICD-10-CM

## 2023-07-24 DIAGNOSIS — J449 Chronic obstructive pulmonary disease, unspecified: Secondary | ICD-10-CM | POA: Diagnosis not present

## 2023-07-30 ENCOUNTER — Ambulatory Visit: Payer: Medicare Other | Admitting: Cardiovascular Disease

## 2023-08-02 ENCOUNTER — Ambulatory Visit (INDEPENDENT_AMBULATORY_CARE_PROVIDER_SITE_OTHER): Payer: Medicare Other

## 2023-08-02 DIAGNOSIS — I70213 Atherosclerosis of native arteries of extremities with intermittent claudication, bilateral legs: Secondary | ICD-10-CM

## 2023-08-02 DIAGNOSIS — E1159 Type 2 diabetes mellitus with other circulatory complications: Secondary | ICD-10-CM | POA: Diagnosis not present

## 2023-08-02 DIAGNOSIS — I4891 Unspecified atrial fibrillation: Secondary | ICD-10-CM

## 2023-08-02 DIAGNOSIS — R0602 Shortness of breath: Secondary | ICD-10-CM

## 2023-08-02 DIAGNOSIS — I6523 Occlusion and stenosis of bilateral carotid arteries: Secondary | ICD-10-CM | POA: Diagnosis not present

## 2023-08-02 MED ORDER — TECHNETIUM TC 99M SESTAMIBI GENERIC - CARDIOLITE
9.5000 | Freq: Once | INTRAVENOUS | Status: AC | PRN
  Administered 2023-08-02: 9.5 via INTRAVENOUS

## 2023-08-02 MED ORDER — TECHNETIUM TC 99M SESTAMIBI GENERIC - CARDIOLITE
33.7000 | Freq: Once | INTRAVENOUS | Status: AC | PRN
  Administered 2023-08-02: 33.7 via INTRAVENOUS

## 2023-08-06 ENCOUNTER — Ambulatory Visit (INDEPENDENT_AMBULATORY_CARE_PROVIDER_SITE_OTHER): Payer: Medicare Other | Admitting: Cardiovascular Disease

## 2023-08-06 ENCOUNTER — Encounter: Payer: Self-pay | Admitting: Cardiovascular Disease

## 2023-08-06 VITALS — BP 128/82 | HR 60 | Ht 64.0 in | Wt 100.6 lb

## 2023-08-06 DIAGNOSIS — I739 Peripheral vascular disease, unspecified: Secondary | ICD-10-CM

## 2023-08-06 DIAGNOSIS — I70213 Atherosclerosis of native arteries of extremities with intermittent claudication, bilateral legs: Secondary | ICD-10-CM

## 2023-08-06 DIAGNOSIS — I6523 Occlusion and stenosis of bilateral carotid arteries: Secondary | ICD-10-CM

## 2023-08-06 DIAGNOSIS — R0602 Shortness of breath: Secondary | ICD-10-CM

## 2023-08-06 DIAGNOSIS — I10 Essential (primary) hypertension: Secondary | ICD-10-CM | POA: Diagnosis not present

## 2023-08-06 DIAGNOSIS — J441 Chronic obstructive pulmonary disease with (acute) exacerbation: Secondary | ICD-10-CM | POA: Diagnosis not present

## 2023-08-06 NOTE — Progress Notes (Signed)
Cardiology Office Note   Date:  08/06/2023   ID:  Wendy Ferrell, DOB March 15, 1956, MRN [id number]  PCP:  Sherron Monday, MD  Cardiologist:  Adrian Blackwater, MD      History of Present Illness: Wendy Ferrell is a 67 y.o. female who presents for No chief complaint on file.   Shortness of Breath This is a chronic problem. The current episode started more than 1 year ago. The problem has been gradually worsening. Associated symptoms include wheezing. Pertinent negatives include no chest pain, orthopnea or PND. The symptoms are aggravated by any activity. She has tried rest for the symptoms. The treatment provided moderate relief. Her past medical history is significant for COPD and a heart failure.      Past Medical History:  Diagnosis Date   COPD (chronic obstructive pulmonary disease) (HCC)    Hypertension    Nicotine addiction    Oxygen dependent    Peripheral vascular disease (HCC)      Past Surgical History:  Procedure Laterality Date   ABDOMINAL HYSTERECTOMY     COLONOSCOPY WITH PROPOFOL N/A 02/21/2016   Procedure: COLONOSCOPY WITH PROPOFOL;  Surgeon: Christena Deem, MD;  Location: [facility] ENDOSCOPY;  Service: Endoscopy;  Laterality: N/A;   LOWER EXTREMITY ANGIOGRAPHY Right 05/28/2017   Procedure: Lower Extremity Angiography;  Surgeon: Renford Dills, MD;  Location: [facility] INVASIVE CV LAB;  Service: Cardiovascular;  Laterality: Right;   MASTOIDECTOMY     NECK SURGERY     PERIPHERAL VASCULAR CATHETERIZATION Left 05/29/2016   Procedure: Lower Extremity Angiography;  Surgeon: Renford Dills, MD;  Location: [facility] INVAS[facility] CV [facility];  Service: Cardiovascular;  Laterality: Left;   PERIPHERAL VASCULAR CATHETERIZATION  05/29/2016   Procedure: Lower Extremity Intervention;  Surgeon: Renford Dills, MD;  Location: [facility] INVASIVE CV LAB;  Service: Cardiovascular;;   TONSILLECTOMY       Current Outpatient Medications  Medication Sig Dispense Refill   amLODipine (NORVASC) 10  MG tablet Take 1 tablet (10 mg total) by mouth daily. 30 tablet 0   apixaban (ELIQUIS) 5 MG TABS tablet Take 1 tablet (5 mg total) by mouth 2 (two) times daily. 60 tablet 2   atorvastatin (LIPITOR) 40 MG tablet TAKE 1 TABLET BY MOUTH EVERY DAY 90 tablet 0   cetirizine (ZYRTEC) 10 MG tablet TAKE 1 TABLET BY MOUTH EVERY DAY 90 tablet 0   chlorthalidone (HYGROTON) 25 MG tablet USE AS DIRECTED TAKE HALF TAB DAILY 45 tablet 1   clopidogrel (PLAVIX) 75 MG tablet TAKE 1 TABLET BY MOUTH EVERY DAY 90 tablet 4   fluticasone (FLONASE) 50 MCG/ACT nasal spray Place 1 spray into both nostrils daily. 16 mL 2   Fluticasone-Umeclidin-Vilant (TRELEGY ELLIPTA) 100-62.5-25 MCG/ACT AEPB USE AS DIRECTED 1 INHALATION PER DAY 180 each 1   Ipratropium-Albuterol (COMBIVENT RESPIMAT) 20-100 MCG/ACT AERS respimat INHALE 1 INHALATION EVERY 6 HOURS AS NEEDED 4 g 11   losartan (COZAAR) 25 MG tablet TAKE 1 TABLET BY MOUTH EVERY DAY 90 tablet 1   OXYGEN Inhale into the lungs.     No current facility-administered medications for this visit.    Allergies:   Patient has no known allergies.    Social History:   reports that she has quit smoking. Her smoking use included cigarettes. She has a 22.5 pack-year smoking history. She has never used smokeless tobacco. She reports that she does not drink alcohol and does not use drugs.   Family History:  family history includes Breast cancer (  age of onset: 46) in her cousin; Breast cancer (age of onset: 46) in her maternal grandmother; COPD in her father and mother; Hypertension in her mother.    ROS:     Review of Systems  Constitutional: Negative.   HENT: Negative.    Eyes: Negative.   Respiratory:  Positive for shortness of breath and wheezing.   Cardiovascular:  Negative for chest pain, orthopnea and PND.  Gastrointestinal: Negative.   Genitourinary: Negative.   Musculoskeletal: Negative.   Skin: Negative.   Neurological: Negative.   Endo/Heme/Allergies: Negative.    Psychiatric/Behavioral: Negative.    All other systems reviewed and are negative.     All other systems are reviewed and negative.    PHYSICAL EXAM: VS:  BP 128/82   Pulse 60   Ht 5\' 4"  (1.626 m)   Wt 100 lb 9.6 oz (45.6 kg)   SpO2 98%   BMI 17.27 kg/m  , BMI Body mass index is 17.27 kg/m. Last weight:  Wt Readings from Last 3 Encounters:  08/06/23 100 lb 9.6 oz (45.6 kg)  07/16/23 106 lb (48.1 kg)  03/06/23 100 lb 1.6 oz (45.4 kg)     Physical Exam Constitutional:      Appearance: Normal appearance.  Cardiovascular:     Rate and Rhythm: Normal rate and regular rhythm.     Heart sounds: Normal heart sounds.  Pulmonary:     Effort: Pulmonary effort is normal.     Breath sounds: Normal breath sounds.  Musculoskeletal:     Right lower leg: No edema.     Left lower leg: No edema.  Neurological:     Mental Status: She is alert.       EKG:   Recent Labs: 03/01/2023: ALT 23; BUN 7; Creatinine, Ser 0.71; Potassium 4.6; Sodium 132    Lipid Panel    Component Value Date/Time   CHOL 179 03/01/2023 1123   TRIG 47 03/01/2023 1123   HDL 123 03/01/2023 1123   CHOLHDL 1.5 03/01/2023 1123   LDLCALC 46 03/01/2023 1123      Other studies Reviewed: Additional studies/ records that were reviewed today include:  Review of the above records demonstrates:       No data to display            ASSESSMENT AND PLAN:    ICD-10-CM   1. PAD (peripheral artery disease) (HCC)  I73.9     2. Essential hypertension  I10     3. Bilateral carotid artery stenosis  I65.23     4. Atherosclerosis of native artery of both lower extremities with intermittent claudication (HCC)  I70.213     5. COPD with acute exacerbation (HCC)  J44.1    SOB due to COPD as LVEF normal    6. SOB (shortness of breath)  R06.02    stress test normal, echo, normal LVEF mild MR.       Problem List Items Addressed This Visit       Cardiovascular and Mediastinum   PAD (peripheral artery  disease) (HCC) - Primary   Bilateral carotid artery stenosis   Atherosclerosis of native arteries of extremity with intermittent claudication (HCC)   Essential hypertension     Respiratory   COPD with acute exacerbation (HCC)   Other Visit Diagnoses     SOB (shortness of breath)       stress test normal, echo, normal LVEF mild MR.          Disposition:   Return  in about 3 months (around 11/06/2023).    Total time spent: 30 minutes  Signed,  Adrian Blackwater, MD  08/06/2023 1:30 PM    [facility]

## 2023-08-23 DIAGNOSIS — J449 Chronic obstructive pulmonary disease, unspecified: Secondary | ICD-10-CM | POA: Diagnosis not present

## 2023-08-28 ENCOUNTER — Other Ambulatory Visit: Payer: Self-pay | Admitting: Internal Medicine

## 2023-08-28 DIAGNOSIS — J301 Allergic rhinitis due to pollen: Secondary | ICD-10-CM

## 2023-09-20 ENCOUNTER — Other Ambulatory Visit: Payer: Self-pay | Admitting: Internal Medicine

## 2023-09-20 DIAGNOSIS — J301 Allergic rhinitis due to pollen: Secondary | ICD-10-CM

## 2023-09-23 DIAGNOSIS — J449 Chronic obstructive pulmonary disease, unspecified: Secondary | ICD-10-CM | POA: Diagnosis not present

## 2023-10-15 ENCOUNTER — Other Ambulatory Visit: Payer: Self-pay | Admitting: Internal Medicine

## 2023-10-15 DIAGNOSIS — I10 Essential (primary) hypertension: Secondary | ICD-10-CM

## 2023-10-16 ENCOUNTER — Other Ambulatory Visit: Payer: Self-pay | Admitting: Internal Medicine

## 2023-10-16 ENCOUNTER — Other Ambulatory Visit: Payer: Self-pay | Admitting: Cardiovascular Disease

## 2023-10-16 DIAGNOSIS — I739 Peripheral vascular disease, unspecified: Secondary | ICD-10-CM

## 2023-10-16 DIAGNOSIS — R0602 Shortness of breath: Secondary | ICD-10-CM

## 2023-10-16 DIAGNOSIS — I6523 Occlusion and stenosis of bilateral carotid arteries: Secondary | ICD-10-CM

## 2023-10-16 DIAGNOSIS — J441 Chronic obstructive pulmonary disease with (acute) exacerbation: Secondary | ICD-10-CM

## 2023-10-16 DIAGNOSIS — E782 Mixed hyperlipidemia: Secondary | ICD-10-CM

## 2023-10-16 DIAGNOSIS — I70213 Atherosclerosis of native arteries of extremities with intermittent claudication, bilateral legs: Secondary | ICD-10-CM

## 2023-10-16 DIAGNOSIS — I10 Essential (primary) hypertension: Secondary | ICD-10-CM

## 2023-10-16 DIAGNOSIS — E1159 Type 2 diabetes mellitus with other circulatory complications: Secondary | ICD-10-CM

## 2023-10-16 DIAGNOSIS — I4891 Unspecified atrial fibrillation: Secondary | ICD-10-CM

## 2023-10-24 DIAGNOSIS — J449 Chronic obstructive pulmonary disease, unspecified: Secondary | ICD-10-CM | POA: Diagnosis not present

## 2023-11-01 ENCOUNTER — Ambulatory Visit: Payer: Medicare Other | Admitting: Internal Medicine

## 2023-11-07 ENCOUNTER — Ambulatory Visit: Payer: Medicare Other | Admitting: Cardiovascular Disease

## 2023-11-08 ENCOUNTER — Ambulatory Visit: Payer: Medicare Other | Admitting: Internal Medicine

## 2023-11-08 ENCOUNTER — Encounter: Payer: Self-pay | Admitting: Internal Medicine

## 2023-11-08 VITALS — BP 118/70 | HR 94 | Temp 97.7°F | Ht 64.0 in | Wt 95.0 lb

## 2023-11-08 DIAGNOSIS — J441 Chronic obstructive pulmonary disease with (acute) exacerbation: Secondary | ICD-10-CM

## 2023-11-08 DIAGNOSIS — R051 Acute cough: Secondary | ICD-10-CM | POA: Diagnosis not present

## 2023-11-08 DIAGNOSIS — J301 Allergic rhinitis due to pollen: Secondary | ICD-10-CM

## 2023-11-08 DIAGNOSIS — E782 Mixed hyperlipidemia: Secondary | ICD-10-CM | POA: Diagnosis not present

## 2023-11-08 MED ORDER — BENZONATATE 100 MG PO CAPS: 100.0000 mg | ORAL_CAPSULE | Freq: Three times a day (TID) | ORAL | 0 refills | Status: AC | PRN

## 2023-11-08 MED ORDER — FLUTICASONE PROPIONATE 50 MCG/ACT NA SUSP
1.0000 | Freq: Every day | NASAL | 2 refills | Status: AC
Start: 2023-11-08 — End: 2024-02-06

## 2023-11-08 MED ORDER — SULFAMETHOXAZOLE-TRIMETHOPRIM 800-160 MG PO TABS: 1.0000 | ORAL_TABLET | Freq: Two times a day (BID) | ORAL | 0 refills | Status: AC

## 2023-11-08 MED ORDER — COMBIVENT RESPIMAT 20-100 MCG/ACT IN AERS: 1.0000 | INHALATION_SPRAY | Freq: Four times a day (QID) | RESPIRATORY_TRACT | 5 refills | Status: DC | PRN

## 2023-11-08 MED ORDER — PREDNISONE 20 MG PO TABS: 40.0000 mg | ORAL_TABLET | Freq: Every day | ORAL | 0 refills | Status: AC

## 2023-11-08 MED ORDER — ATORVASTATIN CALCIUM 40 MG PO TABS: 40.0000 mg | ORAL_TABLET | Freq: Every day | ORAL | 0 refills | Status: DC

## 2023-11-08 NOTE — Progress Notes (Signed)
 Established Patient Office Visit  Subjective:  Patient ID: Wendy Ferrell, female    DOB: 02-21-1956  Age: 68 y.o. MRN: [id number]  No chief complaint on file.   C/o unproductive cough x 1 wk and increasing SOB and has increased frequency of MDI. Denies fever or chills.     No other concerns at this time.   Past Medical History:  Diagnosis Date   COPD (chronic obstructive pulmonary disease) (HCC)    Hypertension    Nicotine addiction    Oxygen dependent    Peripheral vascular disease (HCC)     Past Surgical History:  Procedure Laterality Date   ABDOMINAL HYSTERECTOMY     COLONOSCOPY WITH PROPOFOL N/A 02/21/2016   Procedure: COLONOSCOPY WITH PROPOFOL;  Surgeon: Christena Deem, MD;  Location: [facility] ENDOSCOPY;  Service: Endoscopy;  Laterality: N/A;   LOWER EXTREMITY ANGIOGRAPHY Right 05/28/2017   Procedure: Lower Extremity Angiography;  Surgeon: Renford Dills, MD;  Location: [facility] INVASIVE CV LAB;  Service: Cardiovascular;  Laterality: Right;   MASTOIDECTOMY     NECK SURGERY     PERIPHERAL VASCULAR CATHETERIZATION Left 05/29/2016   Procedure: Lower Extremity Angiography;  Surgeon: Renford Dills, MD;  Location: [facility] INVASIVE CV LAB;  Service: Cardiovascular;  Laterality: Left;   PERIPHERAL VASCULAR CATHETERIZATION  05/29/2016   Procedure: Lower Extremity Intervention;  Surgeon: Renford Dills, MD;  Location: [facility] INVASIVE CV LAB;  Service: Cardiovascular;;   TONSILLECTOMY      Social History   Socioeconomic History   Marital status: Legally Separated    Spouse name: Not on file   Number of children: Not on file   Years of education: Not on file   Highest education level: Not on file  Occupational History   Not on file  Tobacco Use   Smoking status: Former    Current packs/day: 0.50    Average packs/day: 0.5 packs/day for 45.0 years (22.5 ttl pk-yrs)    Types: Cigarettes   Smokeless tobacco: Never  Vaping Use   Vaping status: Never Used  Substance and  Sexual Activity   Alcohol use: No   Drug use: No   Sexual activity: Not on file  Other Topics Concern   Not on file  Social History Narrative   Not on file   Social Drivers of Health   Financial Resource Strain: Not on file  Food Insecurity: Not on file  Transportation Needs: Not on file  Physical Activity: Not on file  Stress: Not on file  Social Connections: Not on file  Intimate Partner Violence: Not on file    Family History  Problem Relation Age of Onset   COPD Mother    Hypertension Mother    COPD Father    Breast cancer Maternal Grandmother 57   Breast cancer Cousin 50       maternal side    No Known Allergies  Outpatient Medications Prior to Visit  Medication Sig   amLODipine (NORVASC) 10 MG tablet Take 1 tablet (10 mg total) by mouth daily.   cetirizine (ZYRTEC) 10 MG tablet TAKE 1 TABLET BY MOUTH EVERY DAY   chlorthalidone (HYGROTON) 25 MG tablet TAKE 1/2 TABLET BY MOUTH DAILY AS DIRECTED   clopidogrel (PLAVIX) 75 MG tablet TAKE 1 TABLET BY MOUTH EVERY DAY   ELIQUIS 5 MG TABS tablet TAKE 1 TABLET BY MOUTH TWICE A DAY   Fluticasone-Umeclidin-Vilant (TRELEGY ELLIPTA) 100-62.5-25 MCG/ACT AEPB USE AS DIRECTED 1 INHALATION PER DAY   losartan (COZAAR) 25 MG  tablet TAKE 1 TABLET BY MOUTH EVERY DAY   OXYGEN Inhale into the lungs.   [DISCONTINUED] atorvastatin (LIPITOR) 40 MG tablet TAKE 1 TABLET BY MOUTH EVERY DAY   [DISCONTINUED] fluticasone (FLONASE) 50 MCG/ACT nasal spray Place 1 spray into both nostrils daily.   [DISCONTINUED] Ipratropium-Albuterol (COMBIVENT RESPIMAT) 20-100 MCG/ACT AERS respimat INHALE 1 INHALATION EVERY 6 HOURS AS NEEDED   No facility-administered medications prior to visit.    Review of Systems  Constitutional: Negative.  Negative for chills, fever and weight loss.  HENT:  Negative for sore throat.   Eyes: Negative.   Respiratory:  Positive for wheezing.   Cardiovascular:  Positive for palpitations.  Gastrointestinal: Negative.    Genitourinary: Negative.   Musculoskeletal: Negative.   Skin: Negative.   Neurological:  Positive for dizziness. Negative for headaches.  Endo/Heme/Allergies: Negative.   Psychiatric/Behavioral:  The patient does not have insomnia.        Objective:   BP 118/70   Pulse 94   Temp 97.7 F (36.5 C)   Ht 5\' 4"  (1.626 m)   Wt 95 lb (43.1 kg)   SpO2 95%   BMI 16.31 kg/m   Vitals:   11/08/23 1446  BP: 118/70  Pulse: 94  Temp: 97.7 F (36.5 C)  Height: 5\' 4"  (1.626 m)  Weight: 95 lb (43.1 kg)  SpO2: 95%  BMI (Calculated): 16.3    Physical Exam Vitals reviewed.  Constitutional:      General: She is not in acute distress. HENT:     Head: Normocephalic.     Comments: No sinus tenderness    Nose: Nose normal.     Mouth/Throat:     Mouth: Mucous membranes are moist.  Eyes:     Extraocular Movements: Extraocular movements intact.     Pupils: Pupils are equal, round, and reactive to light.  Cardiovascular:     Rate and Rhythm: Normal rate. Rhythm irregular.     Heart sounds: No murmur heard. Pulmonary:     Effort: Pulmonary effort is normal.     Breath sounds: Decreased air movement present. Examination of the right-upper field reveals rhonchi. Examination of the right-lower field reveals rhonchi. Examination of the left-lower field reveals rhonchi. Rhonchi present. No rales.  Abdominal:     General: Abdomen is flat.     Palpations: There is no hepatomegaly, splenomegaly or mass.  Musculoskeletal:        General: Normal range of motion.     Cervical back: Normal range of motion. No tenderness.  Skin:    General: Skin is warm and dry.  Neurological:     General: No focal deficit present.     Mental Status: She is alert and oriented to person, place, and time.     Cranial Nerves: No cranial nerve deficit.     Motor: No weakness.  Psychiatric:        Mood and Affect: Mood normal.        Behavior: Behavior normal.      No results found for any visits on  11/08/23.  No results found for this or any previous visit (from the past 2160 hours).    Assessment & Plan:  As per problem list  Problem List Items Addressed This Visit       Respiratory   COPD with acute exacerbation (HCC) - Primary   Relevant Medications   sulfamethoxazole-trimethoprim (BACTRIM DS) 800-160 MG tablet   predniSONE (DELTASONE) 20 MG tablet   fluticasone (FLONASE) 50 MCG/ACT nasal  spray   Ipratropium-Albuterol (COMBIVENT RESPIMAT) 20-100 MCG/ACT AERS respimat   benzonatate (TESSALON PERLES) 100 MG capsule     Other   Hyperlipidemia   Relevant Medications   atorvastatin (LIPITOR) 40 MG tablet   Other Visit Diagnoses       Seasonal allergic rhinitis due to pollen       Relevant Medications   fluticasone (FLONASE) 50 MCG/ACT nasal spray       Return in about 10 days (around 11/18/2023) for COPD f/u.   Total time spent: 30 minutes  Luna Fuse, MD  11/08/2023   This document may have been prepared by [facility] Voice Recognition software and as such may include unintentional dictation errors.

## 2023-11-14 ENCOUNTER — Ambulatory Visit: Payer: Medicare Other | Admitting: Cardiovascular Disease

## 2023-11-19 ENCOUNTER — Encounter: Payer: Self-pay | Admitting: Internal Medicine

## 2023-11-19 ENCOUNTER — Ambulatory Visit (INDEPENDENT_AMBULATORY_CARE_PROVIDER_SITE_OTHER): Payer: Medicare Other | Admitting: Internal Medicine

## 2023-11-19 VITALS — BP 98/57 | HR 73 | Ht 64.0 in | Wt 95.0 lb

## 2023-11-19 DIAGNOSIS — E782 Mixed hyperlipidemia: Secondary | ICD-10-CM

## 2023-11-19 DIAGNOSIS — J441 Chronic obstructive pulmonary disease with (acute) exacerbation: Secondary | ICD-10-CM

## 2023-11-19 DIAGNOSIS — I10 Essential (primary) hypertension: Secondary | ICD-10-CM

## 2023-11-19 NOTE — Progress Notes (Signed)
 Established Patient Office Visit  Subjective:  Patient ID: Wendy Ferrell, female    DOB: 1955/09/27  Age: 68 y.o. MRN: [id number]  No chief complaint on file.   Feels better, cough has improved and SOB is chronic but stable oxygen requirements.    No other concerns at this time.   Past Medical History:  Diagnosis Date   COPD (chronic obstructive pulmonary disease) (HCC)    Hypertension    Nicotine addiction    Oxygen dependent    Peripheral vascular disease (HCC)     Past Surgical History:  Procedure Laterality Date   ABDOMINAL HYSTERECTOMY     COLONOSCOPY WITH PROPOFOL N/A 02/21/2016   Procedure: COLONOSCOPY WITH PROPOFOL;  Surgeon: Christena Deem, MD;  Location: [facility] ENDOSCOPY;  Service: Endoscopy;  Laterality: N/A;   LOWER EXTREMITY ANGIOGRAPHY Right 05/28/2017   Procedure: Lower Extremity Angiography;  Surgeon: Renford Dills, MD;  Location: [facility] INVASIVE CV LAB;  Service: Cardiovascular;  Laterality: Right;   MASTOIDECTOMY     NECK SURGERY     PERIPHERAL VASCULAR CATHETERIZATION Left 05/29/2016   Procedure: Lower Extremity Angiography;  Surgeon: Renford Dills, MD;  Location: [facility] INVASIVE CV LAB;  Service: Cardiovascular;  Laterality: Left;   PERIPHERAL VASCULAR CATHETERIZATION  05/29/2016   Procedure: Lower Extremity Intervention;  Surgeon: Renford Dills, MD;  Location: [facility] INVASIVE CV LAB;  Service: Cardiovascular;;   TONSILLECTOMY      Social History   Socioeconomic History   Marital status: Legally Separated    Spouse name: Not on file   Number of children: Not on file   Years of education: Not on file   Highest education level: Not on file  Occupational History   Not on file  Tobacco Use   Smoking status: Former    Current packs/day: 0.50    Average packs/day: 0.5 packs/day for 45.0 years (22.5 ttl pk-yrs)    Types: Cigarettes   Smokeless tobacco: Never  Vaping Use   Vaping status: Never Used  Substance and Sexual Activity   Alcohol  use: No   Drug use: No   Sexual activity: Not on file  Other Topics Concern   Not on file  Social History Narrative   Not on file   Social Drivers of Health   Financial Resource Strain: Not on file  Food Insecurity: Not on file  Transportation Needs: Not on file  Physical Activity: Not on file  Stress: Not on file  Social Connections: Not on file  Intimate Partner Violence: Not on file    Family History  Problem Relation Age of Onset   COPD Mother    Hypertension Mother    COPD Father    Breast cancer Maternal Grandmother 55   Breast cancer Cousin 50       maternal side    No Known Allergies  Outpatient Medications Prior to Visit  Medication Sig   amLODipine (NORVASC) 10 MG tablet Take 1 tablet (10 mg total) by mouth daily.   atorvastatin (LIPITOR) 40 MG tablet Take 1 tablet (40 mg total) by mouth daily.   cetirizine (ZYRTEC) 10 MG tablet TAKE 1 TABLET BY MOUTH EVERY DAY   chlorthalidone (HYGROTON) 25 MG tablet TAKE 1/2 TABLET BY MOUTH DAILY AS DIRECTED   clopidogrel (PLAVIX) 75 MG tablet TAKE 1 TABLET BY MOUTH EVERY DAY   ELIQUIS 5 MG TABS tablet TAKE 1 TABLET BY MOUTH TWICE A DAY   fluticasone (FLONASE) 50 MCG/ACT nasal spray Place 1 spray into  both nostrils daily.   Fluticasone-Umeclidin-Vilant (TRELEGY ELLIPTA) 100-62.5-25 MCG/ACT AEPB USE AS DIRECTED 1 INHALATION PER DAY   Ipratropium-Albuterol (COMBIVENT RESPIMAT) 20-100 MCG/ACT AERS respimat Inhale 1 puff into the lungs every 6 (six) hours as needed for wheezing.   losartan (COZAAR) 25 MG tablet TAKE 1 TABLET BY MOUTH EVERY DAY   OXYGEN Inhale into the lungs.   No facility-administered medications prior to visit.    Review of Systems  Constitutional: Negative.  Negative for chills, fever and weight loss.  HENT:  Negative for sore throat.   Eyes: Negative.   Cardiovascular:  Positive for palpitations.  Gastrointestinal: Negative.   Genitourinary: Negative.   Musculoskeletal: Negative.   Skin: Negative.    Neurological:  Positive for dizziness. Negative for headaches.  Endo/Heme/Allergies: Negative.   Psychiatric/Behavioral:  The patient does not have insomnia.        Objective:   BP (!) 98/57   Pulse 73   Ht 5\' 4"  (1.626 m)   Wt 95 lb (43.1 kg)   SpO2 95%   BMI 16.31 kg/m   Vitals:   11/19/23 1407  BP: (!) 98/57  Pulse: 73  Height: 5\' 4"  (1.626 m)  Weight: 95 lb (43.1 kg)  SpO2: 95%  BMI (Calculated): 16.3    Physical Exam Vitals reviewed.  Constitutional:      General: She is not in acute distress. HENT:     Head: Normocephalic.     Comments: No sinus tenderness    Nose: Nose normal.     Mouth/Throat:     Mouth: Mucous membranes are moist.  Eyes:     Extraocular Movements: Extraocular movements intact.     Pupils: Pupils are equal, round, and reactive to light.  Cardiovascular:     Rate and Rhythm: Normal rate. Rhythm irregular.     Heart sounds: No murmur heard. Pulmonary:     Effort: Pulmonary effort is normal.     Breath sounds: Decreased air movement present. No rhonchi or rales.  Abdominal:     General: Abdomen is flat.     Palpations: There is no hepatomegaly, splenomegaly or mass.  Musculoskeletal:        General: Normal range of motion.     Cervical back: Normal range of motion. No tenderness.  Skin:    General: Skin is warm and dry.  Neurological:     General: No focal deficit present.     Mental Status: She is alert and oriented to person, place, and time.     Cranial Nerves: No cranial nerve deficit.     Motor: No weakness.  Psychiatric:        Mood and Affect: Mood normal.        Behavior: Behavior normal.      No results found for any visits on 11/19/23.  No results found for this or any previous visit (from the past 2160 hours).    Assessment & Plan:  As per problem list. Problem List Items Addressed This Visit       Cardiovascular and Mediastinum   Essential hypertension     Respiratory   COPD with acute exacerbation  (HCC) - Primary     Other   Hyperlipidemia   Relevant Orders   CK   Hepatic function panel    Return in about 3 months (around 02/19/2024) for fu with labs prior.   Total time spent: 20 minutes  Luna Fuse, MD  11/19/2023   This document may have been prepared  by [employer] and as such may include unintentional dictation errors.

## 2023-11-21 DIAGNOSIS — J449 Chronic obstructive pulmonary disease, unspecified: Secondary | ICD-10-CM | POA: Diagnosis not present

## 2023-11-25 ENCOUNTER — Ambulatory Visit: Admitting: Cardiovascular Disease

## 2023-12-22 DIAGNOSIS — J449 Chronic obstructive pulmonary disease, unspecified: Secondary | ICD-10-CM | POA: Diagnosis not present

## 2023-12-24 ENCOUNTER — Encounter: Payer: Self-pay | Admitting: Cardiovascular Disease

## 2023-12-24 ENCOUNTER — Ambulatory Visit (INDEPENDENT_AMBULATORY_CARE_PROVIDER_SITE_OTHER): Admitting: Cardiovascular Disease

## 2023-12-24 VITALS — BP 110/68 | HR 79 | Ht 64.0 in | Wt 96.0 lb

## 2023-12-24 DIAGNOSIS — I6523 Occlusion and stenosis of bilateral carotid arteries: Secondary | ICD-10-CM | POA: Diagnosis not present

## 2023-12-24 DIAGNOSIS — R55 Syncope and collapse: Secondary | ICD-10-CM

## 2023-12-24 DIAGNOSIS — R0602 Shortness of breath: Secondary | ICD-10-CM | POA: Diagnosis not present

## 2023-12-24 DIAGNOSIS — I10 Essential (primary) hypertension: Secondary | ICD-10-CM | POA: Diagnosis not present

## 2023-12-24 DIAGNOSIS — I739 Peripheral vascular disease, unspecified: Secondary | ICD-10-CM | POA: Diagnosis not present

## 2023-12-24 DIAGNOSIS — E782 Mixed hyperlipidemia: Secondary | ICD-10-CM

## 2023-12-24 DIAGNOSIS — I70213 Atherosclerosis of native arteries of extremities with intermittent claudication, bilateral legs: Secondary | ICD-10-CM | POA: Diagnosis not present

## 2023-12-24 NOTE — Progress Notes (Signed)
 Cardiology Office Note   Date:  12/24/2023   ID:  Wendy Ferrell, DOB [birth year], MRN [id number]  PCP:  Sherron Monday, MD  Cardiologist:  Adrian Blackwater, MD      History of Present Illness: Wendy Ferrell is a 68 y.o. female who presents for  Chief Complaint  Patient presents with   Follow-up    3 month follow up    Doing better, still SOB on home O2      Past Medical History:  Diagnosis Date   COPD (chronic obstructive pulmonary disease) (HCC)    Hypertension    Nicotine addiction    Oxygen dependent    Peripheral vascular disease (HCC)      Past Surgical History:  Procedure Laterality Date   ABDOMINAL HYSTERECTOMY     COLONOSCOPY WITH PROPOFOL N/A 02/21/2016   Procedure: COLONOSCOPY WITH PROPOFOL;  Surgeon: Christena Deem, MD;  Location: [facility] ENDOSCOPY;  Service: Endoscopy;  Laterality: N/A;   LOWER EXTREMITY ANGIOGRAPHY Right 05/28/2017   Procedure: Lower Extremity Angiography;  Surgeon: Renford Dills, MD;  Location: [facility] INVASIVE CV LAB;  Service: Cardiovascular;  Laterality: Right;   MASTOIDECTOMY     NECK SURGERY     PERIPHERAL VASCULAR CATHETERIZATION Left 05/29/2016   Procedure: Lower Extremity Angiography;  Surgeon: Renford Dills, MD;  Location: [facility] INVASIVE CV LAB;  Service: Cardiovascular;  Laterality: Left;   PERIPHERAL VASCULAR CATHETERIZATION  05/29/2016   Procedure: Lower Extremity Intervention;  Surgeon: Renford Dills, MD;  Location: [facility] INVASIVE CV LAB;  Service: Cardiovascular;;   TONSILLECTOMY       Current Outpatient Medications  Medication Sig Dispense Refill   amLODipine (NORVASC) 10 MG tablet Take 1 tablet (10 mg total) by mouth daily. 30 tablet 0   atorvastatin (LIPITOR) 40 MG tablet Take 1 tablet (40 mg total) by mouth daily. 90 tablet 0   cetirizine (ZYRTEC) 10 MG tablet TAKE 1 TABLET BY MOUTH EVERY DAY 90 tablet 1   chlorthalidone (HYGROTON) 25 MG tablet TAKE 1/2 TABLET BY MOUTH DAILY AS DIRECTED 45 tablet 1    clopidogrel (PLAVIX) 75 MG tablet TAKE 1 TABLET BY MOUTH EVERY DAY 90 tablet 4   ELIQUIS 5 MG TABS tablet TAKE 1 TABLET BY MOUTH TWICE A DAY 60 tablet 2   fluticasone (FLONASE) 50 MCG/ACT nasal spray Place 1 spray into both nostrils daily. 16 mL 2   Fluticasone-Umeclidin-Vilant (TRELEGY ELLIPTA) 100-62.5-25 MCG/ACT AEPB USE AS DIRECTED 1 INHALATION PER DAY 180 each 1   Ipratropium-Albuterol (COMBIVENT RESPIMAT) 20-100 MCG/ACT AERS respimat Inhale 1 puff into the lungs every 6 (six) hours as needed for wheezing. 4 g 5   losartan (COZAAR) 25 MG tablet TAKE 1 TABLET BY MOUTH EVERY DAY 90 tablet 1   OXYGEN Inhale into the lungs.     No current facility-administered medications for this visit.    Allergies:   Patient has no known allergies.    Social History:   reports that she has quit smoking. Her smoking use included cigarettes. She has a 22.5 pack-year smoking history. She has never used smokeless tobacco. She reports that she does not drink alcohol and does not use drugs.   Family History:  family history includes Breast cancer (age of onset: 4) in her cousin; Breast cancer (age of onset: 6) in her maternal grandmother; COPD in her father and mother; Hypertension in her mother.    ROS:     Review of Systems  Constitutional: Negative.  HENT: Negative.    Eyes: Negative.   Respiratory: Negative.    Gastrointestinal: Negative.   Genitourinary: Negative.   Musculoskeletal: Negative.   Skin: Negative.   Neurological: Negative.   Endo/Heme/Allergies: Negative.   Psychiatric/Behavioral: Negative.    All other systems reviewed and are negative.     All other systems are reviewed and negative.    PHYSICAL EXAM: VS:  BP 110/68   Pulse 79   Ht 5\' 4"  (1.626 m)   Wt 96 lb (43.5 kg)   SpO2 92%   BMI 16.48 kg/m  , BMI Body mass index is 16.48 kg/m. Last weight:  Wt Readings from Last 3 Encounters:  12/24/23 96 lb (43.5 kg)  11/19/23 95 lb (43.1 kg)  11/08/23 95 lb (43.1 kg)      Physical Exam Constitutional:      Appearance: Normal appearance.  Cardiovascular:     Rate and Rhythm: Normal rate and regular rhythm.     Heart sounds: Normal heart sounds.  Pulmonary:     Effort: Pulmonary effort is normal.     Breath sounds: Normal breath sounds.  Musculoskeletal:     Right lower leg: No edema.     Left lower leg: No edema.  Neurological:     Mental Status: She is alert.       EKG:   Recent Labs: 03/01/2023: ALT 23; BUN 7; Creatinine, Ser 0.71; Potassium 4.6; Sodium 132    Lipid Panel    Component Value Date/Time   CHOL 179 03/01/2023 1123   TRIG 47 03/01/2023 1123   HDL 123 03/01/2023 1123   CHOLHDL 1.5 03/01/2023 1123   LDLCALC 46 03/01/2023 1123      Other studies Reviewed: Additional studies/ records that were reviewed today include:  Review of the above records demonstrates:       No data to display            ASSESSMENT AND PLAN:    ICD-10-CM   1. Mixed hyperlipidemia  E78.2     2. Essential hypertension  I10     3. PAD (peripheral artery disease) (HCC)  I73.9     4. Bilateral carotid artery stenosis  I65.23     5. Atherosclerosis of native artery of both lower extremities with intermittent claudication (HCC)  I70.213     6. SOB (shortness of breath)  R06.02        Problem List Items Addressed This Visit       Cardiovascular and Mediastinum   PAD (peripheral artery disease) (HCC)   Bilateral carotid artery stenosis   Atherosclerosis of native arteries of extremity with intermittent claudication (HCC)   Essential hypertension     Other   Hyperlipidemia - Primary   Other Visit Diagnoses       SOB (shortness of breath)              Disposition:   Return in about 2 months (around 02/23/2024).    Total time spent: 30 minutes  Signed,  Debborah Fairly, MD  12/24/2023 1:36 PM    [facility]

## 2024-01-13 ENCOUNTER — Other Ambulatory Visit: Payer: Self-pay

## 2024-01-13 DIAGNOSIS — I739 Peripheral vascular disease, unspecified: Secondary | ICD-10-CM

## 2024-01-13 MED ORDER — CLOPIDOGREL BISULFATE 75 MG PO TABS: 75.0000 mg | ORAL_TABLET | Freq: Every day | ORAL | 4 refills | Status: DC

## 2024-01-21 ENCOUNTER — Emergency Department

## 2024-01-21 ENCOUNTER — Encounter: Payer: Self-pay | Admitting: *Deleted

## 2024-01-21 ENCOUNTER — Inpatient Hospital Stay
Admission: EM | Admit: 2024-01-21 | Discharge: 2024-01-27 | DRG: 535 | Disposition: A | Discharge status: Home Health | Attending: Internal Medicine | Admitting: Internal Medicine

## 2024-01-21 ENCOUNTER — Other Ambulatory Visit: Payer: Self-pay

## 2024-01-21 DIAGNOSIS — Z66 Do not resuscitate: Secondary | ICD-10-CM | POA: Diagnosis not present

## 2024-01-21 DIAGNOSIS — M47816 Spondylosis without myelopathy or radiculopathy, lumbar region: Secondary | ICD-10-CM | POA: Diagnosis not present

## 2024-01-21 DIAGNOSIS — S79912A Unspecified injury of left hip, initial encounter: Secondary | ICD-10-CM | POA: Diagnosis not present

## 2024-01-21 DIAGNOSIS — E785 Hyperlipidemia, unspecified: Secondary | ICD-10-CM | POA: Diagnosis present

## 2024-01-21 DIAGNOSIS — Z7901 Long term (current) use of anticoagulants: Secondary | ICD-10-CM | POA: Diagnosis not present

## 2024-01-21 DIAGNOSIS — Z9981 Dependence on supplemental oxygen: Secondary | ICD-10-CM

## 2024-01-21 DIAGNOSIS — I959 Hypotension, unspecified: Secondary | ICD-10-CM | POA: Diagnosis not present

## 2024-01-21 DIAGNOSIS — Z825 Family history of asthma and other chronic lower respiratory diseases: Secondary | ICD-10-CM | POA: Diagnosis not present

## 2024-01-21 DIAGNOSIS — J449 Chronic obstructive pulmonary disease, unspecified: Secondary | ICD-10-CM | POA: Diagnosis present

## 2024-01-21 DIAGNOSIS — J9621 Acute and chronic respiratory failure with hypoxia: Secondary | ICD-10-CM | POA: Diagnosis not present

## 2024-01-21 DIAGNOSIS — Z716 Tobacco abuse counseling: Secondary | ICD-10-CM

## 2024-01-21 DIAGNOSIS — M25552 Pain in left hip: Secondary | ICD-10-CM | POA: Diagnosis not present

## 2024-01-21 DIAGNOSIS — Z7984 Long term (current) use of oral hypoglycemic drugs: Secondary | ICD-10-CM | POA: Diagnosis not present

## 2024-01-21 DIAGNOSIS — F419 Anxiety disorder, unspecified: Secondary | ICD-10-CM | POA: Diagnosis present

## 2024-01-21 DIAGNOSIS — R918 Other nonspecific abnormal finding of lung field: Secondary | ICD-10-CM | POA: Diagnosis not present

## 2024-01-21 DIAGNOSIS — S32502A Unspecified fracture of left pubis, initial encounter for closed fracture: Secondary | ICD-10-CM | POA: Diagnosis not present

## 2024-01-21 DIAGNOSIS — Z79899 Other long term (current) drug therapy: Secondary | ICD-10-CM

## 2024-01-21 DIAGNOSIS — M160 Bilateral primary osteoarthritis of hip: Secondary | ICD-10-CM | POA: Diagnosis not present

## 2024-01-21 DIAGNOSIS — S32592A Other specified fracture of left pubis, initial encounter for closed fracture: Secondary | ICD-10-CM | POA: Diagnosis not present

## 2024-01-21 DIAGNOSIS — I480 Paroxysmal atrial fibrillation: Secondary | ICD-10-CM | POA: Diagnosis present

## 2024-01-21 DIAGNOSIS — W010XXA Fall on same level from slipping, tripping and stumbling without subsequent striking against object, initial encounter: Secondary | ICD-10-CM | POA: Diagnosis present

## 2024-01-21 DIAGNOSIS — R54 Age-related physical debility: Secondary | ICD-10-CM | POA: Diagnosis not present

## 2024-01-21 DIAGNOSIS — Z136 Encounter for screening for cardiovascular disorders: Secondary | ICD-10-CM | POA: Diagnosis not present

## 2024-01-21 DIAGNOSIS — Z8249 Family history of ischemic heart disease and other diseases of the circulatory system: Secondary | ICD-10-CM

## 2024-01-21 DIAGNOSIS — J439 Emphysema, unspecified: Secondary | ICD-10-CM | POA: Diagnosis not present

## 2024-01-21 DIAGNOSIS — M1612 Unilateral primary osteoarthritis, left hip: Secondary | ICD-10-CM | POA: Diagnosis not present

## 2024-01-21 DIAGNOSIS — E119 Type 2 diabetes mellitus without complications: Secondary | ICD-10-CM

## 2024-01-21 DIAGNOSIS — R52 Pain, unspecified: Secondary | ICD-10-CM | POA: Diagnosis present

## 2024-01-21 DIAGNOSIS — Z7902 Long term (current) use of antithrombotics/antiplatelets: Secondary | ICD-10-CM

## 2024-01-21 DIAGNOSIS — Z681 Body mass index (BMI) 19 or less, adult: Secondary | ICD-10-CM

## 2024-01-21 DIAGNOSIS — F17200 Nicotine dependence, unspecified, uncomplicated: Secondary | ICD-10-CM | POA: Diagnosis present

## 2024-01-21 DIAGNOSIS — Z803 Family history of malignant neoplasm of breast: Secondary | ICD-10-CM | POA: Diagnosis not present

## 2024-01-21 DIAGNOSIS — E1151 Type 2 diabetes mellitus with diabetic peripheral angiopathy without gangrene: Secondary | ICD-10-CM | POA: Diagnosis not present

## 2024-01-21 DIAGNOSIS — J441 Chronic obstructive pulmonary disease with (acute) exacerbation: Secondary | ICD-10-CM | POA: Diagnosis not present

## 2024-01-21 DIAGNOSIS — Z7409 Other reduced mobility: Secondary | ICD-10-CM | POA: Diagnosis present

## 2024-01-21 DIAGNOSIS — M25559 Pain in unspecified hip: Secondary | ICD-10-CM | POA: Diagnosis not present

## 2024-01-21 DIAGNOSIS — S32512A Fracture of superior rim of left pubis, initial encounter for closed fracture: Secondary | ICD-10-CM | POA: Diagnosis not present

## 2024-01-21 DIAGNOSIS — R0602 Shortness of breath: Secondary | ICD-10-CM | POA: Diagnosis not present

## 2024-01-21 DIAGNOSIS — I739 Peripheral vascular disease, unspecified: Secondary | ICD-10-CM | POA: Diagnosis present

## 2024-01-21 DIAGNOSIS — W19XXXA Unspecified fall, initial encounter: Secondary | ICD-10-CM | POA: Diagnosis not present

## 2024-01-21 DIAGNOSIS — I10 Essential (primary) hypertension: Secondary | ICD-10-CM | POA: Diagnosis not present

## 2024-01-21 DIAGNOSIS — I700 Atherosclerosis of aorta: Secondary | ICD-10-CM | POA: Diagnosis not present

## 2024-01-21 DIAGNOSIS — Z90710 Acquired absence of both cervix and uterus: Secondary | ICD-10-CM

## 2024-01-21 LAB — CBC WITH DIFFERENTIAL/PLATELET
Abs Immature Granulocytes: 0.07 10*3/uL (ref 0.00–0.07)
Basophils Absolute: 0.1 10*3/uL (ref 0.0–0.1)
Basophils Relative: 0 %
Eosinophils Absolute: 0 10*3/uL (ref 0.0–0.5)
Eosinophils Relative: 0 %
HCT: 37 % (ref 36.0–46.0)
Hemoglobin: 12.7 g/dL (ref 12.0–15.0)
Immature Granulocytes: 1 %
Lymphocytes Relative: 5 %
Lymphs Abs: 0.7 10*3/uL (ref 0.7–4.0)
MCH: 32.1 pg (ref 26.0–34.0)
MCHC: 34.3 g/dL (ref 30.0–36.0)
MCV: 93.4 fL (ref 80.0–100.0)
Monocytes Absolute: 1.1 10*3/uL — ABNORMAL HIGH (ref 0.1–1.0)
Monocytes Relative: 8 %
Neutro Abs: 11.8 10*3/uL — ABNORMAL HIGH (ref 1.7–7.7)
Neutrophils Relative %: 86 %
Platelets: 331 10*3/uL (ref 150–400)
RBC: 3.96 MIL/uL (ref 3.87–5.11)
RDW: 12.8 % (ref 11.5–15.5)
WBC: 13.7 10*3/uL — ABNORMAL HIGH (ref 4.0–10.5)
nRBC: 0 % (ref 0.0–0.2)

## 2024-01-21 LAB — BASIC METABOLIC PANEL WITH GFR
Anion gap: 12 (ref 5–15)
BUN: 12 mg/dL (ref 8–23)
CO2: 38 mmol/L — ABNORMAL HIGH (ref 22–32)
Calcium: 9 mg/dL (ref 8.9–10.3)
Chloride: 80 mmol/L — ABNORMAL LOW (ref 98–111)
Creatinine, Ser: 0.64 mg/dL (ref 0.44–1.00)
GFR, Estimated: >60 mL/min (ref >60)
Glucose, Bld: 117 mg/dL — ABNORMAL HIGH (ref 70–99)
Potassium: 3.7 mmol/L (ref 3.5–5.1)
Sodium: 130 mmol/L — ABNORMAL LOW (ref 135–145)

## 2024-01-21 MED ORDER — OXYCODONE-ACETAMINOPHEN 5-325 MG PO TABS
1.0000 | ORAL_TABLET | Freq: Once | ORAL | Status: AC
  Administered 2024-01-21: 1 via ORAL
  Filled 2024-01-21: qty 1

## 2024-01-21 MED ORDER — IPRATROPIUM-ALBUTEROL 0.5-2.5 (3) MG/3ML IN SOLN
3.0000 mL | Freq: Four times a day (QID) | RESPIRATORY_TRACT | Status: DC | PRN
  Administered 2024-01-24: 3 mL via RESPIRATORY_TRACT
  Filled 2024-01-21: qty 3

## 2024-01-21 MED ORDER — MORPHINE SULFATE (PF) 2 MG/ML IV SOLN: 2.0000 mg | INTRAVENOUS | Status: DC | PRN

## 2024-01-21 MED ORDER — OXYCODONE HCL 5 MG PO TABS
5.0000 mg | ORAL_TABLET | Freq: Four times a day (QID) | ORAL | Status: DC | PRN
  Administered 2024-01-21 – 2024-01-22 (×2): 5 mg via ORAL
  Filled 2024-01-21 (×2): qty 1

## 2024-01-21 MED ORDER — SODIUM CHLORIDE 0.9 % IV SOLN: INTRAVENOUS | Status: DC

## 2024-01-21 MED ORDER — CLOPIDOGREL BISULFATE 75 MG PO TABS
75.0000 mg | ORAL_TABLET | Freq: Every day | ORAL | Status: DC
  Administered 2024-01-22 – 2024-01-27 (×6): 75 mg via ORAL
  Filled 2024-01-21 (×6): qty 1

## 2024-01-21 MED ORDER — NICOTINE 21 MG/24HR TD PT24: 21.0000 mg | MEDICATED_PATCH | Freq: Every day | TRANSDERMAL | Status: AC | PRN

## 2024-01-21 MED ORDER — HYDRALAZINE HCL 20 MG/ML IJ SOLN: 5.0000 mg | Freq: Four times a day (QID) | INTRAMUSCULAR | Status: AC | PRN

## 2024-01-21 MED ORDER — BUDESON-GLYCOPYRROL-FORMOTEROL 160-9-4.8 MCG/ACT IN AERO
2.0000 | INHALATION_SPRAY | Freq: Two times a day (BID) | RESPIRATORY_TRACT | Status: DC
  Administered 2024-01-22 – 2024-01-24 (×5): 2 via RESPIRATORY_TRACT
  Filled 2024-01-21: qty 5.9

## 2024-01-21 MED ORDER — ACETAMINOPHEN 325 MG PO TABS: 650.0000 mg | ORAL_TABLET | Freq: Four times a day (QID) | ORAL | Status: AC | PRN

## 2024-01-21 MED ORDER — MELATONIN 5 MG PO TABS: 5.0000 mg | ORAL_TABLET | Freq: Every evening | ORAL | Status: DC | PRN

## 2024-01-21 MED ORDER — MORPHINE SULFATE (PF) 4 MG/ML IV SOLN: 4.0000 mg | INTRAVENOUS | Status: DC | PRN

## 2024-01-21 MED ORDER — ACETAMINOPHEN 650 MG RE SUPP: 650.0000 mg | Freq: Four times a day (QID) | RECTAL | Status: AC | PRN

## 2024-01-21 MED ORDER — APIXABAN 5 MG PO TABS
5.0000 mg | ORAL_TABLET | Freq: Two times a day (BID) | ORAL | Status: DC
  Administered 2024-01-22 – 2024-01-27 (×11): 5 mg via ORAL
  Filled 2024-01-21 (×11): qty 1

## 2024-01-21 MED ORDER — LOSARTAN POTASSIUM 25 MG PO TABS
25.0000 mg | ORAL_TABLET | Freq: Every day | ORAL | Status: DC
  Administered 2024-01-22 – 2024-01-27 (×6): 25 mg via ORAL
  Filled 2024-01-21 (×6): qty 1

## 2024-01-21 MED ORDER — FLUTICASONE PROPIONATE 50 MCG/ACT NA SUSP: 1.0000 | Freq: Every day | NASAL | Status: DC | PRN

## 2024-01-21 MED ORDER — DOCUSATE SODIUM 100 MG PO CAPS
100.0000 mg | ORAL_CAPSULE | Freq: Two times a day (BID) | ORAL | Status: DC
  Administered 2024-01-22 – 2024-01-26 (×9): 100 mg via ORAL
  Filled 2024-01-21 (×10): qty 1

## 2024-01-21 MED ORDER — ONDANSETRON HCL 4 MG/2ML IJ SOLN: 4.0000 mg | Freq: Four times a day (QID) | INTRAMUSCULAR | Status: AC | PRN

## 2024-01-21 MED ORDER — ATORVASTATIN CALCIUM 20 MG PO TABS
40.0000 mg | ORAL_TABLET | Freq: Every day | ORAL | Status: DC
  Administered 2024-01-22 – 2024-01-26 (×5): 40 mg via ORAL
  Filled 2024-01-21 (×5): qty 2

## 2024-01-21 MED ORDER — ONDANSETRON HCL 4 MG PO TABS: 4.0000 mg | ORAL_TABLET | Freq: Four times a day (QID) | ORAL | Status: AC | PRN

## 2024-01-21 NOTE — Assessment & Plan Note (Signed)
 Hydralazine 5 mg IV every 6 hours as needed for SBP greater 165, 5 days ordered

## 2024-01-21 NOTE — Hospital Course (Addendum)
 Ms. Wendy Ferrell is a 68 year old female with history of COPD, non insulin-dependent diabetes melitis, hypertension, PVD on Eliquis , COPD, chronic hypoxic respiratory failure requiring baseline 4 L nasal cannula at baseline, who presents emergency department for chief concerns of a fall yesterday.  Vitals in the ED showed temperature of 98.8, respiration 20, heart rate 70, blood pressure 138/52, SpO2 96% on 4 L nasal cannula.  Serum sodium is 130, potassium 2.7, chloride 80, bicarb 38, BUN of 12, serum creatinine 0.64, EGFR greater than 60, nonfasting blood glucose 117, WBC 13.7, platelets 331, hemoglobin 12.7.  ED treatment: Oxycodone  5-325 mg p.o. one-time dose.

## 2024-01-21 NOTE — ED Provider Notes (Signed)
 [facility] Provider Note    Event Date/Time   First MD Initiated Contact with Patient 01/21/24 [phone]     (approximate)   History   Chief Complaint Fall and Hip Pain   HPI  Wendy Ferrell is a 68 y.o. female with past medical history of hypertension, diabetes, COPD, chronic hypoxic respiratory failure on 4 L, and peripheral vascular disease on Eliquis  who presents to the ED complaining of fall and hip pain.  Patient reports that yesterday she was out on her deck when she lost her balance and fell onto her left hip.  She denies hitting her head or losing consciousness, complains only of pain in her left hip.  Patient reports that she was able to bear weight on her left leg yesterday, but pain has worsened today and she has been unable to do so.  She states that the pain comes around into the left side of her groin, but she denies any pain in her left knee or ankle.     Physical Exam   Triage Vital Signs: ED Triage Vitals  Encounter Vitals Group     BP      Systolic BP Percentile      Diastolic BP Percentile      Pulse      Resp      Temp      Temp src      SpO2      Weight      Height      Head Circumference      Peak Flow      Pain Score      Pain Loc      Pain Education      Exclude from Growth Chart     Most recent vital signs: Vitals:   01/21/24 1623  BP: (!) 138/52  Pulse: 70  Resp: 20  Temp: 98.8 F (37.1 C)  SpO2: 96%    Constitutional: Alert and oriented. Eyes: Conjunctivae are normal. Head: Atraumatic. Nose: No congestion/rhinnorhea. Mouth/Throat: Mucous membranes are moist.  Neck: No midline cervical spine tenderness to palpation. Cardiovascular: Normal rate, regular rhythm. Grossly normal heart sounds.  2+ radial and DP pulses bilaterally. Respiratory: Normal respiratory effort.  No retractions. Lungs CTAB.  No chest wall tenderness to palpation. Gastrointestinal: Soft and nontender. No distention. Musculoskeletal:  Diffuse tenderness to palpation of left hip with no obvious deformity.  No tenderness to palpation of right hip, bilateral knees, or bilateral ankles. Neurologic:  Normal speech and language. No gross focal neurologic deficits are appreciated.    ED Results / Procedures / Treatments   Labs (all labs ordered are listed, but only abnormal results are displayed) Labs Reviewed  CBC WITH DIFFERENTIAL/PLATELET - Abnormal; Notable for the following components:      Result Value   WBC 13.7 (*)    Neutro Abs 11.8 (*)    Monocytes Absolute 1.1 (*)    All other components within normal limits  BASIC METABOLIC PANEL WITH GFR - Abnormal; Notable for the following components:   Sodium 130 (*)    Chloride 80 (*)    CO2 38 (*)    Glucose, Bld 117 (*)    All other components within normal limits     RADIOLOGY Left hip x-ray reviewed and interpreted by me with no fracture or dislocation.  PROCEDURES:  Critical Care performed: No  Procedures   MEDICATIONS ORDERED IN ED: Medications  oxyCODONE -acetaminophen  (PERCOCET/ROXICET) 5-325 MG per tablet 1 tablet (  1 tablet Oral Given 01/21/24 1627)     IMPRESSION / MDM / ASSESSMENT AND PLAN / ED COURSE  I reviewed the triage vital signs and the nursing notes.                              68 y.o. female with past medical history of hypertension, diabetes, PAD on Eliquis , COPD, and chronic hypoxic respiratory failure on 4 L who presents to the ED complaining of left hip pain following a fall yesterday.  Patient's presentation is most consistent with acute presentation with potential threat to life or bodily function.  Differential diagnosis includes, but is not limited to, fracture, dislocation, contusion.  Patient nontoxic-appearing and in no acute distress, vital signs are unremarkable.  She has tenderness to palpation and pain upon range of motion of her left hip but no obvious deformity noted.  She is neurovascularly intact distally and we  will further assess with x-ray, treat symptomatically with Percocet.  No evidence of traumatic injury to her head, neck, trunk, or upper extremities.  Left hip x-ray is unremarkable, CT imaging of left hip was performed and shows fracture of her pubic bone.  Patient unable to bear weight on her left leg and ambulate at this time, injury appears nonoperative but she would benefit from admission for pain control and PT/OT.  Labs without significant anemia, leukocytosis, electrolyte abnormality, or AKI.  Case discussed with hospitalist for admission.      FINAL CLINICAL IMPRESSION(S) / ED DIAGNOSES   Final diagnoses:  Closed fracture of left pubis, unspecified portion of pubis, initial encounter (HCC)     Rx / DC Orders   ED Discharge Orders     None        Note:  This document was prepared using Dragon voice recognition software and may include unintentional dictation errors.   Twilla Galea, MD 01/21/24 [phone]

## 2024-01-21 NOTE — ED Notes (Signed)
 Pt to floor via stretcher with NT

## 2024-01-21 NOTE — Assessment & Plan Note (Signed)
Home atorvastatin 40 mg nightly resumed

## 2024-01-21 NOTE — Assessment & Plan Note (Addendum)
 Continue baseline 4 L nasal cannula Does not appear to be in acute exacerbation Home long-acting inhaler equivalent resumed on admission DuoNebs every 6 hours as needed for shortness of breath, wheezing, 3 days ordered

## 2024-01-21 NOTE — Assessment & Plan Note (Signed)
 As did nicotine  patch

## 2024-01-21 NOTE — ED Triage Notes (Signed)
 Pt brought in via ems from home.  Pt fell yesterday on the deck watering flowers.  No loc. + blood thinners   pt has left hip pain.  No neck or back pain  pt on 4 liters o2 Wendy Ferrell    hx copd  pt alert  speech clear.

## 2024-01-21 NOTE — H&P (Addendum)
 History and Physical   Wendy Ferrell:[id number] DOB: 09-18-1955 DOA: 01/21/2024  PCP: Shari Daughters, MD  Patient coming from: Home  I have personally briefly reviewed patient's old medical records in [facility] EMR.  Chief Concern: Left hip pain  HPI: Ms. Wendy Ferrell is a 68 year old female with history of COPD, non insulin-dependent diabetes melitis, hypertension, PVD on Eliquis , COPD, chronic hypoxic respiratory failure requiring baseline 4 L nasal cannula at baseline, who presents emergency department for chief concerns of a fall yesterday.  Vitals in the ED showed temperature of 98.8, respiration 20, heart rate 70, blood pressure 138/52, SpO2 96% on 4 L nasal cannula.  Serum sodium is 130, potassium 2.7, chloride 80, bicarb 38, BUN of 12, serum creatinine 0.64, EGFR greater than 60, nonfasting blood glucose 117, WBC 13.7, platelets 331, hemoglobin 12.7.  ED treatment: Oxycodone  5-325 mg p.o. one-time dose. -------------------------- At bedside, she is able to tell me her first and last name, age, current location, current calendar year.   Yesterday, she stepped out her door on the deck to water plants and next thing she knows, she felt herself flying across the deck. She denies head trauma, lost of consciousness. She denies chest pain, dysuria, hematuria, blood in her stool. Last bowel movement was yesterday after and good one. She endorses baseline shortness of breath from emphysema.   Social history: She lives at home with her boyfriend. She formerly used tobacco, quitting 6-7 years ago. She denies, etoh, recreational drug use. She is retired and formerly Media planner a Insurance claims handler.  ROS: Constitutional: no weight change, no fever ENT/Mouth: no sore throat, no rhinorrhea Eyes: no eye pain, no vision changes Cardiovascular: no chest pain, no dyspnea,  no edema, no palpitations Respiratory: no cough, no sputum, no wheezing Gastrointestinal: no nausea, no vomiting, no  diarrhea, no constipation Genitourinary: no urinary incontinence, no dysuria, no hematuria Musculoskeletal: no arthralgias, no myalgias, + left hip pain Skin: no skin lesions, no pruritus, Neuro: + weakness, no loss of consciousness, no syncope Psych: no anxiety, no depression, no decrease appetite Heme/Lymph: no bruising, no bleeding  ED Course: Discussed with EDP, patient requiring hospitalization for chief concern of pain control.  Assessment/Plan  Principal Problem:   Inadequate pain control Active Problems:   Hyperlipidemia   PAD (peripheral artery disease) (HCC)   COPD (chronic obstructive pulmonary disease) (HCC)   Nicotine  dependence, uncomplicated   Essential hypertension   Diabetes (HCC)   Assessment and Plan:  * Inadequate pain control Secondary to left acute minimally displaced parasymphyseal fracture of left pubic body Symptomatic support: Oxycodone  5 mg p.o. every 6 hours as needed for moderate pain, 2 days ordered; morphine  2 mg IV every 4 hours as needed for sever pain, 1 day ordered AM team to reevaluate patient at bedside for continued opioid pain medication requirements  Essential hypertension Hydralazine  5 mg IV every 6 hours as needed for SBP greater 165, 5 days ordered  Nicotine  dependence, uncomplicated As did nicotine  patch  COPD (chronic obstructive pulmonary disease) (HCC) Continue baseline 4 L nasal cannula Does not appear to be in acute exacerbation Home long-acting inhaler equivalent resumed on admission DuoNebs every 6 hours as needed for shortness of breath, wheezing, 3 days ordered  PAD (peripheral artery disease) (HCC) Apixaban  5 mg p.o. twice daily, Plavix  75 mg daily resumed  Hyperlipidemia Home atorvastatin  40 mg nightly resumed  Chart reviewed.   Pending complete med reconciliation.  DVT prophylaxis: Apixaban  Code Status: Full code Diet: Heart  healthy Family Communication: A phone call was offered, patient declined stating  that her boyfriend already knows she is being admitted to the hospital Disposition Plan: Pending clinical course Consults called: PT, OT Admission status: Telemetry medical, observation  Past Medical History:  Diagnosis Date   COPD (chronic obstructive pulmonary disease) (HCC)    Hypertension    Nicotine  addiction    Oxygen  dependent    Peripheral vascular disease (HCC)    Past Surgical History:  Procedure Laterality Date   ABDOMINAL HYSTERECTOMY     COLONOSCOPY WITH PROPOFOL  N/A 02/21/2016   Procedure: COLONOSCOPY WITH PROPOFOL ;  Surgeon: Deveron Fly, MD;  Location: [facility] ENDOSCOPY;  Service: Endoscopy;  Laterality: N/A;   LOWER EXTREMITY ANGIOGRAPHY Right 05/28/2017   Procedure: Lower Extremity Angiography;  Surgeon: Jackquelyn Mass, MD;  Location: [facility] INVASIVE CV LAB;  Service: Cardiovascular;  Laterality: Right;   MASTOIDECTOMY     NECK SURGERY     PERIPHERAL VASCULAR CATHETERIZATION Left 05/29/2016   Procedure: Lower Extremity Angiography;  Surgeon: Jackquelyn Mass, MD;  Location: [facility] INVASIVE CV LAB;  Service: Cardiovascular;  Laterality: Left;   PERIPHERAL VASCULAR CATHETERIZATION  05/29/2016   Procedure: Lower Extremity Intervention;  Surgeon: Jackquelyn Mass, MD;  Location: [facility] INVASIVE CV LAB;  Service: Cardiovascular;;   TONSILLECTOMY     Social History:  reports that she has quit smoking. Her smoking use included cigarettes. She has a 22.5 pack-year smoking history. She has never used smokeless tobacco. She reports that she does not drink alcohol and does not use drugs.  No Known Allergies Family History  Problem Relation Age of Onset   COPD Mother    Hypertension Mother    COPD Father    Breast cancer Maternal Grandmother 84   Breast cancer Cousin 50       maternal side   Family history: Family history reviewed and not pertinent.  Prior to Admission medications   Medication Sig Start Date End Date Taking? Authorizing Provider  amLODipine  (NORVASC )  10 MG tablet Take 1 tablet (10 mg total) by mouth daily. 04/05/20 05/05/20  Garnet Just, MD  atorvastatin  (LIPITOR) 40 MG tablet Take 1 tablet (40 mg total) by mouth daily. 11/08/23   Shari Daughters, MD  cetirizine  (ZYRTEC ) 10 MG tablet TAKE 1 TABLET BY MOUTH EVERY DAY 09/20/23   Tejan-Sie, S Ahmed, MD  chlorthalidone (HYGROTON) 25 MG tablet TAKE 1/2 TABLET BY MOUTH DAILY AS DIRECTED 10/16/23   Shari Daughters, MD  clopidogrel  (PLAVIX ) 75 MG tablet Take 1 tablet (75 mg total) by mouth daily. 01/13/24   Shari Daughters, MD  ELIQUIS  5 MG TABS tablet TAKE 1 TABLET BY MOUTH TWICE A DAY 10/17/23   Cherrie Cornwall, MD  fluticasone  (FLONASE ) 50 MCG/ACT nasal spray Place 1 spray into both nostrils daily. 11/08/23 02/06/24  Shari Daughters, MD  Fluticasone -Umeclidin-Vilant (TRELEGY ELLIPTA ) 100-62.5-25 MCG/ACT AEPB USE AS DIRECTED 1 INHALATION PER DAY 06/11/23   Tejan-Sie, S Ahmed, MD  Ipratropium-Albuterol  (COMBIVENT  RESPIMAT) 20-100 MCG/ACT AERS respimat Inhale 1 puff into the lungs every 6 (six) hours as needed for wheezing. 11/08/23 05/06/24  Shari Daughters, MD  losartan  (COZAAR ) 25 MG tablet TAKE 1 TABLET BY MOUTH EVERY DAY 10/15/23   Tejan-Sie, S Ahmed, MD  OXYGEN  Inhale into the lungs.    [provider]   Physical Exam: Vitals:   01/21/24 1830 01/21/24 1945 01/21/24 2016 01/21/24 2055  BP:   134/71 107/70  Pulse: 61 73 95 75  Resp:   17   Temp:   98.2 F (36.8 C)   TempSrc:   Oral   SpO2: 98% 98% 96% 94%  Weight:      Height:       Constitutional: appears older than chronological age, NAD, calm Eyes: PERRL, lids and conjunctivae normal ENMT: Mucous membranes are moist. Posterior pharynx clear of any exudate or lesions. Age-appropriate dentition. Hearing appropriate Neck: normal, supple, no masses, no thyromegaly Respiratory: clear to auscultation bilaterally, no wheezing, no crackles. Normal respiratory effort. No accessory muscle use.  Cardiovascular: Regular rate and rhythm,  no murmurs / rubs / gallops. No extremity edema. 2+ pedal pulses. No carotid bruits.  Abdomen: no tenderness, no masses palpated, no hepatosplenomegaly. Bowel sounds positive.  Musculoskeletal: no clubbing / cyanosis. No joint deformity upper and lower extremities.  Decreased ROM of the left lower extremity, no contractures, no atrophy. Normal muscle tone.  Left hip pain. Skin: no rashes, lesions, ulcers. No induration Neurologic: Sensation intact. Strength 5/5 in all 4.  Psychiatric: Normal judgment and insight. Alert and oriented x 3. Normal mood.   EKG: independently reviewed, showing sinus tachycardia with frequent PVC rate 104, qTC 476  Chest x-ray on Admission: I personally reviewed and I agree with radiologist reading as below.  CT Hip Left Wo Contrast Result Date: 01/21/2024 CLINICAL DATA:  Hip trauma, fracture suspected, xray done EXAM: CT OF THE LEFT HIP WITHOUT CONTRAST TECHNIQUE: Multidetector CT imaging of the left hip was performed according to the standard protocol. Multiplanar CT image reconstructions were also generated. RADIATION DOSE REDUCTION: This exam was performed according to the departmental dose-optimization program which includes automated exposure control, adjustment of the mA and/or kV according to patient size and/or use of iterative reconstruction technique. COMPARISON:  Same day radiographs of the left hip dated 01/21/2024 at 4:54 p.m. FINDINGS: Bones/Joint/Cartilage Acute minimally displaced parasymphyseal fracture of the left pubic body. Pubic symphysis appears anatomically aligned. The remainder of the visualized bones appear intact. The left femoral head is seated within the acetabula with mild degenerative changes of the left hip. Ligaments Suboptimally assessed by CT. Muscles and Tendons Musculature is within normal limits.  No acute abnormality. Soft tissues No fluid collection or hematoma. Peripheral atherosclerotic vascular calcifications are noted. IMPRESSION:  Acute minimally displaced parasymphyseal fracture of the left pubic body. Pubic symphysis appears anatomically aligned. Electronically Signed   By: Mannie Seek M.D.   On: 01/21/2024 18:04   DG Hip Unilat W or Wo Pelvis 2-3 Views Left Result Date: 01/21/2024 CLINICAL DATA:  Left hip pain following a fall yesterday. EXAM: DG HIP (WITH OR WITHOUT PELVIS) 2-3V LEFT COMPARISON:  None Available. FINDINGS: Minimal bilateral hip degenerative changes. No fracture or dislocation seen. Bilateral common iliac artery stents and atheromatous calcifications. Lower lumbar spine degenerative changes. IMPRESSION: 1. No fracture. 2. Minimal bilateral hip degenerative changes. Electronically Signed   By: Catherin Closs M.D.   On: 01/21/2024 17:09   Labs on Admission: I have personally reviewed following labs  CBC: Recent Labs  Lab 01/21/24 1830  WBC 13.7*  NEUTROABS 11.8*  HGB 12.7  HCT 37.0  MCV 93.4  PLT 331   Basic Metabolic Panel: Recent Labs  Lab 01/21/24 1830  NA 130*  K 3.7  CL 80*  CO2 38*  GLUCOSE 117*  BUN 12  CREATININE 0.64  CALCIUM  9.0   GFR: Estimated Creatinine Clearance: 53 mL/min (by C-G formula based on SCr of 0.64 mg/dL).  Urine analysis:    Component  Value Date/Time   COLORURINE YELLOW (A) 03/30/2020 1925   APPEARANCEUR HAZY (A) 03/30/2020 1925   LABSPEC 1.005 03/30/2020 1925   PHURINE 6.0 03/30/2020 1925   GLUCOSEU NEGATIVE 03/30/2020 1925   HGBUR MODERATE (A) 03/30/2020 1925   BILIRUBINUR NEGATIVE 03/30/2020 1925   KETONESUR NEGATIVE 03/30/2020 1925   PROTEINUR NEGATIVE 03/30/2020 1925   NITRITE POSITIVE (A) 03/30/2020 1925   LEUKOCYTESUR TRACE (A) 03/30/2020 1925   This document was prepared using Dragon Voice Recognition software and may include unintentional dictation errors.  Dr. Reinhold Carbine [facility]  If 7PM-7AM, please contact overnight-coverage provider If 7AM-7PM, please contact day attending provider [email or web address]  01/21/2024, 9:29 PM

## 2024-01-21 NOTE — Assessment & Plan Note (Signed)
 Apixaban  5 mg p.o. twice daily, Plavix  75 mg daily resumed

## 2024-01-21 NOTE — Progress Notes (Signed)
 Patient noted to have irregular uncontrolled HR starting at 2030 (ranges 70-160s per telemetry tech).  STAT EKG ordered reading Sinus Tach.  Rate remained uncontrolled with unsustained ranges remaining 70s-160s; provider notified via secure chat; STAT EKG ordered reading A-fib.  Will monitor for elevated HR >120 for 10-20 minutes per provider.  Plan of care continues.

## 2024-01-21 NOTE — Assessment & Plan Note (Signed)
 Secondary to left acute minimally displaced parasymphyseal fracture of left pubic body Symptomatic support: Oxycodone  5 mg p.o. every 6 hours as needed for moderate pain, 2 days ordered; morphine  2 mg IV every 4 hours as needed for sever pain, 1 day ordered AM team to reevaluate patient at bedside for continued opioid pain medication requirements

## 2024-01-22 ENCOUNTER — Other Ambulatory Visit: Payer: Self-pay | Admitting: Cardiovascular Disease

## 2024-01-22 DIAGNOSIS — E1159 Type 2 diabetes mellitus with other circulatory complications: Secondary | ICD-10-CM

## 2024-01-22 DIAGNOSIS — I4891 Unspecified atrial fibrillation: Secondary | ICD-10-CM

## 2024-01-22 DIAGNOSIS — J441 Chronic obstructive pulmonary disease with (acute) exacerbation: Secondary | ICD-10-CM

## 2024-01-22 DIAGNOSIS — I70213 Atherosclerosis of native arteries of extremities with intermittent claudication, bilateral legs: Secondary | ICD-10-CM

## 2024-01-22 DIAGNOSIS — E782 Mixed hyperlipidemia: Secondary | ICD-10-CM

## 2024-01-22 DIAGNOSIS — I6523 Occlusion and stenosis of bilateral carotid arteries: Secondary | ICD-10-CM

## 2024-01-22 DIAGNOSIS — R0602 Shortness of breath: Secondary | ICD-10-CM

## 2024-01-22 DIAGNOSIS — I739 Peripheral vascular disease, unspecified: Secondary | ICD-10-CM

## 2024-01-22 DIAGNOSIS — I10 Essential (primary) hypertension: Secondary | ICD-10-CM

## 2024-01-22 DIAGNOSIS — R52 Pain, unspecified: Secondary | ICD-10-CM | POA: Diagnosis not present

## 2024-01-22 LAB — CBC
HCT: 33 % — ABNORMAL LOW (ref 36.0–46.0)
Hemoglobin: 11.5 g/dL — ABNORMAL LOW (ref 12.0–15.0)
MCH: 32.3 pg (ref 26.0–34.0)
MCHC: 34.8 g/dL (ref 30.0–36.0)
MCV: 92.7 fL (ref 80.0–100.0)
Platelets: 289 10*3/uL (ref 150–400)
RBC: 3.56 MIL/uL — ABNORMAL LOW (ref 3.87–5.11)
RDW: 13 % (ref 11.5–15.5)
WBC: 7.2 10*3/uL (ref 4.0–10.5)
nRBC: 0 % (ref 0.0–0.2)

## 2024-01-22 LAB — BASIC METABOLIC PANEL WITH GFR
Anion gap: 7 (ref 5–15)
BUN: 16 mg/dL (ref 8–23)
CO2: 39 mmol/L — ABNORMAL HIGH (ref 22–32)
Calcium: 8.2 mg/dL — ABNORMAL LOW (ref 8.9–10.3)
Chloride: 85 mmol/L — ABNORMAL LOW (ref 98–111)
Creatinine, Ser: 0.66 mg/dL (ref 0.44–1.00)
GFR, Estimated: >60 mL/min (ref >60)
Glucose, Bld: 110 mg/dL — ABNORMAL HIGH (ref 70–99)
Potassium: 3.4 mmol/L — ABNORMAL LOW (ref 3.5–5.1)
Sodium: 131 mmol/L — ABNORMAL LOW (ref 135–145)

## 2024-01-22 LAB — HIV ANTIBODY (ROUTINE TESTING W REFLEX): HIV Screen 4th Generation wRfx: NONREACTIVE

## 2024-01-22 MED ORDER — METOPROLOL TARTRATE 25 MG PO TABS
12.5000 mg | ORAL_TABLET | Freq: Two times a day (BID) | ORAL | Status: DC
Start: 2024-01-22 — End: 2024-01-27
  Administered 2024-01-22 – 2024-01-27 (×11): 12.5 mg via ORAL
  Filled 2024-01-22 (×11): qty 1

## 2024-01-22 MED ORDER — MORPHINE SULFATE (PF) 2 MG/ML IV SOLN
2.0000 mg | INTRAVENOUS | Status: DC | PRN
  Administered 2024-01-25: 2 mg via INTRAVENOUS
  Filled 2024-01-22: qty 1

## 2024-01-22 MED ORDER — OXYCODONE HCL 5 MG PO TABS
5.0000 mg | ORAL_TABLET | ORAL | Status: DC | PRN
  Administered 2024-01-23 – 2024-01-24 (×2): 5 mg via ORAL
  Administered 2024-01-24: 10 mg via ORAL
  Administered 2024-01-24 – 2024-01-25 (×2): 5 mg via ORAL
  Filled 2024-01-22 (×2): qty 2
  Filled 2024-01-22 (×5): qty 1

## 2024-01-22 MED ORDER — ACETAMINOPHEN 500 MG PO TABS
1000.0000 mg | ORAL_TABLET | Freq: Three times a day (TID) | ORAL | Status: DC
  Administered 2024-01-22 – 2024-01-27 (×15): 1000 mg via ORAL
  Filled 2024-01-22 (×15): qty 2

## 2024-01-22 MED ORDER — KETOROLAC TROMETHAMINE 15 MG/ML IJ SOLN
15.0000 mg | Freq: Four times a day (QID) | INTRAMUSCULAR | Status: DC
  Administered 2024-01-22 – 2024-01-24 (×7): 15 mg via INTRAVENOUS
  Filled 2024-01-22 (×7): qty 1

## 2024-01-22 NOTE — TOC Initial Note (Signed)
 Transition of Care [facility]) - Initial/Assessment Note    Patient Details  Name: Wendy Ferrell MRN: [id number] Date of Birth: 1956-07-23  [facility] CM/SW Contact:    Alexandra Ice, RN Phone Number: 01/22/2024, 2:38 PM  Clinical Narrative:                 Spoke with patient, declines SNF, wants to go home. She is agreeable to DME and [facility] services. No preference on agencies to use. DME orders sent to Jon with [employer] for processing. Sent HH referral to Shaun with [facility], he is able to accept. Patient EDD 01/23/2024 pending pain and heart rate control per MD.   Expected Discharge Plan: Home w Home Health Services Barriers to Discharge: Continued Medical Work up   Patient Goals and CMS Choice Patient states their goals for this hospitalization and ongoing recovery are:: get better CMS Medicare.gov Compare Post Acute Care list provided to:: Patient Choice offered to / list presented to : Patient      Expected Discharge Plan and Services   Discharge Planning Services: CM Consult Post Acute Care Choice: Home Health, Durable Medical Equipment Living arrangements for the past 2 months: Mobile Home                 DME Arranged: Walker rolling, Bedside commode DME Agency: [facility] Date DME Agency Contacted: 01/22/24 Time DME Agency Contacted: [phone] Representative spoke with at DME Agency: Sam Creighton HH Arranged: PT, OT HH Agency: [facility] ([facility]) Date HH Agency Contacted: 01/22/24 Time HH Agency Contacted: [phone] Representative spoke with at [facility] Agency: Shaun  Prior Living Arrangements/Services Living arrangements for the past 2 months: Mobile Home Lives with:: Significant Other Patient language and need for interpreter reviewed:: Yes Do you feel safe going back to the place where you live?: Yes      Need for Family Participation in Patient Care: Yes (Comment) Care giver support system in place?: Yes (comment)   Criminal Activity/Legal Involvement  Pertinent to Current Situation/Hospitalization: No - Comment as needed  Activities of Daily Living   ADL Screening (condition at time of admission) Independently performs ADLs?: Yes (appropriate for developmental age) Is the patient deaf or have difficulty hearing?: No Does the patient have difficulty seeing, even when wearing glasses/contacts?: No Does the patient have difficulty concentrating, remembering, or making decisions?: No  Permission Sought/Granted                  Emotional Assessment Appearance:: Appears stated age Attitude/Demeanor/Rapport: Engaged Affect (typically observed): Accepting Orientation: : Oriented to Self, Oriented to Place, Oriented to  Time, Oriented to Situation   Psych Involvement: No (comment)  Admission diagnosis:  Inadequate pain control [R52] Closed fracture of left pubis, unspecified portion of pubis, initial encounter [facility]) [S32.502A] Patient Active Problem List   Diagnosis Date Noted   Inadequate pain control 01/21/2024   Chronic respiratory failure with hypoxia (HCC) 10/24/2022   Essential hypertension 08/22/2022   Diabetes (HCC) 08/22/2022   Hyponatremia 03/30/2020   COPD with acute exacerbation (HCC) 09/30/2018   Leg pain 05/27/2017   COPD (chronic obstructive pulmonary disease) (HCC) 12/27/2016   Atherosclerosis of native arteries of extremity with intermittent claudication (HCC) 09/20/2016   Syncope 09/20/2016   Bilateral carotid artery stenosis 07/30/2016   Hyperlipidemia 06/19/2016   PAD (peripheral artery disease) (HCC) 06/19/2016   Shoulder pain 06/04/2016   Community acquired pneumonia 02/14/2015   Nicotine  addiction 02/14/2015   Pneumonia 02/14/2015   Nicotine  dependence, uncomplicated 02/14/2015  Chronic obstructive pulmonary disease with (acute) exacerbation (HCC) 01/24/2015   PCP:  Shari Daughters, MD Pharmacy:   [id number] - [place], [place] - [address] [place] [address] Phone:  [phone] Fax: [phone]     Social Drivers of Health (SDOH) Social History: SDOH Screenings   Food Insecurity: No Food Insecurity (01/22/2024)  Housing: Low Risk  (01/22/2024)  Transportation Needs: No Transportation Needs (01/22/2024)  Utilities: Not At Risk (01/22/2024)  Depression (PHQ2-9): Low Risk  (07/04/2023)  Social Connections: Moderately Integrated (01/22/2024)  Tobacco Use: Medium Risk (01/21/2024)   SDOH Interventions:     Readmission Risk Interventions     No data to display

## 2024-01-22 NOTE — Evaluation (Signed)
 Physical Therapy Evaluation Patient Details Name: Wendy Ferrell MRN: [id number] DOB: April 02, 1956 Today's Date: 01/22/2024  History of Present Illness  Wendy Ferrell is a 68 year old female with history of COPD, non insulin-dependent diabetes melitis, hypertension, PVD on Eliquis , COPD, chronic hypoxic respiratory failure requiring baseline 4 L nasal cannula at baseline, who presents emergency department for chief concerns of a fall yesterday. L Hip X-Wendy Ferrell:  left acute minimally displaced parasymphyseal fracture of left pubic body  Clinical Impression  Pt is a pleasant 68 year old female who was admitted for inadequate pain control along with L pubic fx. Pt performs bed mobility/transfers with min assist and unable to ambulate at this time. Pt demonstrates deficits with strength/mobility/pain. Pt is very anxious with mobility with high levels of pain. Discussed with boyfriend at bedside what she would need to perform prior to safe dc home including 24/7 supervision and will need physical assistance for safe mobility. Would benefit from skilled PT to address above deficits and promote optimal return to PLOF. Pt will continue to receive skilled PT services while admitted and will defer to TOC/care team for updates regarding disposition planning.         If plan is discharge home, recommend the following: A lot of help with walking and/or transfers;A lot of help with bathing/dressing/bathroom;Assist for transportation;Help with stairs or ramp for entrance   Can travel by private vehicle        Equipment Recommendations Rolling walker (2 wheels);BSC/3in1  Recommendations for Other Services       Functional Status Assessment Patient has had a recent decline in their functional status and demonstrates the ability to make significant improvements in function in a reasonable and predictable amount of time.     Precautions / Restrictions Precautions Precautions: Fall Recall of  Precautions/Restrictions: Impaired Restrictions Weight Bearing Restrictions Per Provider Order: Yes LLE Weight Bearing Per Provider Order: Weight bearing as tolerated      Mobility  Bed Mobility Overal bed mobility: Needs Assistance Bed Mobility: Sit to Supine       Sit to supine: Min assist   General bed mobility comments: needs assist for B LE movement and repositioning    Transfers Overall transfer level: Needs assistance Equipment used: Rolling walker (2 wheels) Transfers: Bed to chair/wheelchair/BSC, Sit to/from Stand Sit to Stand: Min assist   Step pivot transfers: Min assist       General transfer comment: needs heavy encouragement to perform due to anxiety and SOB symptoms. All mobility performed on 4L with sats at 89-90% throughout exertion. HR does elevate to 160s and pt needs copious rest break between transitions due to quick fatigue. Very limited WBing on L LE and prefers to keep off ground with hop to transfer    Ambulation/Gait               General Gait Details: unable to tolerate at this time  Stairs            Wheelchair Mobility     Tilt Bed    Modified Rankin (Stroke Patients Only)       Balance Overall balance assessment: Needs assistance Sitting-balance support: Bilateral upper extremity supported, Feet supported Sitting balance-Leahy Scale: Fair     Standing balance support: Bilateral upper extremity supported Standing balance-Leahy Scale: Poor                               Pertinent Vitals/Pain Pain Assessment Pain Assessment:  0-10 Pain Score: 9  Pain Location: L hip Pain Descriptors / Indicators: Grimacing, Guarding Pain Intervention(s): Limited activity within patient's tolerance, Repositioned, Premedicated before session    Home Living Family/patient expects to be discharged to:: Private residence Living Arrangements: Spouse/significant other Available Help at Discharge: Family;Available  PRN/intermittently Type of Home: Mobile home Home Access: Stairs to enter Entrance Stairs-Rails: Can reach both Entrance Stairs-Number of Steps: 6   Home Layout: One level Home Equipment: None Additional Comments: lives with boyfriend who works during the day. Boyfriend reports pt has 2 daughters who are also available for assistance    Prior Function Prior Level of Function : Independent/Modified Independent             Mobility Comments: household ambulator no AD, 4L chronic [place] ADLs Comments: boyfriend does IADLs     Extremity/Trunk Assessment   Upper Extremity Assessment Upper Extremity Assessment: Overall WFL for tasks assessed    Lower Extremity Assessment Lower Extremity Assessment: Generalized weakness (limited due to pain)       Communication   Communication Communication: No apparent difficulties    Cognition Arousal: Alert Behavior During Therapy: Anxious   PT - Cognitive impairments: No apparent impairments                       PT - Cognition Comments: pleasant and anxious with all mobility Following commands: Impaired Following commands impaired: Follows one step commands with increased time     Cueing Cueing Techniques: Verbal cues, Tactile cues     General Comments General comments (skin integrity, edema, etc.): HR 95-170 bpm, SpO2 88-95% on 4L [place]    Exercises     Assessment/Plan    PT Assessment Patient needs continued PT services  PT Problem List Decreased strength;Decreased balance;Decreased mobility;Decreased knowledge of use of DME;Decreased safety awareness;Cardiopulmonary status limiting activity;Pain;Decreased activity tolerance       PT Treatment Interventions DME instruction;Gait training;Stair training;Therapeutic exercise;Balance training    PT Goals (Current goals can be found in the Care Plan section)  Acute Rehab PT Goals Patient Stated Goal: to go home PT Goal Formulation: With patient Time For Goal  Achievement: 02/05/24 Potential to Achieve Goals: Good    Frequency 7X/week     Co-evaluation               AM-PAC PT "6 Clicks" Mobility  Outcome Measure Help needed turning from your back to your side while in a flat bed without using bedrails?: A Little Help needed moving from lying on your back to sitting on the side of a flat bed without using bedrails?: A Little Help needed moving to and from a bed to a chair (including a wheelchair)?: A Little Help needed standing up from a chair using your arms (e.g., wheelchair or bedside chair)?: A Little Help needed to walk in hospital room?: A Lot Help needed climbing 3-5 steps with a railing? : Total 6 Click Score: 15    End of Session Equipment Utilized During Treatment: Gait belt;Oxygen  Activity Tolerance: Patient limited by pain Patient left: in bed;with bed alarm set;with call bell/phone within reach;with family/visitor present Nurse Communication: Mobility status PT Visit Diagnosis: Unsteadiness on feet (R26.81);Muscle weakness (generalized) (M62.81);History of falling (Z91.81);Difficulty in walking, not elsewhere classified (R26.2);Pain Pain - Right/Left: Left Pain - part of body: Hip    Time: 5284-1324 PT Time Calculation (min) (ACUTE ONLY): 28 min   Charges:   PT Evaluation $PT Eval Low Complexity: 1 Low PT Treatments $Therapeutic  Activity: 8-22 mins PT General Charges $$ ACUTE PT VISIT: 1 Visit         Amparo Balk, PT, DPT, GCS [phone]   Wendy Ferrell 01/22/2024, 12:58 PM

## 2024-01-22 NOTE — Progress Notes (Cosign Needed Addendum)
 Patient is not able to walk the distance required to go the bathroom, or he/she is unable to safely negotiate stairs required to access the bathroom.  A 3in1 BSC will alleviate this problem

## 2024-01-22 NOTE — Care Management Obs Status (Signed)
 MEDICARE OBSERVATION STATUS NOTIFICATION   Patient Details  Name: Wendy Ferrell MRN: [id number] Date of Birth: [birth year]   Medicare Observation Status Notification Given:  Rudolph Cost, CMA 01/22/2024, 2:35 PM

## 2024-01-22 NOTE — Plan of Care (Signed)

## 2024-01-22 NOTE — Progress Notes (Signed)
 PROGRESS NOTE    Wendy Ferrell  ZOX:[id number] DOB: 1955/12/18 DOA: 01/21/2024 PCP: Shari Daughters, MD    Brief Narrative:  68 year old female with history of COPD, non insulin-dependent diabetes melitis, hypertension, PVD on Eliquis , COPD, chronic hypoxic respiratory failure requiring baseline 4 L nasal cannula at baseline, who presents emergency department for chief concerns of a fall yesterday.   Non displaced pelvic fracture noted on imaging.  No surgical intervention warranted.  Weightbearing as tolerated.  Therapy evaluations requested.  Pain control.   Assessment & Plan:   Principal Problem:   Inadequate pain control Active Problems:   Hyperlipidemia   PAD (peripheral artery disease) (HCC)   COPD (chronic obstructive pulmonary disease) (HCC)   Nicotine  dependence, uncomplicated   Essential hypertension   Diabetes (HCC)  * Inadequate pain control Acute pelvic fracture No surgical indications.  Patient having difficulty with mobility efforts Plan: Multimodal pain control Continue therapy evaluations  Paroxysmal atrial fibrillation with rapid ventricular response Rapid rate likely influenced by inadequate pain control Plan: Start metoprolol  12.5 mg p.o. twice daily, escalate as needed Continue Eliquis    Essential hypertension Low-dose metoprolol  added as above   Nicotine  dependence, uncomplicated As needed nicotine  patch   COPD (chronic obstructive pulmonary disease) (HCC) Continue baseline 4 L nasal cannula Does not appear to be in acute exacerbation Home long-acting inhaler equivalent resumed on admission DuoNebs every 6 hours as needed for shortness of breath, wheezing, 3 days ordered   PAD (peripheral artery disease) (HCC) Apixaban  5 mg p.o. twice daily, Plavix  75 mg daily resumed   Hyperlipidemia Home atorvastatin  40 mg nightly resumed   DVT prophylaxis: Eliquis  Code Status: Full Family Communication: Significant other at bedside  5/14 Disposition Plan: Status is: Observation The patient will require care spanning > 2 midnights and should be moved to inpatient because: Inadequate pain control.  Rapid atrial fibrillation   Level of care: Telemetry Medical  Consultants:  None  Procedures:  None  Antimicrobials: None   Subjective: Seen and examined.  Resting in bed.  Main complaint is pain.  Inability to ambulate.  Objective: Vitals:   01/21/24 2016 01/21/24 2055 01/22/24 0356 01/22/24 0805  BP: 134/71 107/70 137/67 (!) 115/55  Pulse: 95 75 93 80  Resp: 17  18 18   Temp: 98.2 F (36.8 C)  98.1 F (36.7 C) 98.4 F (36.9 C)  TempSrc: Oral     SpO2: 96% 94% 94% 100%  Weight:      Height:        Intake/Output Summary (Last 24 hours) at 01/22/2024 1236 Last data filed at 01/22/2024 0831 Gross per 24 hour  Intake --  Output 200 ml  Net -200 ml   Filed Weights   01/21/24 1624  Weight: 48.5 kg    Examination:  General exam: Appears calm and comfortable  Respiratory system: Diminished breath sounds bilaterally.  Mild crackles.  Normal work of breathing.  4 L Cardiovascular system: S1-S2, rapid rate, irregular rhythm, no murmurs, no pedal edema Gastrointestinal system: Thin, soft, NT/ND, normal bowel sounds Central nervous system: Alert and oriented. No focal neurological deficits. Extremities: Symmetric decreased power bilateral lower extremities.  Gait not assessed Skin: No rashes, lesions or ulcers Psychiatry: Judgement and insight appear normal. Mood & affect appropriate.     Data Reviewed: I have personally reviewed following labs and imaging studies  CBC: Recent Labs  Lab 01/21/24 1830 01/22/24 0224  WBC 13.7* 7.2  NEUTROABS 11.8*  --   HGB 12.7 11.5*  HCT  37.0 33.0*  MCV 93.4 92.7  PLT 331 289   Basic Metabolic Panel: Recent Labs  Lab 01/21/24 1830 01/22/24 0224  NA 130* 131*  K 3.7 3.4*  CL 80* 85*  CO2 38* 39*  GLUCOSE 117* 110*  BUN 12 16  CREATININE 0.64 0.66   CALCIUM  9.0 8.2*   GFR: Estimated Creatinine Clearance: 53 mL/min (by C-G formula based on SCr of 0.66 mg/dL). Liver Function Tests: No results for input(s): "AST", "ALT", "ALKPHOS", "BILITOT", "PROT", "ALBUMIN" in the last 168 hours. No results for input(s): "LIPASE", "AMYLASE" in the last 168 hours. No results for input(s): "AMMONIA" in the last 168 hours. Coagulation Profile: No results for input(s): "INR", "PROTIME" in the last 168 hours. Cardiac Enzymes: No results for input(s): "CKTOTAL", "CKMB", "CKMBINDEX", "TROPONINI" in the last 168 hours. BNP (last 3 results) No results for input(s): "PROBNP" in the last 8760 hours. HbA1C: No results for input(s): "HGBA1C" in the last 72 hours. CBG: No results for input(s): "GLUCAP" in the last 168 hours. Lipid Profile: No results for input(s): "CHOL", "HDL", "LDLCALC", "TRIG", "CHOLHDL", "LDLDIRECT" in the last 72 hours. Thyroid  Function Tests: No results for input(s): "TSH", "T4TOTAL", "FREET4", "T3FREE", "THYROIDAB" in the last 72 hours. Anemia Panel: No results for input(s): "VITAMINB12", "FOLATE", "FERRITIN", "TIBC", "IRON", "RETICCTPCT" in the last 72 hours. Sepsis Labs: No results for input(s): "PROCALCITON", "LATICACIDVEN" in the last 168 hours.  No results found for this or any previous visit (from the past 240 hours).       Radiology Studies: CT Hip Left Wo Contrast Result Date: 01/21/2024 CLINICAL DATA:  Hip trauma, fracture suspected, xray done EXAM: CT OF THE LEFT HIP WITHOUT CONTRAST TECHNIQUE: Multidetector CT imaging of the left hip was performed according to the standard protocol. Multiplanar CT image reconstructions were also generated. RADIATION DOSE REDUCTION: This exam was performed according to the departmental dose-optimization program which includes automated exposure control, adjustment of the mA and/or kV according to patient size and/or use of iterative reconstruction technique. COMPARISON:  Same day  radiographs of the left hip dated 01/21/2024 at 4:54 p.m. FINDINGS: Bones/Joint/Cartilage Acute minimally displaced parasymphyseal fracture of the left pubic body. Pubic symphysis appears anatomically aligned. The remainder of the visualized bones appear intact. The left femoral head is seated within the acetabula with mild degenerative changes of the left hip. Ligaments Suboptimally assessed by CT. Muscles and Tendons Musculature is within normal limits.  No acute abnormality. Soft tissues No fluid collection or hematoma. Peripheral atherosclerotic vascular calcifications are noted. IMPRESSION: Acute minimally displaced parasymphyseal fracture of the left pubic body. Pubic symphysis appears anatomically aligned. Electronically Signed   By: Mannie Seek M.D.   On: 01/21/2024 18:04   DG Hip Unilat W or Wo Pelvis 2-3 Views Left Result Date: 01/21/2024 CLINICAL DATA:  Left hip pain following a fall yesterday. EXAM: DG HIP (WITH OR WITHOUT PELVIS) 2-3V LEFT COMPARISON:  None Available. FINDINGS: Minimal bilateral hip degenerative changes. No fracture or dislocation seen. Bilateral common iliac artery stents and atheromatous calcifications. Lower lumbar spine degenerative changes. IMPRESSION: 1. No fracture. 2. Minimal bilateral hip degenerative changes. Electronically Signed   By: Catherin Closs M.D.   On: 01/21/2024 17:09        Scheduled Meds:  acetaminophen   1,000 mg Oral TID   apixaban   5 mg Oral BID   atorvastatin   40 mg Oral QHS   budesonide -glycopyrrolate-formoterol   2 puff Inhalation BID   clopidogrel   75 mg Oral Daily   docusate sodium   100 mg Oral BID   ketorolac  15 mg Intravenous Q6H   losartan   25 mg Oral Daily   metoprolol  tartrate  12.5 mg Oral BID   Continuous Infusions:   LOS: 0 days      Tiajuana Fluke, MD [facility]   If 7PM-7AM, please contact night-coverage  01/22/2024, 12:36 PM

## 2024-01-22 NOTE — Progress Notes (Signed)
 Wendy Ferrell is a new admission to our unit. She was placed on tele and central tele called and stated pt is going into A FIB. Dr. Reinhold Carbine ordered a EKG. EKG shows sinus tach with pvc. However, I believe it rhythm is AFIB. HR is varying from 70s-120s at rest. Pt states she was newly diagnosed with AFIB back 2 months ago. Per previous note pt was diagnosed in November 2024 with AFIB. Guillermo Lees, NP was notified via secure chat and she called me. No new orders at that time and she stated we didn`t need to treat the rhythm unless she was in RVR and sustaining in the 150s. (01/21/24 2118) Lindon Rhine, charge nurse reached back out to [facility], NP to see if we could have a prn order for rate control since tele called back around 2145 starting pt is tachycardic in the upwards of 150s but not sustaining. New order for EKG and that pt may possibly be transferred to 2A if EKG confirmed AFIB with RVR. Tele tech stated rhythm is MAT. Pt denies having SOB. Pt remains on 4L O2. EKG was collected again and showed AFIB. No new orders since rate appears controlled. Ouma, NP stated to let her know if pt is sustaining rate >120 at least for more than 10-20 minute.

## 2024-01-22 NOTE — Progress Notes (Addendum)
 Tele called.  Pt in intervals of SVT 170s-180s, a few beats of Vtach, and atrial fibrillation. Pt was working with OT at the time as well. Notified MD. Waiting for orders.  Pt is in bed and at rest now  No other S/S, but pt states in pain 10/10 from fracture.  Will continue to monitor.

## 2024-01-22 NOTE — Discharge Instructions (Signed)
 [facility] They will call you to set up when they are coming out to see you   [address], [place], [place] [address] Hours:  Open ? Closes 5?PM Phone: [phone]        Instructions after Total Knee Replacement   Reinaldo Berber M.D.     Dept. of Orthopaedics & Sports Medicine  [facility]  [address]  [place], [place]  [address]  Phone: [phone]   Fax: [phone]    DIET: Drink plenty of non-alcoholic fluids. Resume your normal diet. Include foods high in fiber.  ACTIVITY:  You may use crutches or a walker with weight-bearing as tolerated, unless instructed otherwise. You may be weaned off of the walker or crutches by your Physical Therapist.  Do NOT place pillows under the knee. Anything placed under the knee could limit your ability to straighten the knee.   Continue doing gentle exercises. Exercising will reduce the pain and swelling, increase motion, and prevent muscle weakness.   Please continue to use the TED compression stockings for 2 weeks. You may remove the stockings at night, but should reapply them in the morning. Do not drive or operate any equipment until instructed.  WOUND CARE:  Continue to use the PolarCare or ice packs periodically to reduce pain and swelling. You may begin showering 3 days after surgery with honeycomb dressing. Remove honeycomb dressing 7 days after surgery and continue showering. Allow dermabond to fall off on its own.  MEDICATIONS: You may resume your regular medications. Please take the pain medication as prescribed on the medication. Do not take pain medication on an empty stomach. You have been given a prescription for a blood thinner (Lovenox or Coumadin). Please take the medication as instructed. (NOTE: After completing a 2 week course of Lovenox, take one 81 mg Enteric-coated aspirin twice a day for 3 additional weeks. This along with elevation will help reduce the possibility of phlebitis in your operated  leg.) Do not drive or drink alcoholic beverages when taking pain medications.  POSTOPERATIVE CONSTIPATION PROTOCOL Constipation - defined medically as fewer than three stools per week and severe constipation as less than one stool per week.  One of the most common issues patients have following surgery is constipation.  Even if you have a regular bowel pattern at home, your normal regimen is likely to be disrupted due to multiple reasons following surgery.  Combination of anesthesia, postoperative narcotics, change in appetite and fluid intake all can affect your bowels.  In order to avoid complications following surgery, here are some recommendations in order to help you during your recovery period.  Colace (docusate) - Pick up an over-the-counter form of Colace or another stool softener and take twice a day as long as you are requiring postoperative pain medications.  Take with a full glass of water daily.  If you experience loose stools or diarrhea, hold the colace until you stool forms back up.  If your symptoms do not get better within 1 week or if they get worse, check with your doctor.  Dulcolax (bisacodyl) - Pick up over-the-counter and take as directed by the product packaging as needed to assist with the movement of your bowels.  Take with a full glass of water.  Use this product as needed if not relieved by Colace only.   MiraLax (polyethylene glycol) - Pick up over-the-counter to have on hand.  MiraLax is a solution that will increase the amount of water in your bowels to assist with  bowel movements.  Take as directed and can mix with a glass of water, juice, soda, coffee, or tea.  Take if you go more than two days without a movement. Do not use MiraLax more than once per day. Call your doctor if you are still constipated or irregular after using this medication for 7 days in a row.  If you continue to have problems with postoperative constipation, please contact the office for further  assistance and recommendations.  If you experience "the worst abdominal pain ever" or develop nausea or vomiting, please contact the office immediatly for further recommendations for treatment.   CALL THE OFFICE FOR: Temperature above 101 degrees Excessive bleeding or drainage on the dressing. Excessive swelling, coldness, or paleness of the toes. Persistent nausea and vomiting.  FOLLOW-UP:  You should have an appointment to return to the office in 14 days after surgery. Arrangements have been made for continuation of Physical Therapy (either home therapy or outpatient therapy).

## 2024-01-22 NOTE — Evaluation (Signed)
 Occupational Therapy Evaluation Patient Details Name: Wendy Ferrell MRN: [id number] DOB: 10-15-1955 Today's Date: 01/22/2024   History of Present Illness   Wendy Ferrell is a 68 year old female with history of COPD, non insulin-dependent diabetes melitis, hypertension, PVD on Eliquis , COPD, chronic hypoxic respiratory failure requiring baseline 4 L nasal cannula at baseline, who presents emergency department for chief concerns of a fall yesterday. L Hip X-Ray:  left acute minimally displaced parasymphyseal fracture of left pubic body    Clinical Impressions Wendy Ferrell was seen for OT evaluation this date. Prior to hospital admission, pt was MOD I using 4L [place] for household distances. Pt lives with boyfriend who works during daytime. Pt currently requires no assist to exit bed, MIN A + RW for BSC t/f, MAX A pericare in sitting. SUPERVISION self-feeding in chair. With activity HR 95-170 bpm, SpO2 88-95% on 4L [place]. RN notified.   Pt would benefit from skilled OT to address noted impairments and functional limitations (see below for any additional details). Upon hospital discharge, recommend OT follow up <3 hours/day. Per conversation with PT, family reported plan to be home 24/7 and prefer to return home, will recommend [facility] and RW and 24/7 SUPERVISION if decide to return home.    If plan is discharge home, recommend the following:   A lot of help with walking and/or transfers;A lot of help with bathing/dressing/bathroom;Help with stairs or ramp for entrance     Functional Status Assessment   Patient has had a recent decline in their functional status and demonstrates the ability to make significant improvements in function in a reasonable and predictable amount of time.     Equipment Recommendations   BSC/3in1     Recommendations for Other Services         Precautions/Restrictions   Precautions Precautions: Fall Recall of Precautions/Restrictions: Impaired Restrictions Weight  Bearing Restrictions Per Provider Order: Yes LLE Weight Bearing Per Provider Order: Weight bearing as tolerated     Mobility Bed Mobility Overal bed mobility: Needs Assistance Bed Mobility: Supine to Sit     Supine to sit: Contact guard          Transfers Overall transfer level: Needs assistance Equipment used: Rolling walker (2 wheels) Transfers: Bed to chair/wheelchair/BSC, Sit to/from Stand Sit to Stand: Min assist Stand pivot transfers: Min assist                Balance Overall balance assessment: Needs assistance Sitting-balance support: Bilateral upper extremity supported, Feet supported Sitting balance-Leahy Scale: Fair     Standing balance support: Bilateral upper extremity supported Standing balance-Leahy Scale: Poor                             ADL either performed or assessed with clinical judgement   ADL Overall ADL's : Needs assistance/impaired                                       General ADL Comments: MIN A + RW for BSC t/f, MAX A pericare in sitting. SUPERVISION self-feeding in chair.      Vision         Perception         Praxis         Pertinent Vitals/Pain Pain Assessment Pain Assessment: 0-10 Pain Score: 10-Worst pain ever Pain Location: L hip Pain Descriptors / Indicators: Grimacing,  Guarding Pain Intervention(s): Premedicated before session, Patient requesting pain meds-RN notified, Repositioned     Extremity/Trunk Assessment Upper Extremity Assessment Upper Extremity Assessment: Overall WFL for tasks assessed   Lower Extremity Assessment Lower Extremity Assessment: Generalized weakness       Communication Communication Communication: No apparent difficulties   Cognition Arousal: Alert Behavior During Therapy: Anxious Cognition: No apparent impairments                               Following commands: Impaired Following commands impaired: Follows one step commands with  increased time     Cueing  General Comments   Cueing Techniques: Verbal cues;Tactile cues  HR 95-170 bpm, SpO2 88-95% on 4L [place]   Exercises     Shoulder Instructions      Home Living Family/patient expects to be discharged to:: Private residence Living Arrangements: Spouse/significant other Available Help at Discharge: Family;Available PRN/intermittently Type of Home: Mobile home Home Access: Stairs to enter Entrance Stairs-Number of Steps: 6 Entrance Stairs-Rails: Can reach both Home Layout: One level               Home Equipment: None   Additional Comments: lives with boyfriend who works during the day      Prior Functioning/Environment Prior Level of Function : Independent/Modified Independent             Mobility Comments: household ambulator no AD, 4L chronic [place] ADLs Comments: boyfriend does IADLs    OT Problem List: Decreased strength;Decreased range of motion;Decreased activity tolerance;Impaired balance (sitting and/or standing);Pain   OT Treatment/Interventions: Self-care/ADL training;Therapeutic exercise;Energy conservation;DME and/or AE instruction;Therapeutic activities;Balance training;Patient/family education      OT Goals(Current goals can be found in the care plan section)   Acute Rehab OT Goals Patient Stated Goal: to go home OT Goal Formulation: With patient Time For Goal Achievement: 02/05/24 Potential to Achieve Goals: Good ADL Goals Pt Will Perform Grooming: with modified independence;sitting Pt Will Perform Lower Body Dressing: with modified independence;sit to/from stand Pt Will Transfer to Toilet: with modified independence;ambulating;regular height toilet   OT Frequency:  Min 2X/week    Co-evaluation              AM-PAC OT "6 Clicks" Daily Activity     Outcome Measure Help from another person eating meals?: None Help from another person taking care of personal grooming?: A Little Help from another person toileting,  which includes using toliet, bedpan, or urinal?: A Lot Help from another person bathing (including washing, rinsing, drying)?: A Lot Help from another person to put on and taking off regular upper body clothing?: A Little Help from another person to put on and taking off regular lower body clothing?: A Lot 6 Click Score: 16   End of Session Equipment Utilized During Treatment: Rolling walker (2 wheels);Oxygen  Nurse Communication: Mobility status;Patient requests pain meds  Activity Tolerance: Patient limited by pain Patient left: in chair;with call bell/phone within reach;with chair alarm set  OT Visit Diagnosis: Unsteadiness on feet (R26.81);Muscle weakness (generalized) (M62.81)                Time: 5784-6962 OT Time Calculation (min): 21 min Charges:  OT General Charges $OT Visit: 1 Visit OT Evaluation $OT Eval Low Complexity: 1 Low OT Treatments $Self Care/Home Management : 8-22 mins  Gordan Latina, M.S. OTR/L  01/22/24, 11:13 AM  ascom [phone]

## 2024-01-23 DIAGNOSIS — R52 Pain, unspecified: Secondary | ICD-10-CM | POA: Diagnosis not present

## 2024-01-23 MED ORDER — PANTOPRAZOLE SODIUM 40 MG PO TBEC
40.0000 mg | DELAYED_RELEASE_TABLET | Freq: Every day | ORAL | Status: DC
  Administered 2024-01-23 – 2024-01-27 (×5): 40 mg via ORAL
  Filled 2024-01-23 (×5): qty 1

## 2024-01-23 NOTE — Progress Notes (Signed)
 Occupational Therapy Treatment Patient Details Name: Wendy Ferrell MRN: [id number] DOB: 1955-10-27 Today's Date: 01/23/2024   History of present illness Awilda Ketterling is a 68 year old female with history of COPD, non insulin-dependent diabetes melitis, hypertension, PVD on Eliquis , COPD, chronic hypoxic respiratory failure requiring baseline 4 L nasal cannula at baseline, who presents emergency department for chief concerns of a fall yesterday. L Hip X-Ray:  left acute minimally displaced parasymphyseal fracture of left pubic body   OT comments  Pt seen for OT treatment on this date. Upon arrival to room pt seated on [facility] with NT in room, agreeable to tx. Pt completed pericare with supervision during lateral leans. Pt STS from [facility] with CGA + RW to offload LE. Pt took backward steps to sit in recliner with CGA+ RW. Pt assisted with calling in lunch order and handoff to PT at the end of session. Pt making good progress toward goals, will continue to follow POC. Discharge recommendation remains appropriate.        If plan is discharge home, recommend the following:  Help with stairs or ramp for entrance;A little help with walking and/or transfers;A little help with bathing/dressing/bathroom   Equipment Recommendations  BSC/3in1    Recommendations for Other Services      Precautions / Restrictions Precautions Precautions: Fall Recall of Precautions/Restrictions: Intact Restrictions Weight Bearing Restrictions Per Provider Order: Yes LLE Weight Bearing Per Provider Order: Weight bearing as tolerated       Mobility Bed Mobility               General bed mobility comments: NT pt recieved on BSC with NT present    Transfers Overall transfer level: Needs assistance Equipment used: Rolling walker (2 wheels) Transfers: Sit to/from Stand, Bed to chair/wheelchair/BSC Sit to Stand: Contact guard assist           General transfer comment: Min cues for handplacement during t/f      Balance Overall balance assessment: Needs assistance Sitting-balance support: Bilateral upper extremity supported, Feet supported Sitting balance-Leahy Scale: Good Sitting balance - Comments: Steady reach within BOS   Standing balance support: Bilateral upper extremity supported Standing balance-Leahy Scale: Fair                             ADL either performed or assessed with clinical judgement   ADL Overall ADL's : Needs assistance/impaired                     Lower Body Dressing: Minimal assistance;Sit to/from stand   Toilet Transfer: Contact guard assist;Rolling walker (2 wheels);BSC/3in1;Cueing for safety   Toileting- Clothing Manipulation and Hygiene: Sitting/lateral lean;Supervision/safety       Functional mobility during ADLs: Contact guard assist;Rolling walker (2 wheels);Cueing for safety General ADL Comments: CGA for standing tasks, supervision for pericare sitting/lateral leans    Communication Communication Communication: No apparent difficulties   Cognition Arousal: Alert Behavior During Therapy: WFL for tasks assessed/performed Cognition: No apparent impairments             OT - Cognition Comments: A/OX4                 Following commands: Intact Following commands impaired: Follows one step commands with increased time      Cueing   Cueing Techniques: Verbal cues, Tactile cues  Exercises Exercises: Other exercises Other Exercises Other Exercises: Edu: benefits of sitting up in recliner vs the bed  Shoulder Instructions       General Comments Pt on 4L oxygen  via [place] on arrival to room in sitting 98%    Pertinent Vitals/ Pain       Pain Assessment Pain Assessment: Faces Faces Pain Scale: Hurts little more Pain Location: L hip Pain Descriptors / Indicators: Grimacing, Guarding Pain Intervention(s): Limited activity within patient's tolerance, Monitored during session  Home Living                                           Prior Functioning/Environment              Frequency  Min 2X/week        Progress Toward Goals  OT Goals(current goals can now be found in the care plan section)  Progress towards OT goals: Progressing toward goals  Acute Rehab OT Goals Patient Stated Goal: to go home OT Goal Formulation: With patient Time For Goal Achievement: 02/05/24 Potential to Achieve Goals: Good ADL Goals Pt Will Perform Grooming: with modified independence;sitting Pt Will Perform Lower Body Dressing: with modified independence;sit to/from stand Pt Will Transfer to Toilet: with modified independence;ambulating;regular height toilet  Plan      Co-evaluation                 AM-PAC OT "6 Clicks" Daily Activity     Outcome Measure   Help from another person eating meals?: None Help from another person taking care of personal grooming?: A Little Help from another person toileting, which includes using toliet, bedpan, or urinal?: A Little Help from another person bathing (including washing, rinsing, drying)?: A Little Help from another person to put on and taking off regular upper body clothing?: None Help from another person to put on and taking off regular lower body clothing?: A Little 6 Click Score: 20    End of Session Equipment Utilized During Treatment: Rolling walker (2 wheels);Oxygen   OT Visit Diagnosis: Unsteadiness on feet (R26.81);Muscle weakness (generalized) (M62.81)   Activity Tolerance Patient tolerated treatment well   Patient Left in chair;with call bell/phone within reach;with chair alarm set   Nurse Communication Mobility status;Patient requests pain meds        Time: 1106-1130 OT Time Calculation (min): 24 min  Charges: OT General Charges $OT Visit: 1 Visit OT Treatments $Self Care/Home Management : 8-22 mins  Rosaria Common M.S. OTR/L  01/23/24, 12:13 PM

## 2024-01-23 NOTE — Progress Notes (Signed)
 PROGRESS NOTE    Wendy Ferrell  BJY:[id number] DOB: Aug 12, 1956 DOA: 01/21/2024 PCP: Shari Daughters, MD    Brief Narrative:  68 year old female with history of COPD, non insulin-dependent diabetes melitis, hypertension, PVD on Eliquis , COPD, chronic hypoxic respiratory failure requiring baseline 4 L nasal cannula at baseline, who presents emergency department for chief concerns of a fall yesterday.   Non displaced pelvic fracture noted on imaging.  No surgical intervention warranted.  Weightbearing as tolerated.  Therapy evaluations requested.  Pain control.   Assessment & Plan:   Principal Problem:   Inadequate pain control Active Problems:   Hyperlipidemia   PAD (peripheral artery disease) (HCC)   COPD (chronic obstructive pulmonary disease) (HCC)   Nicotine  dependence, uncomplicated   Essential hypertension   Diabetes (HCC)  * Inadequate pain control Acute pelvic fracture No surgical indications.  Patient having difficulty with mobility efforts Slowly improving, currently unsafe to dc home Plan: Continue with multimodal pain control Continue therapy evaluations  Hopeful to discharge 5/16  Paroxysmal atrial fibrillation with rapid ventricular response Rapid rate likely influenced by inadequate pain control Improving Plan: Continue metoprolol  12.5 mg twice daily Continue Eliquis  5 mg twice daily   Essential hypertension Continue metoprolol    Nicotine  dependence, uncomplicated Continue as needed nicotine  patch   COPD (chronic obstructive pulmonary disease) (HCC) Continue nasal cannula 4 L, currently at baseline Does not appear to be in acute exacerbation Home long-acting inhaler equivalent resumed on admission As needed nebs   PAD (peripheral artery disease) (HCC) Continue apixaban  and Plavix    Hyperlipidemia Continue statin   DVT prophylaxis: Eliquis  Code Status: Full Family Communication: Significant other at bedside 5/14 Disposition Plan: Status  is: Observation The patient will require care spanning > 2 midnights and should be moved to inpatient because: Inadequate pain control.  Hopeful discharge in 24 hours   Level of care: Telemetry Medical  Consultants:  None  Procedures:  None  Antimicrobials: None   Subjective: Seen and examined.  Resting in bed.  Motivated with mobility efforts but unsafe to discharge home at this time  Objective: Vitals:   01/22/24 0805 01/22/24 1524 01/22/24 2218 01/23/24 0752  BP: (!) 115/55 (!) 99/41 (!) 100/56 127/81  Pulse: 80 76  74  Resp: 18 16  20   Temp: 98.4 F (36.9 C) 98.6 F (37 C)  97.8 F (36.6 C)  TempSrc:      SpO2: 100% 99%  98%  Weight:      Height:        Intake/Output Summary (Last 24 hours) at 01/23/2024 1021 Last data filed at 01/22/2024 1500 Gross per 24 hour  Intake 0 ml  Output --  Net 0 ml   Filed Weights   01/21/24 1624  Weight: 48.5 kg    Examination:  General exam: NAD.  Frail-appearing Respiratory system: Decreased breath sounds bilaterally.  Bibasilar crackles.  Normal work of breathing.  4 L Cardiovascular system: S1 and 2, regular rate, irregular rhythm, no murmurs Gastrointestinal system: Thin, soft, NT/ND, normal bowel sounds Central nervous system: Alert and oriented. No focal neurological deficits. Extremities: Symmetric decreased power bilateral lower extremities.  Gait not assessed Skin: No rashes, lesions or ulcers Psychiatry: Judgement and insight appear normal. Mood & affect appropriate.     Data Reviewed: I have personally reviewed following labs and imaging studies  CBC: Recent Labs  Lab 01/21/24 1830 01/22/24 0224  WBC 13.7* 7.2  NEUTROABS 11.8*  --   HGB 12.7 11.5*  HCT 37.0 33.0*  MCV 93.4 92.7  PLT 331 289   Basic Metabolic Panel: Recent Labs  Lab 01/21/24 1830 01/22/24 0224  NA 130* 131*  K 3.7 3.4*  CL 80* 85*  CO2 38* 39*  GLUCOSE 117* 110*  BUN 12 16  CREATININE 0.64 0.66  CALCIUM  9.0 8.2*    GFR: Estimated Creatinine Clearance: 53 mL/min (by C-G formula based on SCr of 0.66 mg/dL). Liver Function Tests: No results for input(s): "AST", "ALT", "ALKPHOS", "BILITOT", "PROT", "ALBUMIN" in the last 168 hours. No results for input(s): "LIPASE", "AMYLASE" in the last 168 hours. No results for input(s): "AMMONIA" in the last 168 hours. Coagulation Profile: No results for input(s): "INR", "PROTIME" in the last 168 hours. Cardiac Enzymes: No results for input(s): "CKTOTAL", "CKMB", "CKMBINDEX", "TROPONINI" in the last 168 hours. BNP (last 3 results) No results for input(s): "PROBNP" in the last 8760 hours. HbA1C: No results for input(s): "HGBA1C" in the last 72 hours. CBG: No results for input(s): "GLUCAP" in the last 168 hours. Lipid Profile: No results for input(s): "CHOL", "HDL", "LDLCALC", "TRIG", "CHOLHDL", "LDLDIRECT" in the last 72 hours. Thyroid  Function Tests: No results for input(s): "TSH", "T4TOTAL", "FREET4", "T3FREE", "THYROIDAB" in the last 72 hours. Anemia Panel: No results for input(s): "VITAMINB12", "FOLATE", "FERRITIN", "TIBC", "IRON", "RETICCTPCT" in the last 72 hours. Sepsis Labs: No results for input(s): "PROCALCITON", "LATICACIDVEN" in the last 168 hours.  No results found for this or any previous visit (from the past 240 hours).       Radiology Studies: CT Hip Left Wo Contrast Result Date: 01/21/2024 CLINICAL DATA:  Hip trauma, fracture suspected, xray done EXAM: CT OF THE LEFT HIP WITHOUT CONTRAST TECHNIQUE: Multidetector CT imaging of the left hip was performed according to the standard protocol. Multiplanar CT image reconstructions were also generated. RADIATION DOSE REDUCTION: This exam was performed according to the departmental dose-optimization program which includes automated exposure control, adjustment of the mA and/or kV according to patient size and/or use of iterative reconstruction technique. COMPARISON:  Same day radiographs of the left hip  dated 01/21/2024 at 4:54 p.m. FINDINGS: Bones/Joint/Cartilage Acute minimally displaced parasymphyseal fracture of the left pubic body. Pubic symphysis appears anatomically aligned. The remainder of the visualized bones appear intact. The left femoral head is seated within the acetabula with mild degenerative changes of the left hip. Ligaments Suboptimally assessed by CT. Muscles and Tendons Musculature is within normal limits.  No acute abnormality. Soft tissues No fluid collection or hematoma. Peripheral atherosclerotic vascular calcifications are noted. IMPRESSION: Acute minimally displaced parasymphyseal fracture of the left pubic body. Pubic symphysis appears anatomically aligned. Electronically Signed   By: Mannie Seek M.D.   On: 01/21/2024 18:04   DG Hip Unilat W or Wo Pelvis 2-3 Views Left Result Date: 01/21/2024 CLINICAL DATA:  Left hip pain following a fall yesterday. EXAM: DG HIP (WITH OR WITHOUT PELVIS) 2-3V LEFT COMPARISON:  None Available. FINDINGS: Minimal bilateral hip degenerative changes. No fracture or dislocation seen. Bilateral common iliac artery stents and atheromatous calcifications. Lower lumbar spine degenerative changes. IMPRESSION: 1. No fracture. 2. Minimal bilateral hip degenerative changes. Electronically Signed   By: Catherin Closs M.D.   On: 01/21/2024 17:09        Scheduled Meds:  acetaminophen   1,000 mg Oral TID   apixaban   5 mg Oral BID   atorvastatin   40 mg Oral QHS   budesonide -glycopyrrolate-formoterol   2 puff Inhalation BID   clopidogrel   75 mg Oral Daily   docusate sodium   100 mg Oral  BID   ketorolac  15 mg Intravenous Q6H   losartan   25 mg Oral Daily   metoprolol  tartrate  12.5 mg Oral BID   Continuous Infusions:   LOS: 0 days      Tiajuana Fluke, MD [facility]   If 7PM-7AM, please contact night-coverage  01/23/2024, 10:21 AM

## 2024-01-23 NOTE — Progress Notes (Signed)
 Physical Therapy Treatment Patient Details Name: Wendy Ferrell MRN: [id number] DOB: 23-Dec-1955 Today's Date: 01/23/2024   History of Present Illness Cyntia Besson is a 68 year old female with history of COPD, non insulin-dependent diabetes melitis, hypertension, PVD on Eliquis , COPD, chronic hypoxic respiratory failure requiring baseline 4 L nasal cannula at baseline, who presents emergency department for chief concerns of a fall yesterday. L Hip X-Tirsa Gail:  left acute minimally displaced parasymphyseal fracture of left pubic body    PT Comments  Pt is making good progress towards goals. Pre medicated prior to session. Pt received in chair with OT at bedside. Pt reports her pain is more controlled compared to previous date. Able to perform transfers and minimal ambulation in room with supervision, limited by endurance and SOB symptoms. Pt on baseline 4L of O2. Due to progress, secure chat sent to care team with updated dispo. Will need to perform stair training next session. Will continue to progress.    If plan is discharge home, recommend the following: A little help with bathing/dressing/bathroom;A lot of help with walking and/or transfers;Assist for transportation;Help with stairs or ramp for entrance   Can travel by private vehicle        Equipment Recommendations  Rolling walker (2 wheels);BSC/3in1    Recommendations for Other Services       Precautions / Restrictions Precautions Precautions: Fall Recall of Precautions/Restrictions: Intact Restrictions Weight Bearing Restrictions Per Provider Order: Yes LLE Weight Bearing Per Provider Order: Weight bearing as tolerated     Mobility  Bed Mobility               General bed mobility comments: received in recliner at beginning of session    Transfers Overall transfer level: Needs assistance Equipment used: Rolling walker (2 wheels) Transfers: Sit to/from Stand Sit to Stand: Supervision           General transfer  comment: one cue for sequencing/hand placement. Pt able to stand with ease. All mobility performed on 4L of O2. HR WNL    Ambulation/Gait Ambulation/Gait assistance: Supervision Gait Distance (Feet): 10 Feet Assistive device: Rolling walker (2 wheels) Gait Pattern/deviations: Step-to pattern       General Gait Details: improved ability to tolerate WBing on L LE this date. Several minutes spent on pre gait activities including using B UE support on RW. Pt then able to take shuffle gait in forward direction, turn and return to chair. Pt mainly limited by SOB symptoms, O2 at 94% on 4L Antalgic gait   Stairs             Wheelchair Mobility     Tilt Bed    Modified Rankin (Stroke Patients Only)       Balance Overall balance assessment: Needs assistance Sitting-balance support: Bilateral upper extremity supported, Feet supported Sitting balance-Leahy Scale: Good     Standing balance support: Bilateral upper extremity supported Standing balance-Leahy Scale: Fair                              Hotel manager: No apparent difficulties  Cognition Arousal: Alert Behavior During Therapy: WFL for tasks assessed/performed   PT - Cognitive impairments: No apparent impairments                       PT - Cognition Comments: no anxiety noted except when pt gets SOB. Very pleasant and willing to participate Following commands: Intact  Cueing Cueing Techniques: Verbal cues, Tactile cues  Exercises Other Exercises Other Exercises: seated ther-ex performed on B LE including LAQ, hip add squeezes, and B UE scap squeezes for chest expansion. 10 reps with supervision    General Comments        Pertinent Vitals/Pain Pain Assessment Pain Assessment: Faces Faces Pain Scale: Hurts little more Pain Location: L hip Pain Descriptors / Indicators: Grimacing, Guarding Pain Intervention(s): Limited activity within patient's  tolerance, Premedicated before session, Repositioned    Home Living                          Prior Function            PT Goals (current goals can now be found in the care plan section) Acute Rehab PT Goals Patient Stated Goal: to go home PT Goal Formulation: With patient Time For Goal Achievement: 02/05/24 Potential to Achieve Goals: Good Progress towards PT goals: Progressing toward goals    Frequency    7X/week      PT Plan      Co-evaluation              AM-PAC PT "6 Clicks" Mobility   Outcome Measure  Help needed turning from your back to your side while in a flat bed without using bedrails?: A Little Help needed moving from lying on your back to sitting on the side of a flat bed without using bedrails?: A Little Help needed moving to and from a bed to a chair (including a wheelchair)?: A Little Help needed standing up from a chair using your arms (e.g., wheelchair or bedside chair)?: A Little Help needed to walk in hospital room?: A Lot Help needed climbing 3-5 steps with a railing? : A Lot 6 Click Score: 16    End of Session Equipment Utilized During Treatment: Gait belt;Oxygen  Activity Tolerance: Patient limited by pain Patient left: in chair;with chair alarm set;with call bell/phone within reach Nurse Communication: Mobility status PT Visit Diagnosis: Unsteadiness on feet (R26.81);Muscle weakness (generalized) (M62.81);History of falling (Z91.81);Difficulty in walking, not elsewhere classified (R26.2);Pain Pain - Right/Left: Left Pain - part of body: Hip     Time: 1308-6578 PT Time Calculation (min) (ACUTE ONLY): 22 min  Charges:    $Therapeutic Exercise: 8-22 mins PT General Charges $$ ACUTE PT VISIT: 1 Visit                     Amparo Balk, PT, DPT, GCS [phone]    Raechell Singleton 01/23/2024, 11:54 AM

## 2024-01-23 NOTE — Plan of Care (Signed)
   Problem: Clinical Measurements: Goal: Respiratory complications will improve Outcome: Progressing

## 2024-01-23 NOTE — Plan of Care (Signed)

## 2024-01-24 ENCOUNTER — Observation Stay

## 2024-01-24 DIAGNOSIS — R0602 Shortness of breath: Secondary | ICD-10-CM | POA: Diagnosis not present

## 2024-01-24 DIAGNOSIS — I700 Atherosclerosis of aorta: Secondary | ICD-10-CM | POA: Diagnosis not present

## 2024-01-24 DIAGNOSIS — R52 Pain, unspecified: Secondary | ICD-10-CM | POA: Diagnosis not present

## 2024-01-24 DIAGNOSIS — R918 Other nonspecific abnormal finding of lung field: Secondary | ICD-10-CM | POA: Diagnosis not present

## 2024-01-24 MED ORDER — GUAIFENESIN ER 600 MG PO TB12
600.0000 mg | ORAL_TABLET | Freq: Two times a day (BID) | ORAL | Status: DC
  Administered 2024-01-24 – 2024-01-27 (×7): 600 mg via ORAL
  Filled 2024-01-24 (×7): qty 1

## 2024-01-24 MED ORDER — IPRATROPIUM-ALBUTEROL 0.5-2.5 (3) MG/3ML IN SOLN
3.0000 mL | Freq: Four times a day (QID) | RESPIRATORY_TRACT | Status: DC
  Administered 2024-01-24 – 2024-01-25 (×4): 3 mL via RESPIRATORY_TRACT
  Filled 2024-01-24 (×3): qty 3

## 2024-01-24 MED ORDER — METHYLPREDNISOLONE SODIUM SUCC 40 MG IJ SOLR
40.0000 mg | Freq: Two times a day (BID) | INTRAMUSCULAR | Status: DC
  Administered 2024-01-24 (×2): 40 mg via INTRAVENOUS
  Filled 2024-01-24 (×3): qty 1

## 2024-01-24 MED ORDER — ARFORMOTEROL TARTRATE 15 MCG/2ML IN NEBU
15.0000 ug | INHALATION_SOLUTION | Freq: Two times a day (BID) | RESPIRATORY_TRACT | Status: DC
Start: 2024-01-24 — End: 2024-01-25
  Administered 2024-01-24 – 2024-01-25 (×3): 15 ug via RESPIRATORY_TRACT
  Filled 2024-01-24 (×3): qty 2

## 2024-01-24 NOTE — Plan of Care (Signed)
  Problem: Education: Goal: Knowledge of General Education information will improve Description: Including pain rating scale, medication(s)/side effects and non-pharmacologic comfort measures Outcome: Progressing   Problem: Health Behavior/Discharge Planning: Goal: Ability to manage health-related needs will improve Outcome: Progressing   Problem: Clinical Measurements: Goal: Ability to maintain clinical measurements within normal limits will improve 01/24/2024 0141 by [address] Distance, RN Outcome: Progressing 01/24/2024 0141 by [address] Distance, RN Outcome: Progressing Goal: Will remain free from infection 01/24/2024 0141 by [address] Distance, RN Outcome: Progressing 01/24/2024 0141 by [address] Distance, RN Outcome: Progressing Goal: Diagnostic test results will improve 01/24/2024 0141 by [address] Distance, RN Outcome: Progressing 01/24/2024 0141 by [address] Distance, RN Outcome: Progressing Goal: Respiratory complications will improve 01/24/2024 0141 by [address] Distance, RN Outcome: Progressing 01/24/2024 0141 by [address] Distance, RN Outcome: Progressing Goal: Cardiovascular complication will be avoided 01/24/2024 0141 by [address] Distance, RN Outcome: Progressing 01/24/2024 0141 by Court Distance, RN Outcome: Progressing   Problem: Activity: Goal: Risk for activity intolerance will decrease 01/24/2024 0141 by [address] Distance, RN Outcome: Progressing 01/24/2024 0141 by [address] Distance, RN Outcome: Progressing   Problem: Nutrition: Goal: Adequate nutrition will be maintained 01/24/2024 0141 by [address] Distance, RN Outcome: Progressing 01/24/2024 0141 by Court Distance, RN Outcome: Progressing   Problem: Coping: Goal: Level of anxiety will decrease 01/24/2024 0141 by [address] Distance, RN Outcome: Progressing 01/24/2024 0141 by [address] Distance, RN Outcome: Progressing   Problem: Elimination: Goal: Will not experience complications related to bowel motility 01/24/2024 0141  by Court Distance, RN Outcome: Progressing 01/24/2024 0141 by Court Distance, RN Outcome: Progressing Goal: Will not experience complications related to urinary retention 01/24/2024 0141 by Court Distance, RN Outcome: Progressing 01/24/2024 0141 by Court Distance, RN Outcome: Progressing   Problem: Pain Managment: Goal: General experience of comfort will improve and/or be controlled 01/24/2024 0141 by Court Distance, RN Outcome: Progressing 01/24/2024 0141 by Court Distance, RN Outcome: Progressing   Problem: Safety: Goal: Ability to remain free from injury will improve 01/24/2024 0141 by Court Distance, RN Outcome: Progressing 01/24/2024 0141 by Court Distance, RN Outcome: Progressing   Problem: Skin Integrity: Goal: Risk for impaired skin integrity will decrease 01/24/2024 0141 by Court Distance, RN Outcome: Progressing 01/24/2024 0141 by Court Distance, RN Outcome: Progressing

## 2024-01-24 NOTE — TOC Progression Note (Signed)
 Transition of Care [facility]) - Progression Note    Patient Details  Name: Wendy Ferrell MRN: [id number] Date of Birth: 07-12-1956  Transition of Care [facility]) CM/SW Contact  Alexandra Ice, RN Phone Number: 01/24/2024, 3:32 PM  Clinical Narrative:     Patient had increased oxygen  demand, will continue to monitor for additional TOC needs.   Expected Discharge Plan: Home w Home Health Services Barriers to Discharge: Continued Medical Work up  Expected Discharge Plan and Services   Discharge Planning Services: CM Consult Post Acute Care Choice: Home Health, Durable Medical Equipment Living arrangements for the past 2 months: Mobile Home                 DME Arranged: Walker rolling, Bedside commode DME Agency: [employer] Date DME Agency Contacted: 01/22/24 Time DME Agency Contacted: [phone] Representative spoke with at DME Agency: Sam Creighton HH Arranged: PT, OT HH Agency: [facility] Date HH Agency Contacted: 01/22/24 Time HH Agency Contacted: 1437 Representative spoke with at [facility] Agency: Shaun   Social Determinants of Health (SDOH) Interventions SDOH Screenings   Food Insecurity: No Food Insecurity (01/22/2024)  Housing: Low Risk  (01/22/2024)  Transportation Needs: No Transportation Needs (01/22/2024)  Utilities: Not At Risk (01/22/2024)  Depression (PHQ2-9): Low Risk  (07/04/2023)  Social Connections: Moderately Integrated (01/22/2024)  Tobacco Use: Medium Risk (01/21/2024)    Readmission Risk Interventions     No data to display

## 2024-01-24 NOTE — Progress Notes (Signed)
 PROGRESS NOTE    Wendy Ferrell  BJY:[id number] DOB: 13-Apr-1956 DOA: 01/21/2024 PCP: Shari Daughters, MD    Brief Narrative:  68 year old female with history of COPD, non insulin-dependent diabetes melitis, hypertension, PVD on Eliquis , COPD, chronic hypoxic respiratory failure requiring baseline 4 L nasal cannula at baseline, who presents emergency department for chief concerns of a fall yesterday.   Non displaced pelvic fracture noted on imaging.  No surgical intervention warranted.  Weightbearing as tolerated.  Therapy evaluations requested.  Pain control.  5/16: Patient had worsening of respiratory status overnight.  Increased shortness of breath.  Cough.  Increasing oxygen  demand 5 L.   Assessment & Plan:   Principal Problem:   Inadequate pain control Active Problems:   Hyperlipidemia   PAD (peripheral artery disease) (HCC)   COPD (chronic obstructive pulmonary disease) (HCC)   Nicotine  dependence, uncomplicated   Essential hypertension   Diabetes (HCC)  Acute exacerbation of COPD Acute on chronic hypoxic respiratory failure Patient had a decline in her respiratory status on 5/16.  Increased oxygen  demand to 5 L associated with cough and shortness of breath. Breath sounds diminished bilaterally with mild end expiratory wheeze Plan: IV Solu-Medrol  40 mg twice daily Scheduled nebs every 6 hours Scheduled Brovana every 12 hours Wean oxygen  as tolerated, goal rate 4 L   Inadequate pain control Acute pelvic fracture No surgical indications.  Patient having difficulty with mobility efforts Slowly improving, currently unsafe to dc home Plan: Continue with current multimodal pain control.  Continue therapy evaluations as tolerated.  Hopeful to discharge 5/17 Home with home health.  Paroxysmal atrial fibrillation with rapid ventricular response Rapid rate likely influenced by inadequate pain control Improving Plan: Continue metoprolol  12.5 mg twice daily.  No room to  escalate.  Continue Eliquis  5 mg twice daily   Essential hypertension Continue metoprolol  as above   Nicotine  dependence, uncomplicated Continue as needed nicotine  patch.  Advised smoking cessation    PAD (peripheral artery disease) (HCC) Continue apixaban  and Plavix    Hyperlipidemia Continue statin   DVT prophylaxis: Eliquis  Code Status: Full Family Communication: Significant other at bedside 5/14 Disposition Plan: Status is: Observation The patient will require care spanning > 2 midnights and should be moved to inpatient because: Acute on chronic hypoxic respiratory failure in the setting of acute decompensated COPD on IV steroids.   Level of care: Telemetry Medical  Consultants:  None  Procedures:  None  Antimicrobials: None   Subjective: Seen and examined.  Sitting up in bed.  Feels poorly today.  Increasing shortness of breath associated with cough.  Objective: Vitals:   01/23/24 2006 01/24/24 0421 01/24/24 0723 01/24/24 0823  BP: (!) 127/56 (!) 108/59 137/67 134/75  Pulse: 98 70 100 74  Resp: 15 18 16  (!) 21  Temp: 98.6 F (37 C) 97.7 F (36.5 C) 98 F (36.7 C)   TempSrc: Oral Oral    SpO2: 100% 99% 99% 99%  Weight:      Height:        Intake/Output Summary (Last 24 hours) at 01/24/2024 1204 Last data filed at 01/23/2024 1421 Gross per 24 hour  Intake 240 ml  Output --  Net 240 ml   Filed Weights   01/21/24 1624  Weight: 48.5 kg    Examination:  General exam: Appears frail and chronically ill Respiratory system: Diminished air entry.  Scattered wheeze.  Scattered crackles.  5 L Cardiovascular system: S1 and 2, regular rate, irregular rhythm, no murmurs Gastrointestinal system: Thin, soft,  NT/ND, normal bowel sounds Central nervous system: Alert and oriented. No focal neurological deficits. Extremities: Symmetric decreased power bilateral lower extremities.  Gait not assessed Skin: No rashes, lesions or ulcers Psychiatry: Judgement and  insight appear normal. Mood & affect depressed.     Data Reviewed: I have personally reviewed following labs and imaging studies  CBC: Recent Labs  Lab 01/21/24 1830 01/22/24 0224  WBC 13.7* 7.2  NEUTROABS 11.8*  --   HGB 12.7 11.5*  HCT 37.0 33.0*  MCV 93.4 92.7  PLT 331 289   Basic Metabolic Panel: Recent Labs  Lab 01/21/24 1830 01/22/24 0224  NA 130* 131*  K 3.7 3.4*  CL 80* 85*  CO2 38* 39*  GLUCOSE 117* 110*  BUN 12 16  CREATININE 0.64 0.66  CALCIUM  9.0 8.2*   GFR: Estimated Creatinine Clearance: 53 mL/min (by C-G formula based on SCr of 0.66 mg/dL). Liver Function Tests: No results for input(s): "AST", "ALT", "ALKPHOS", "BILITOT", "PROT", "ALBUMIN" in the last 168 hours. No results for input(s): "LIPASE", "AMYLASE" in the last 168 hours. No results for input(s): "AMMONIA" in the last 168 hours. Coagulation Profile: No results for input(s): "INR", "PROTIME" in the last 168 hours. Cardiac Enzymes: No results for input(s): "CKTOTAL", "CKMB", "CKMBINDEX", "TROPONINI" in the last 168 hours. BNP (last 3 results) No results for input(s): "PROBNP" in the last 8760 hours. HbA1C: No results for input(s): "HGBA1C" in the last 72 hours. CBG: No results for input(s): "GLUCAP" in the last 168 hours. Lipid Profile: No results for input(s): "CHOL", "HDL", "LDLCALC", "TRIG", "CHOLHDL", "LDLDIRECT" in the last 72 hours. Thyroid  Function Tests: No results for input(s): "TSH", "T4TOTAL", "FREET4", "T3FREE", "THYROIDAB" in the last 72 hours. Anemia Panel: No results for input(s): "VITAMINB12", "FOLATE", "FERRITIN", "TIBC", "IRON", "RETICCTPCT" in the last 72 hours. Sepsis Labs: No results for input(s): "PROCALCITON", "LATICACIDVEN" in the last 168 hours.  No results found for this or any previous visit (from the past 240 hours).       Radiology Studies: DG Chest Port 1 View Result Date: 01/24/2024 CLINICAL DATA:  Shortness of breath EXAM: PORTABLE CHEST 1 VIEW  COMPARISON:  X-ray 03/30/2020.  CT 03/31/2020 FINDINGS: Hyperinflation with chronic lung changes. Blunting of the costophrenic angles could be tiny effusion versus pleural thickening. Slight lung base opacities. No pneumothorax or edema. Question left midlung nodule perihilar. Different appearance than prior x-ray normal cardiopericardial silhouette. Calcified aorta. Osteopenia and degenerative changes IMPRESSION: Hyperinflation with chronic changes. Question tiny pleural effusion versus pleural thickening and mild lung base opacities. Atelectasis versus subtle infiltrate. There is a small nodular focus just lateral to left lung hilum which is different appearance on the old x-ray. A subtle lesion is not excluded. Chest recommended when appropriate Electronically Signed   By: Adrianna Horde M.D.   On: 01/24/2024 11:17        Scheduled Meds:  acetaminophen   1,000 mg Oral TID   apixaban   5 mg Oral BID   arformoterol  15 mcg Nebulization BID   atorvastatin   40 mg Oral QHS   clopidogrel   75 mg Oral Daily   docusate sodium   100 mg Oral BID   guaiFENesin   600 mg Oral BID   ipratropium-albuterol   3 mL Nebulization Q6H   losartan   25 mg Oral Daily   methylPREDNISolone  (SOLU-MEDROL ) injection  40 mg Intravenous Q12H   metoprolol  tartrate  12.5 mg Oral BID   pantoprazole  40 mg Oral Daily   Continuous Infusions:   LOS: 0 days  Tiajuana Fluke, MD [facility]   If 7PM-7AM, please contact night-coverage  01/24/2024, 12:04 PM

## 2024-01-24 NOTE — Plan of Care (Signed)

## 2024-01-24 NOTE — Progress Notes (Signed)
 Physical Therapy Treatment Patient Details Name: Wendy Ferrell MRN: [id number] DOB: [birth year] Today's Date: 01/24/2024   History of Present Illness Wendy Ferrell is a 68 year old female with history of COPD, non insulin-dependent diabetes melitis, hypertension, PVD on Eliquis , COPD, chronic hypoxic respiratory failure requiring baseline 4 L nasal cannula at baseline, who presents emergency department for chief concerns of a fall yesterday. L Hip X-Ray:  left acute minimally displaced parasymphyseal fracture of left pubic body    PT Comments   Multiple attempts to treat pt from early morning to mid afternoon. Pt reports feeling SOB dypsnea 5/5  (on 4L O2 SPO2 96%, HR 87) and wanting to try later when she can catch her breath. Pt finally willing to try to work with PTA at 1400 starting with supine LE strengthening exercises. Pt started ther ex then had urgent need to urinate. Performed functional mobility tasks (bed mobility, transfer, functional standing balance) at supervision level with intermittent UE support not reporting pain but severe SOB.  Continued PT will assist pt towards greater dynamic standing balance, LE strengthening, and activity tolerance to increase safety and independence and decrease burden of care with functional mobility.      If plan is discharge home, recommend the following: A little help with bathing/dressing/bathroom;A lot of help with walking and/or transfers;Assist for transportation;Help with stairs or ramp for entrance   Can travel by private vehicle        Equipment Recommendations  Rolling walker (2 wheels);BSC/3in1    Recommendations for Other Services       Precautions / Restrictions Precautions Recall of Precautions/Restrictions: Intact Restrictions Weight Bearing Restrictions Per Provider Order: Yes LLE Weight Bearing Per Provider Order: Weight bearing as tolerated     Mobility  Bed Mobility Overal bed mobility: Needs Assistance Bed Mobility: Sit  to Supine     Supine to sit: Supervision Sit to supine: Supervision        Transfers Overall transfer level: Needs assistance Equipment used: Rolling walker (2 wheels) Transfers: Sit to/from Stand, Bed to chair/wheelchair/BSC Sit to Stand: Supervision Stand pivot transfers: Supervision Step pivot transfers: Contact guard assist       General transfer comment: Min cues for handplacement during t/f    Ambulation/Gait Ambulation/Gait assistance: Supervision Gait Distance (Feet): 3 Feet (just to transfers bed <>commode 2/2 SOB.) Assistive device: Rolling walker (2 wheels) Gait Pattern/deviations: Step-to pattern           Stairs             Wheelchair Mobility     Tilt Bed    Modified Rankin (Stroke Patients Only)       Balance Overall balance assessment: Needs assistance Sitting-balance support: Feet supported, No upper extremity supported   Sitting balance - Comments: Steady dynamic balance   Standing balance support: Single extremity supported, During functional activity, Reliant on assistive device for balance Standing balance-Leahy Scale: Good Standing balance comment: supervision, dynamic balance with RW                            Communication Communication Communication: No apparent difficulties  Cognition Arousal: Alert Behavior During Therapy: Anxious   PT - Cognitive impairments: No apparent impairments                       PT - Cognition Comments: anxiety noted when pt gets SOB. Multiple attempts to treat pt from early morning to mid afternoon.  Pt  reports feeling SOB 5/5 and wanting to try later when she can catch her breath.   Pt finally willing to try to work with PT at 1400 starting with supine LE strengthening exercises.  Pt started ther ex  then  had urgent need to urninate.  Performed functional mobility tasks at supervision level with intermittent UE support. Following commands: Intact Following commands  impaired: Follows one step commands with increased time    Cueing Cueing Techniques: Verbal cues, Tactile cues  Exercises Total Joint Exercises Heel Slides: Strengthening, Left, 10 reps, AROM    General Comments        Pertinent Vitals/Pain Pain Assessment Pain Assessment: Faces Pain Score: 2  Faces Pain Scale: Hurts a little bit Pain Location: L hip Pain Descriptors / Indicators: Grimacing    Home Living                          Prior Function            PT Goals (current goals can now be found in the care plan section) Acute Rehab PT Goals Patient Stated Goal: to go home PT Goal Formulation: With patient Time For Goal Achievement: 02/05/24 Potential to Achieve Goals: Good Progress towards PT goals: Progressing toward goals    Frequency    7X/week      PT Plan      Co-evaluation              AM-PAC PT "6 Clicks" Mobility   Outcome Measure  Help needed turning from your back to your side while in a flat bed without using bedrails?: A Little Help needed moving from lying on your back to sitting on the side of a flat bed without using bedrails?: A Little Help needed moving to and from a bed to a chair (including a wheelchair)?: A Little Help needed standing up from a chair using your arms (e.g., wheelchair or bedside chair)?: A Little Help needed to walk in hospital room?: A Lot Help needed climbing 3-5 steps with a railing? : A Lot 6 Click Score: 16    End of Session Equipment Utilized During Treatment: Oxygen  Activity Tolerance: Other (comment) (SOB 5/5 throughout session) Patient left: in bed;with bed alarm set;with call bell/phone within reach Nurse Communication: Mobility status PT Visit Diagnosis: Unsteadiness on feet (R26.81);Muscle weakness (generalized) (M62.81);History of falling (Z91.81);Difficulty in walking, not elsewhere classified (R26.2);Pain Pain - Right/Left: Left Pain - part of body: Hip     Time: 9528-4132 PT Time  Calculation (min) (ACUTE ONLY): 12 min  Charges:    $Therapeutic Activity: 8-22 mins PT General Charges $$ ACUTE PT VISIT: 1 Visit                    Eliazar Gross, PTA  01/24/24, 2:31 PM

## 2024-01-25 DIAGNOSIS — I10 Essential (primary) hypertension: Secondary | ICD-10-CM | POA: Diagnosis present

## 2024-01-25 DIAGNOSIS — F419 Anxiety disorder, unspecified: Secondary | ICD-10-CM | POA: Diagnosis present

## 2024-01-25 DIAGNOSIS — Z66 Do not resuscitate: Secondary | ICD-10-CM | POA: Diagnosis present

## 2024-01-25 DIAGNOSIS — Z681 Body mass index (BMI) 19 or less, adult: Secondary | ICD-10-CM | POA: Diagnosis not present

## 2024-01-25 DIAGNOSIS — Z7901 Long term (current) use of anticoagulants: Secondary | ICD-10-CM | POA: Diagnosis not present

## 2024-01-25 DIAGNOSIS — E1151 Type 2 diabetes mellitus with diabetic peripheral angiopathy without gangrene: Secondary | ICD-10-CM | POA: Diagnosis present

## 2024-01-25 DIAGNOSIS — E785 Hyperlipidemia, unspecified: Secondary | ICD-10-CM | POA: Diagnosis present

## 2024-01-25 DIAGNOSIS — J441 Chronic obstructive pulmonary disease with (acute) exacerbation: Secondary | ICD-10-CM | POA: Diagnosis not present

## 2024-01-25 DIAGNOSIS — S32502A Unspecified fracture of left pubis, initial encounter for closed fracture: Secondary | ICD-10-CM | POA: Diagnosis present

## 2024-01-25 DIAGNOSIS — W010XXA Fall on same level from slipping, tripping and stumbling without subsequent striking against object, initial encounter: Secondary | ICD-10-CM | POA: Diagnosis present

## 2024-01-25 DIAGNOSIS — R54 Age-related physical debility: Secondary | ICD-10-CM | POA: Diagnosis present

## 2024-01-25 DIAGNOSIS — J439 Emphysema, unspecified: Secondary | ICD-10-CM | POA: Diagnosis present

## 2024-01-25 DIAGNOSIS — Z7409 Other reduced mobility: Secondary | ICD-10-CM | POA: Diagnosis present

## 2024-01-25 DIAGNOSIS — J9621 Acute and chronic respiratory failure with hypoxia: Secondary | ICD-10-CM | POA: Diagnosis not present

## 2024-01-25 DIAGNOSIS — Z803 Family history of malignant neoplasm of breast: Secondary | ICD-10-CM | POA: Diagnosis not present

## 2024-01-25 DIAGNOSIS — Z825 Family history of asthma and other chronic lower respiratory diseases: Secondary | ICD-10-CM | POA: Diagnosis not present

## 2024-01-25 DIAGNOSIS — Z716 Tobacco abuse counseling: Secondary | ICD-10-CM | POA: Diagnosis not present

## 2024-01-25 DIAGNOSIS — F17200 Nicotine dependence, unspecified, uncomplicated: Secondary | ICD-10-CM | POA: Diagnosis present

## 2024-01-25 DIAGNOSIS — I480 Paroxysmal atrial fibrillation: Secondary | ICD-10-CM | POA: Diagnosis present

## 2024-01-25 DIAGNOSIS — Z90710 Acquired absence of both cervix and uterus: Secondary | ICD-10-CM | POA: Diagnosis not present

## 2024-01-25 DIAGNOSIS — Z8249 Family history of ischemic heart disease and other diseases of the circulatory system: Secondary | ICD-10-CM | POA: Diagnosis not present

## 2024-01-25 DIAGNOSIS — Z9981 Dependence on supplemental oxygen: Secondary | ICD-10-CM | POA: Diagnosis not present

## 2024-01-25 DIAGNOSIS — Z7984 Long term (current) use of oral hypoglycemic drugs: Secondary | ICD-10-CM | POA: Diagnosis not present

## 2024-01-25 DIAGNOSIS — R52 Pain, unspecified: Secondary | ICD-10-CM | POA: Diagnosis not present

## 2024-01-25 DIAGNOSIS — Z7902 Long term (current) use of antithrombotics/antiplatelets: Secondary | ICD-10-CM | POA: Diagnosis not present

## 2024-01-25 DIAGNOSIS — Z79899 Other long term (current) drug therapy: Secondary | ICD-10-CM | POA: Diagnosis not present

## 2024-01-25 MED ORDER — BUDESON-GLYCOPYRROL-FORMOTEROL 160-9-4.8 MCG/ACT IN AERO
2.0000 | INHALATION_SPRAY | Freq: Two times a day (BID) | RESPIRATORY_TRACT | Status: DC
  Administered 2024-01-25 – 2024-01-27 (×4): 2 via RESPIRATORY_TRACT
  Filled 2024-01-25: qty 5.9

## 2024-01-25 MED ORDER — CLONAZEPAM 0.5 MG PO TABS
0.5000 mg | ORAL_TABLET | Freq: Two times a day (BID) | ORAL | Status: DC | PRN
  Administered 2024-01-25 (×2): 0.5 mg via ORAL
  Filled 2024-01-25 (×2): qty 1

## 2024-01-25 MED ORDER — IPRATROPIUM-ALBUTEROL 20-100 MCG/ACT IN AERS
1.0000 | INHALATION_SPRAY | Freq: Four times a day (QID) | RESPIRATORY_TRACT | Status: DC
  Administered 2024-01-25 – 2024-01-27 (×9): 1 via RESPIRATORY_TRACT
  Filled 2024-01-25: qty 4

## 2024-01-25 MED ORDER — METHYLPREDNISOLONE SODIUM SUCC 40 MG IJ SOLR
40.0000 mg | Freq: Every day | INTRAMUSCULAR | Status: DC
  Administered 2024-01-25 – 2024-01-27 (×3): 40 mg via INTRAVENOUS
  Filled 2024-01-25 (×2): qty 1

## 2024-01-25 NOTE — Progress Notes (Signed)
 Physical Therapy Treatment Patient Details Name: Wendy Ferrell MRN: [id number] DOB: [birth year] Today's Date: 01/25/2024   History of Present Illness Wendy Ferrell is a 68 year old female with history of COPD, non insulin-dependent diabetes melitis, hypertension, PVD on Eliquis , COPD, chronic hypoxic respiratory failure requiring baseline 4 L nasal cannula at baseline, who presents emergency department for chief concerns of a fall yesterday. L Hip X-Ray:  left acute minimally displaced parasymphyseal fracture of left pubic body    PT Comments  Pt agrees to PT session on 3rd attempt. First 2 attempts, pt very anxious/SOB while I'ly seated EOB. MD adjusted medications and pt much more pleasant and cooperative this afternoon. She remained on her baseline 4 L o2 throughout session with sao2 > 92%. Pt was able to exit bed, stand to RW, and tolerate ambulation ~ 30 ft on 4 L o2. No LOB or unsteadiness observed. Pt tolerated session well but is self limiting. Max encouragement to attempt stairs to simulate home entry however pt unwilling. " My boyfriend will carry me up the stairs worse case." Pt is progressing well overall from a PT standpoint. Recommend continued skilled PT at [facility] to maximize her independence and safety with all ADLs.     If plan is discharge home, recommend the following: A little help with bathing/dressing/bathroom;A lot of help with walking and/or transfers;Assist for transportation;Help with stairs or ramp for entrance     Equipment Recommendations  None recommended by PT (Pt has recieved personal DME to her hospital room already)       Precautions / Restrictions Precautions Precautions: Fall Recall of Precautions/Restrictions: Intact Restrictions Weight Bearing Restrictions Per Provider Order: Yes LLE Weight Bearing Per Provider Order: Weight bearing as tolerated     Mobility  Bed Mobility Overal bed mobility: Needs Assistance Bed Mobility: Supine to Sit, Sit to Supine   Supine to sit: Supervision Sit to supine: Supervision   Transfers Overall transfer level: Needs assistance Equipment used: Rolling walker (2 wheels) Transfers: Sit to/from Stand Sit to Stand: Supervision  General transfer comment: no physical assistnace required to stand. vsc only for improved technique    Ambulation/Gait Ambulation/Gait assistance: Supervision Gait Distance (Feet): 30 Feet Assistive device: Rolling walker (2 wheels) Gait Pattern/deviations: Step-to pattern  General Gait Details: Pt easily and safely ambulated 30 ft with RW. no LOB however pt self limit distance. " I don't have to walk even this far in my trailer." Chartered loss adjuster encouraged pt to attempt stairs to simulate home entry/exit however pt states." my boyfriend will just carry me up them if he has too."   Stairs Stairs:  (unwilling)    Balance Overall balance assessment: Needs assistance Sitting-balance support: Feet supported, No upper extremity supported Sitting balance-Leahy Scale: Good     Standing balance support: Bilateral upper extremity supported, During functional activity Standing balance-Leahy Scale: Good Standing balance comment: no LOB       Communication Communication Communication: No apparent difficulties  Cognition Arousal: Alert Behavior During Therapy: WFL for tasks assessed/performed, Anxious (very anxious on first 2 attempts, cooperativ and calm on third. Author feels its medication related. MD adjust meds today)   PT - Cognitive impairments: No apparent impairments    PT - Cognition Comments: Pt agreed to PT session after two earlier attempts. Pt was seated EOB on first 2 attempt, SOB, and severely anxious. On thrid attempt, pt sleeping but easily awakes and is agreeable to session." I like that doctor, he fixed my medications." Following commands: Intact Following commands impaired: Only  follows one step commands consistently    Cueing Cueing Techniques: Verbal cues     General  Comments General comments (skin integrity, edema, etc.): reviewed importance of routine mobility and continues wt bearing on LLE to promote healing      Pertinent Vitals/Pain Pain Assessment Pain Assessment: 0-10 Pain Score: 4  Pain Location: L hip Pain Descriptors / Indicators: Grimacing Pain Intervention(s): Limited activity within patient's tolerance, Monitored during session, Premedicated before session, Repositioned     PT Goals (current goals can now be found in the care plan section) Acute Rehab PT Goals Patient Stated Goal: to go home Progress towards PT goals: Progressing toward goals    Frequency    7X/week       AM-PAC PT "6 Clicks" Mobility   Outcome Measure  Help needed turning from your back to your side while in a flat bed without using bedrails?: A Little Help needed moving from lying on your back to sitting on the side of a flat bed without using bedrails?: A Little Help needed moving to and from a bed to a chair (including a wheelchair)?: A Little Help needed standing up from a chair using your arms (e.g., wheelchair or bedside chair)?: A Little Help needed to walk in hospital room?: A Little Help needed climbing 3-5 steps with a railing? : A Lot 6 Click Score: 17    End of Session Equipment Utilized During Treatment: Oxygen  Activity Tolerance: Patient tolerated treatment well;Patient limited by fatigue Patient left: in bed;with bed alarm set;with call bell/phone within reach Nurse Communication: Mobility status PT Visit Diagnosis: Unsteadiness on feet (R26.81);Muscle weakness (generalized) (M62.81);History of falling (Z91.81);Difficulty in walking, not elsewhere classified (R26.2);Pain Pain - Right/Left: Left Pain - part of body: Hip     Time: 1610-9604 PT Time Calculation (min) (ACUTE ONLY): 24 min  Charges:    $Gait Training: 8-22 mins $Therapeutic Activity: 8-22 mins PT General Charges $$ ACUTE PT VISIT: 1 Visit                      Chester Costa PTA 01/25/24, 4:02 PM

## 2024-01-25 NOTE — Plan of Care (Signed)
  Problem: Coping: Goal: Level of anxiety will decrease Outcome: Progressing   Problem: Elimination: Goal: Will not experience complications related to bowel motility Outcome: Progressing   Problem: Elimination: Goal: Will not experience complications related to urinary retention Outcome: Progressing   Problem: Pain Managment: Goal: General experience of comfort will improve and/or be controlled Outcome: Progressing

## 2024-01-25 NOTE — Progress Notes (Signed)
 PROGRESS NOTE    Wendy HEPNER  ZOX:[id number] DOB: 08-24-1956 DOA: 01/21/2024 PCP: Shari Daughters, MD    Brief Narrative:  68 year old female with history of COPD, non insulin-dependent diabetes melitis, hypertension, PVD on Eliquis , COPD, chronic hypoxic respiratory failure requiring baseline 4 L nasal cannula at baseline, who presents emergency department for chief concerns of a fall yesterday.   Non displaced pelvic fracture noted on imaging.  No surgical intervention warranted.  Weightbearing as tolerated.  Therapy evaluations requested.  Pain control.  5/16: Patient had worsening of respiratory status overnight.  Increased shortness of breath.  Cough.  Increasing oxygen  demand 5 L.  5/17: Back down to 4 L.  Breath sounds improving but patient feeling very anxious   Assessment & Plan:   Principal Problem:   Inadequate pain control Active Problems:   Hyperlipidemia   PAD (peripheral artery disease) (HCC)   COPD (chronic obstructive pulmonary disease) (HCC)   Nicotine  dependence, uncomplicated   Essential hypertension   Diabetes (HCC)   COPD exacerbation (HCC)  Acute exacerbation of COPD Acute on chronic hypoxic respiratory failure Patient had a decline in her respiratory status on 5/16.  Increased oxygen  demand to 5 L associated with cough and shortness of breath. Breath sounds diminished bilaterally with mild end expiratory wheeze Plan: Decrease Solu-Medrol  40 mg once daily Switch nebulizer regimen to MDI Currently at goal rate oxygen .  Vitals per unit protocol   Inadequate pain control Acute pelvic fracture No surgical indications.  Patient having difficulty with mobility efforts Pain control slowly improving Plan: With multimodal pain regimen.  Continue therapy evaluations.  Hopeful to discharge within the next 1 to 2 days.  Paroxysmal atrial fibrillation with rapid ventricular response Rapid rate likely influenced by inadequate pain  control Improving Plan: Continue metoprolol  12.5 mg twice daily.  Heart rate appears controlled.  Continue Eliquis  twice daily   Essential hypertension Continue metoprolol  as above   Nicotine  dependence, uncomplicated Continue as needed nicotine  patch.  Advised smoking cessation    PAD (peripheral artery disease) (HCC) Continue apixaban  and Plavix    Hyperlipidemia Continue statin   DVT prophylaxis: Eliquis  Code Status: Full Family Communication: Significant other at bedside 5/14 Disposition Plan: Status is: Inpatient Remains inpatient appropriate because: COPD exacerbation     Level of care: Telemetry Medical  Consultants:  None  Procedures:  None  Antimicrobials: None   Subjective: Seen and examined.  Sitting up on edge of bed.  Endorses anxiety and shortness of breath  Objective: Vitals:   01/24/24 1517 01/24/24 2013 01/25/24 0328 01/25/24 0731  BP: (!) 140/85 (!) 145/73 (!) 146/55 137/70  Pulse: 98 88 97 92  Resp: 16 20 20 15   Temp: 97.6 F (36.4 C) 97.9 F (36.6 C) 98.1 F (36.7 C) 98 F (36.7 C)  TempSrc: Oral Oral  Oral  SpO2: 97% 100% 96% 98%  Weight:      Height:        Intake/Output Summary (Last 24 hours) at 01/25/2024 1119 Last data filed at 01/24/2024 1900 Gross per 24 hour  Intake 240 ml  Output --  Net 240 ml   Filed Weights   01/21/24 1624  Weight: 48.5 kg    Examination:  General exam: Appears anxious.  Frail and chronically ill-appearing Respiratory system: Improved air entry.  No wheeze.  Mild crackles.  4 L Cardiovascular system: S1 and 2, regular rate, irregular rhythm, no murmurs Gastrointestinal system: Thin, soft, NT/ND, normal bowel sounds Central nervous system: Alert and oriented. No  focal neurological deficits. Extremities: Symmetric decreased power bilateral lower extremities.  Gait not assessed Skin: No rashes, lesions or ulcers Psychiatry: Judgement and insight appear normal. Mood & affect depressed.      Data Reviewed: I have personally reviewed following labs and imaging studies  CBC: Recent Labs  Lab 01/21/24 1830 01/22/24 0224  WBC 13.7* 7.2  NEUTROABS 11.8*  --   HGB 12.7 11.5*  HCT 37.0 33.0*  MCV 93.4 92.7  PLT 331 289   Basic Metabolic Panel: Recent Labs  Lab 01/21/24 1830 01/22/24 0224  NA 130* 131*  K 3.7 3.4*  CL 80* 85*  CO2 38* 39*  GLUCOSE 117* 110*  BUN 12 16  CREATININE 0.64 0.66  CALCIUM  9.0 8.2*   GFR: Estimated Creatinine Clearance: 53 mL/min (by C-G formula based on SCr of 0.66 mg/dL). Liver Function Tests: No results for input(s): "AST", "ALT", "ALKPHOS", "BILITOT", "PROT", "ALBUMIN" in the last 168 hours. No results for input(s): "LIPASE", "AMYLASE" in the last 168 hours. No results for input(s): "AMMONIA" in the last 168 hours. Coagulation Profile: No results for input(s): "INR", "PROTIME" in the last 168 hours. Cardiac Enzymes: No results for input(s): "CKTOTAL", "CKMB", "CKMBINDEX", "TROPONINI" in the last 168 hours. BNP (last 3 results) No results for input(s): "PROBNP" in the last 8760 hours. HbA1C: No results for input(s): "HGBA1C" in the last 72 hours. CBG: No results for input(s): "GLUCAP" in the last 168 hours. Lipid Profile: No results for input(s): "CHOL", "HDL", "LDLCALC", "TRIG", "CHOLHDL", "LDLDIRECT" in the last 72 hours. Thyroid  Function Tests: No results for input(s): "TSH", "T4TOTAL", "FREET4", "T3FREE", "THYROIDAB" in the last 72 hours. Anemia Panel: No results for input(s): "VITAMINB12", "FOLATE", "FERRITIN", "TIBC", "IRON", "RETICCTPCT" in the last 72 hours. Sepsis Labs: No results for input(s): "PROCALCITON", "LATICACIDVEN" in the last 168 hours.  No results found for this or any previous visit (from the past 240 hours).       Radiology Studies: DG Chest Port 1 View Result Date: 01/24/2024 CLINICAL DATA:  Shortness of breath EXAM: PORTABLE CHEST 1 VIEW COMPARISON:  X-ray 03/30/2020.  CT 03/31/2020  FINDINGS: Hyperinflation with chronic lung changes. Blunting of the costophrenic angles could be tiny effusion versus pleural thickening. Slight lung base opacities. No pneumothorax or edema. Question left midlung nodule perihilar. Different appearance than prior x-ray normal cardiopericardial silhouette. Calcified aorta. Osteopenia and degenerative changes IMPRESSION: Hyperinflation with chronic changes. Question tiny pleural effusion versus pleural thickening and mild lung base opacities. Atelectasis versus subtle infiltrate. There is a small nodular focus just lateral to left lung hilum which is different appearance on the old x-ray. A subtle lesion is not excluded. Chest recommended when appropriate Electronically Signed   By: Adrianna Horde M.D.   On: 01/24/2024 11:17        Scheduled Meds:  acetaminophen   1,000 mg Oral TID   apixaban   5 mg Oral BID   atorvastatin   40 mg Oral QHS   budesonide -glycopyrrolate -formoterol   2 puff Inhalation BID   clopidogrel   75 mg Oral Daily   docusate sodium   100 mg Oral BID   guaiFENesin   600 mg Oral BID   Ipratropium-Albuterol   1 puff Inhalation QID   losartan   25 mg Oral Daily   methylPREDNISolone  (SOLU-MEDROL ) injection  40 mg Intravenous Daily   metoprolol  tartrate  12.5 mg Oral BID   pantoprazole   40 mg Oral Daily   Continuous Infusions:   LOS: 0 days      Tiajuana Fluke, MD [facility]  If 7PM-7AM, please contact night-coverage  01/25/2024, 11:19 AM

## 2024-01-26 DIAGNOSIS — R52 Pain, unspecified: Secondary | ICD-10-CM | POA: Diagnosis not present

## 2024-01-26 LAB — CBC WITH DIFFERENTIAL/PLATELET
Abs Immature Granulocytes: 0.09 10*3/uL — ABNORMAL HIGH (ref 0.00–0.07)
Basophils Absolute: 0 10*3/uL (ref 0.0–0.1)
Basophils Relative: 0 %
Eosinophils Absolute: 0 10*3/uL (ref 0.0–0.5)
Eosinophils Relative: 0 %
HCT: 33.1 % — ABNORMAL LOW (ref 36.0–46.0)
Hemoglobin: 11.2 g/dL — ABNORMAL LOW (ref 12.0–15.0)
Immature Granulocytes: 1 %
Lymphocytes Relative: 9 %
Lymphs Abs: 1.4 10*3/uL (ref 0.7–4.0)
MCH: 31.5 pg (ref 26.0–34.0)
MCHC: 33.8 g/dL (ref 30.0–36.0)
MCV: 93 fL (ref 80.0–100.0)
Monocytes Absolute: 1.1 10*3/uL — ABNORMAL HIGH (ref 0.1–1.0)
Monocytes Relative: 7 %
Neutro Abs: 13 10*3/uL — ABNORMAL HIGH (ref 1.7–7.7)
Neutrophils Relative %: 83 %
Platelets: 336 10*3/uL (ref 150–400)
RBC: 3.56 MIL/uL — ABNORMAL LOW (ref 3.87–5.11)
RDW: 13.6 % (ref 11.5–15.5)
WBC: 15.6 10*3/uL — ABNORMAL HIGH (ref 4.0–10.5)
nRBC: 0 % (ref 0.0–0.2)

## 2024-01-26 LAB — BASIC METABOLIC PANEL WITH GFR
Anion gap: 9 (ref 5–15)
BUN: 23 mg/dL (ref 8–23)
CO2: 36 mmol/L — ABNORMAL HIGH (ref 22–32)
Calcium: 8.9 mg/dL (ref 8.9–10.3)
Chloride: 85 mmol/L — ABNORMAL LOW (ref 98–111)
Creatinine, Ser: 0.68 mg/dL (ref 0.44–1.00)
GFR, Estimated: >60 mL/min (ref >60)
Glucose, Bld: 113 mg/dL — ABNORMAL HIGH (ref 70–99)
Potassium: 4.8 mmol/L (ref 3.5–5.1)
Sodium: 130 mmol/L — ABNORMAL LOW (ref 135–145)

## 2024-01-26 NOTE — Plan of Care (Signed)
  Problem: Clinical Measurements: Goal: Diagnostic test results will improve Outcome: Progressing Goal: Respiratory complications will improve Outcome: Progressing   Problem: Activity: Goal: Risk for activity intolerance will decrease Outcome: Progressing   Problem: Nutrition: Goal: Adequate nutrition will be maintained Outcome: Progressing   Problem: Coping: Goal: Level of anxiety will decrease Outcome: Progressing   Problem: Pain Managment: Goal: General experience of comfort will improve and/or be controlled Outcome: Progressing

## 2024-01-26 NOTE — Progress Notes (Signed)
 Physical Therapy Treatment Patient Details Name: Wendy Ferrell MRN: [id number] DOB: Nov 21, 1955 Today's Date: 01/26/2024   History of Present Illness Wendy Ferrell is a 68 year old female with history of COPD, non insulin-dependent diabetes melitis, hypertension, PVD on Eliquis , COPD, chronic hypoxic respiratory failure requiring baseline 4 L nasal cannula at baseline, who presents emergency department for chief concerns of a fall yesterday. L Hip X-Ray:  left acute minimally displaced parasymphyseal fracture of left pubic body    PT Comments  Pt sitting EOB with nursing this am stated she slept well and is feeling good.  She does stand with min a x 1 today and gait for 8' with general unsteadiness and occasional imbalances requiring min a x 1.  Pt then stated she is having a bad morning with her breathing and needs to sit back down.  Sats 84% on 4 lpm with gait but increase to 95% with seated rest.  When questioned, pt stated she does have "bad days" at home where breathing limits mobility and she needs to cancel MD appointments as she is unable to manage to get to/from car due to distance and stairs and reschedule on a "better day".  Stated she does not have a wheelchair at home.  Some days/times she is able to walk household distances but others she is limited to her bed/bathroom.  She does feel a wheelchair would be helpful.  Will reach out to team and request prior to DC.  Pt will need +1 assist for mobility and safety.  She continues to request DC home vs SNF.  Patient suffers from COPD which impairs his/her ability to perform daily activities like toileting, feeding, dressing, grooming, bathing in the home. A cane, walker, crutch will not resolve the patient's issue with performing activities of daily living. A lightweight wheelchair and cushion is required/recommended and will allow patient to safely perform daily activities.   Patient can safely propel the wheelchair in the home or has a caregiver  who can provide assistance.     If plan is discharge home, recommend the following: A little help with bathing/dressing/bathroom;Assist for transportation;Help with stairs or ramp for entrance;A little help with walking and/or transfers   Can travel by private vehicle        Equipment Recommendations  Wheelchair (measurements PT);Wheelchair cushion (measurements PT) (Pt has recieved personal DME to her hospital room already)    Recommendations for Other Services       Precautions / Restrictions Precautions Precautions: Fall Recall of Precautions/Restrictions: Intact Restrictions Weight Bearing Restrictions Per Provider Order: Yes LLE Weight Bearing Per Provider Order: Weight bearing as tolerated     Mobility  Bed Mobility Overal bed mobility: Modified Independent       Supine to sit: Modified independent (Device/Increase time) Sit to supine: Modified independent (Device/Increase time)     Patient Response: Cooperative  Transfers Overall transfer level: Needs assistance Equipment used: Rolling walker (2 wheels) Transfers: Sit to/from Stand Sit to Stand: Contact guard assist                Ambulation/Gait Ambulation/Gait assistance: Contact guard assist, Min assist Gait Distance (Feet): 8 Feet Assistive device: Rolling walker (2 wheels) Gait Pattern/deviations: Step-to pattern       General Gait Details: generally unsteady today with occasional imbalances needing +1 for safety for limited gait due to SOB   Stairs             Wheelchair Mobility     Tilt Bed Tilt Bed Patient  Response: Cooperative  Modified Rankin (Stroke Patients Only)       Balance Overall balance assessment: Needs assistance Sitting-balance support: Feet supported, No upper extremity supported Sitting balance-Leahy Scale: Good     Standing balance support: Bilateral upper extremity supported, During functional activity Standing balance-Leahy Scale: Poor Standing  balance comment: unsteady today                            Communication Communication Communication: No apparent difficulties  Cognition Arousal: Alert Behavior During Therapy: WFL for tasks assessed/performed, Anxious   PT - Cognitive impairments: No apparent impairments                         Following commands: Intact Following commands impaired: Follows one step commands inconsistently    Cueing Cueing Techniques: Verbal cues  Exercises      General Comments        Pertinent Vitals/Pain Pain Assessment Pain Assessment: Faces Faces Pain Scale: Hurts a little bit Pain Location: L hip Pain Descriptors / Indicators: Sore Pain Intervention(s): Limited activity within patient's tolerance, Monitored during session, Repositioned    Home Living                          Prior Function            PT Goals (current goals can now be found in the care plan section) Progress towards PT goals: Progressing toward goals    Frequency    7X/week      PT Plan      Co-evaluation              AM-PAC PT "6 Clicks" Mobility   Outcome Measure  Help needed turning from your back to your side while in a flat bed without using bedrails?: A Little Help needed moving from lying on your back to sitting on the side of a flat bed without using bedrails?: A Little Help needed moving to and from a bed to a chair (including a wheelchair)?: A Little Help needed standing up from a chair using your arms (e.g., wheelchair or bedside chair)?: A Little Help needed to walk in hospital room?: A Little Help needed climbing 3-5 steps with a railing? : A Lot 6 Click Score: 17    End of Session Equipment Utilized During Treatment: Oxygen  Activity Tolerance: Patient tolerated treatment well;Patient limited by fatigue Patient left: in bed;with bed alarm set;with call bell/phone within reach Nurse Communication: Mobility status PT Visit Diagnosis:  Unsteadiness on feet (R26.81);Muscle weakness (generalized) (M62.81);History of falling (Z91.81);Difficulty in walking, not elsewhere classified (R26.2);Pain Pain - Right/Left: Left Pain - part of body: Hip     Time: 1610-9604 PT Time Calculation (min) (ACUTE ONLY): 15 min  Charges:    $Gait Training: 8-22 mins PT General Charges $$ ACUTE PT VISIT: 1 Visit                   Charlanne Cong, PTA 01/26/24, 8:52 AM

## 2024-01-26 NOTE — Progress Notes (Signed)
 PROGRESS NOTE    Wendy Ferrell  ZOX:[id number] DOB: 1956/05/14 DOA: 01/21/2024 PCP: Shari Daughters, MD    Brief Narrative:  68 year old female with history of COPD, non insulin-dependent diabetes melitis, hypertension, PVD on Eliquis , COPD, chronic hypoxic respiratory failure requiring baseline 4 L nasal cannula at baseline, who presents emergency department for chief concerns of a fall yesterday.   Non displaced pelvic fracture noted on imaging.  No surgical intervention warranted.  Weightbearing as tolerated.  Therapy evaluations requested.  Pain control.  5/16: Patient had worsening of respiratory status overnight.  Increased shortness of breath.  Cough.  Increasing oxygen  demand 5 L.  5/17: Back down to 4 L.  Breath sounds improving but patient feeling very anxious   Assessment & Plan:   Principal Problem:   Inadequate pain control Active Problems:   Hyperlipidemia   PAD (peripheral artery disease) (HCC)   COPD (chronic obstructive pulmonary disease) (HCC)   Nicotine  dependence, uncomplicated   Essential hypertension   Diabetes (HCC)   COPD exacerbation (HCC)  Acute exacerbation of COPD Acute on chronic hypoxic respiratory failure Patient had a decline in her respiratory status on 5/16.  Increased oxygen  demand to 5 L associated with cough and shortness of breath. Breath sounds diminished bilaterally with mild end expiratory wheeze Plan: Continue Solu-Medrol  40 mg once daily for today Weaned to p.o. prednisone  starting 5/19 Continue MDI regimen Anticipate readiness for discharge 5/19   Inadequate pain control Acute pelvic fracture No surgical indications.  Patient having difficulty with mobility efforts Pain control slowly improving Plan: Continue with multimodal pain regimen.  Continue therapy evaluations.  Patient continues to decline skilled nursing facility.  Hopeful to discharge within 24 hours.  Paroxysmal atrial fibrillation with rapid ventricular  response Rapid rate likely influenced by inadequate pain control Improving Plan: Continue Lopressor  12.5 mg daily.  Heart rate controlled.  Continue Eliquis .  Will likely continue beta-blockade strategy on discharge  Essential hypertension Continue metoprolol  as above   Nicotine  dependence, uncomplicated Continue as needed nicotine  patch.  Advised smoking cessation    PAD (peripheral artery disease) (HCC) Continue apixaban  and Plavix    Hyperlipidemia Continue statin   DVT prophylaxis: Eliquis  Code Status: DNR Family Communication: Significant other at bedside 5/14, daughter at bedside 5/18 Disposition Plan: Status is: Inpatient Remains inpatient appropriate because: COPD exacerbation     Level of care: Telemetry Medical  Consultants:  None  Procedures:  None  Antimicrobials: None   Subjective: Seen and examined.  Lying in bed.  Had some worsening shortness of breath this morning but improved now.  Objective: Vitals:   01/25/24 1937 01/25/24 2133 01/26/24 0409 01/26/24 0744  BP: 128/71 128/71 117/60 (!) 93/58  Pulse: 97 97 67 72  Resp: 19  18 16   Temp: 98.2 F (36.8 C)  (!) 97.5 F (36.4 C) 98 F (36.7 C)  TempSrc:    Oral  SpO2: 100%  100% 100%  Weight:      Height:        Intake/Output Summary (Last 24 hours) at 01/26/2024 1032 Last data filed at 01/25/2024 1900 Gross per 24 hour  Intake 240 ml  Output --  Net 240 ml   Filed Weights   01/21/24 1624  Weight: 48.5 kg    Examination:  General exam: NAD.  Frail and chronically ill Respiratory system: Improved air entry.  Mild wheeze.  Mild crackles.  4 L Cardiovascular system: S1 and 2, regular rate, irregular rhythm, no murmurs Gastrointestinal system: Thin, soft, NT/ND,  normal bowel sounds Central nervous system: Alert and oriented. No focal neurological deficits. Extremities: Symmetric decreased power bilateral lower extremities.  Gait not assessed Skin: No rashes, lesions or  ulcers Psychiatry: Judgement and insight appear normal. Mood & affect depressed.     Data Reviewed: I have personally reviewed following labs and imaging studies  CBC: Recent Labs  Lab 01/21/24 1830 01/22/24 0224 01/26/24 0851  WBC 13.7* 7.2 15.6*  NEUTROABS 11.8*  --  13.0*  HGB 12.7 11.5* 11.2*  HCT 37.0 33.0* 33.1*  MCV 93.4 92.7 93.0  PLT 331 289 336   Basic Metabolic Panel: Recent Labs  Lab 01/21/24 1830 01/22/24 0224 01/26/24 0851  NA 130* 131* 130*  K 3.7 3.4* 4.8  CL 80* 85* 85*  CO2 38* 39* 36*  GLUCOSE 117* 110* 113*  BUN 12 16 23   CREATININE 0.64 0.66 0.68  CALCIUM  9.0 8.2* 8.9   GFR: Estimated Creatinine Clearance: 53 mL/min (by C-G formula based on SCr of 0.68 mg/dL). Liver Function Tests: No results for input(s): "AST", "ALT", "ALKPHOS", "BILITOT", "PROT", "ALBUMIN" in the last 168 hours. No results for input(s): "LIPASE", "AMYLASE" in the last 168 hours. No results for input(s): "AMMONIA" in the last 168 hours. Coagulation Profile: No results for input(s): "INR", "PROTIME" in the last 168 hours. Cardiac Enzymes: No results for input(s): "CKTOTAL", "CKMB", "CKMBINDEX", "TROPONINI" in the last 168 hours. BNP (last 3 results) No results for input(s): "PROBNP" in the last 8760 hours. HbA1C: No results for input(s): "HGBA1C" in the last 72 hours. CBG: No results for input(s): "GLUCAP" in the last 168 hours. Lipid Profile: No results for input(s): "CHOL", "HDL", "LDLCALC", "TRIG", "CHOLHDL", "LDLDIRECT" in the last 72 hours. Thyroid  Function Tests: No results for input(s): "TSH", "T4TOTAL", "FREET4", "T3FREE", "THYROIDAB" in the last 72 hours. Anemia Panel: No results for input(s): "VITAMINB12", "FOLATE", "FERRITIN", "TIBC", "IRON", "RETICCTPCT" in the last 72 hours. Sepsis Labs: No results for input(s): "PROCALCITON", "LATICACIDVEN" in the last 168 hours.  No results found for this or any previous visit (from the past 240 hours).        Radiology Studies: No results found.       Scheduled Meds:  acetaminophen   1,000 mg Oral TID   apixaban   5 mg Oral BID   atorvastatin   40 mg Oral QHS   budesonide -glycopyrrolate -formoterol   2 puff Inhalation BID   clopidogrel   75 mg Oral Daily   docusate sodium   100 mg Oral BID   guaiFENesin   600 mg Oral BID   Ipratropium-Albuterol   1 puff Inhalation QID   losartan   25 mg Oral Daily   methylPREDNISolone  (SOLU-MEDROL ) injection  40 mg Intravenous Daily   metoprolol  tartrate  12.5 mg Oral BID   pantoprazole   40 mg Oral Daily   Continuous Infusions:   LOS: 1 day      Tiajuana Fluke, MD [facility]   If 7PM-7AM, please contact night-coverage  01/26/2024, 10:32 AM

## 2024-01-27 ENCOUNTER — Other Ambulatory Visit: Payer: Self-pay

## 2024-01-27 DIAGNOSIS — R52 Pain, unspecified: Secondary | ICD-10-CM | POA: Diagnosis not present

## 2024-01-27 MED ORDER — PREDNISONE 20 MG PO TABS
40.0000 mg | ORAL_TABLET | Freq: Every day | ORAL | 0 refills | Status: AC
  Filled 2024-01-27: qty 10, 5d supply, fill #0

## 2024-01-27 MED ORDER — OXYCODONE HCL 5 MG PO TABS
5.0000 mg | ORAL_TABLET | ORAL | 0 refills | Status: DC | PRN
  Filled 2024-01-27: qty 30, 3d supply, fill #0

## 2024-01-27 NOTE — Progress Notes (Signed)
 Occupational Therapy Treatment Patient Details Name: Wendy Ferrell MRN: [id number] DOB: 07-26-1956 Today's Date: 01/27/2024   History of present illness Wendy Ferrell is a 69 year old female with history of COPD, non insulin-dependent diabetes melitis, hypertension, PVD on Eliquis , COPD, chronic hypoxic respiratory failure requiring baseline 4 L nasal cannula at baseline, who presents emergency department for chief concerns of a fall yesterday. L Hip X-Ray:  left acute minimally displaced parasymphyseal fracture of left pubic body   OT comments  [name] seen for OT treatment on this date. Upon arrival to room pt seated in bed, agreeable to tx. Pt requires MOD I bed mobility, UB/LB dressing, and toileting on BSC. Pt demonstrated teach-back of energy conservation strategies and had appropriate safety awareness. Pt making good progress toward goals, will continue to follow POC. Discharge recommendation remains appropriate.        If plan is discharge home, recommend the following:  Help with stairs or ramp for entrance;A little help with walking and/or transfers;A little help with bathing/dressing/bathroom   Equipment Recommendations  BSC/3in1;Wheelchair (measurements OT)    Recommendations for Other Services      Precautions / Restrictions Precautions Precautions: Fall Recall of Precautions/Restrictions: Intact Restrictions Weight Bearing Restrictions Per Provider Order: Yes LLE Weight Bearing Per Provider Order: Weight bearing as tolerated       Mobility Bed Mobility Overal bed mobility: Modified Independent                  Transfers Overall transfer level: Modified independent                       Balance   Sitting-balance support: Feet supported, No upper extremity supported       Standing balance support: During functional activity, No upper extremity supported Standing balance-Leahy Scale: Good                             ADL either  performed or assessed with clinical judgement   ADL Overall ADL's : Modified independent                                       General ADL Comments: MOD I for UB/LB dressing and toileting on [facility]    Extremity/Trunk Assessment Upper Extremity Assessment Upper Extremity Assessment: Generalized weakness   Lower Extremity Assessment Lower Extremity Assessment: Generalized weakness        Vision       Perception     Praxis     Communication Communication Communication: No apparent difficulties   Cognition Arousal: Alert Behavior During Therapy: WFL for tasks assessed/performed, Anxious Cognition: No apparent impairments                               Following commands: Intact Following commands impaired: Follows one step commands with increased time      Cueing   Cueing Techniques: Verbal cues  Exercises      Shoulder Instructions       General Comments      Pertinent Vitals/ Pain       Pain Assessment Pain Assessment: No/denies pain  Home Living  Prior Functioning/Environment              Frequency  Min 2X/week        Progress Toward Goals  OT Goals(current goals can now be found in the care plan section)  Progress towards OT goals: Progressing toward goals  Acute Rehab OT Goals Patient Stated Goal: to go home OT Goal Formulation: With patient Time For Goal Achievement: 02/09/2024 Potential to Achieve Goals: Good ADL Goals Pt Will Perform Grooming: with modified independence;sitting Pt Will Perform Lower Body Dressing: with modified independence;sit to/from stand Pt Will Transfer to Toilet: with modified independence;ambulating;regular height toilet  Plan      Co-evaluation                 AM-PAC OT "6 Clicks" Daily Activity     Outcome Measure   Help from another person eating meals?: None Help from another person taking care of personal  grooming?: None Help from another person toileting, which includes using toliet, bedpan, or urinal?: None Help from another person bathing (including washing, rinsing, drying)?: A Little Help from another person to put on and taking off regular upper body clothing?: None Help from another person to put on and taking off regular lower body clothing?: None 6 Click Score: 23    End of Session    OT Visit Diagnosis: Unsteadiness on feet (R26.81);Muscle weakness (generalized) (M62.81)   Activity Tolerance Patient tolerated treatment well   Patient Left in bed;with call bell/phone within reach (Sitting EOB w/ cart in front)   Nurse Communication          Time: 7829-5621 OT Time Calculation (min): 16 min  Charges: OT General Charges $OT Visit: 1 Visit OT Treatments $Self Care/Home Management : 8-22 mins  Stevenson Elbe, Student OT   [employer] 01/27/2024, 11:29 AM

## 2024-01-27 NOTE — TOC Transition Note (Signed)
 Transition of Care [facility]) - Discharge Note   Patient Details  Name: Wendy Ferrell MRN: [id number] Date of Birth: 24-May-1956  Transition of Care [facility]) CM/SW Contact:  Crayton Docker, RN 01/27/2024, 12:04 PM   Clinical Narrative:     Discharge orders for home with home health.  CM to patient's room regarding pending discharge. Patient has portable oxygen  at bedside. Patient requests wheelchair for home delivery. Per patient has received BSC and rolling walker. [facility] following for home health PT/OT. CM call to [place], [facility] regarding pending discharge. CM call to Sam Creighton, [employer] regarding wheelchair order for home delivery. CM secure message to Dr. Mosetta Areola regarding DME order for wheelchair for MD signature.  Final next level of care: Home w Home Health Services Barriers to Discharge: No Barriers Identified   Patient Goals and CMS Choice Patient states their goals for this hospitalization and ongoing recovery are:: get better CMS Medicare.gov Compare Post Acute Care list provided to:: Patient Choice offered to / list presented to : Patient    Discharge Placement     Home health PT/OT DME:  [facility], [facility], Wheelchair     Discharge Plan and Services Additional resources added to the After Visit Summary for     Discharge Planning Services: CM Consult Post Acute Care Choice: Durable Medical Equipment          DME Arranged: Otho Blitz hemi, Walker rolling DME Agency: [facility] Date DME Agency Contacted: 01/22/24 Time DME Agency Contacted: [phone] Representative spoke with at DME Agency: Sam Creighton HH Arranged: PT, OT HH Agency: [facility] ([facility] Date HH Agency Contacted: 01/22/24 Time HH Agency Contacted: [phone] Representative spoke with at [facility] Agency: Shaun  Social Drivers of Health (SDOH) Interventions SDOH Screenings   Food Insecurity: No Food Insecurity (01/22/2024)  Housing: Low Risk  (01/22/2024)  Transportation Needs: No Transportation Needs  (01/22/2024)  Utilities: Not At Risk (01/22/2024)  Depression (PHQ2-9): Low Risk  (07/04/2023)  Social Connections: Moderately Integrated (01/22/2024)  Tobacco Use: Medium Risk (01/21/2024)     Readmission Risk Interventions     No data to display

## 2024-01-27 NOTE — Plan of Care (Signed)

## 2024-01-27 NOTE — Plan of Care (Signed)

## 2024-01-27 NOTE — Discharge Summary (Signed)
 Physician Discharge Summary  Wendy Ferrell MVH:[id number] DOB: 19-Nov-1955 DOA: 01/21/2024  PCP: Shari Daughters, MD  Admit date: 01/21/2024 Discharge date: 01/27/2024  Admitted From: Home Disposition:  Home w home health  Recommendations for Outpatient Follow-up:  Follow up with PCP in 1-2 weeks   Home Health:Yes PT OT  Equipment/Devices:Oxygen  4L (chronic)   Discharge Condition:Stable  CODE STATUS:DNR  Diet recommendation: Reg  Brief/Interim Summary:  68 year old female with history of COPD, non insulin-dependent diabetes melitis, hypertension, PVD on Eliquis , COPD, chronic hypoxic respiratory failure requiring baseline 4 L nasal cannula at baseline, who presents emergency department for chief concerns of a fall yesterday.    Non displaced pelvic fracture noted on imaging.  No surgical intervention warranted.  Weightbearing as tolerated.  Therapy evaluations requested.  Pain control.   5/16: Patient had worsening of respiratory status overnight.  Increased shortness of breath.  Cough.  Increasing oxygen  demand 5 L.   5/17: Back down to 4 L.  Breath sounds improving but patient feeling very anxious 5/19: Respiratory status at baseline.  Anxiety improved.  Pain reasonably well-controlled.  Stable for discharge home.  Declined skilled nursing facility.  Will send home with home health services and DME.    Discharge Diagnoses:  Principal Problem:   Inadequate pain control Active Problems:   Hyperlipidemia   PAD (peripheral artery disease) (HCC)   COPD (chronic obstructive pulmonary disease) (HCC)   Nicotine  dependence, uncomplicated   Essential hypertension   Diabetes (HCC)   COPD exacerbation (HCC) Acute exacerbation of COPD Acute on chronic hypoxic respiratory failure Patient had a decline in her respiratory status on 5/16.  Increased oxygen  demand to 5 L associated with cough and shortness of breath. Breath sounds diminished bilaterally with mild end expiratory  wheeze Improved at time of DC Plan: Discontinue IV Solu-Medrol .  Start p.o. prednisone  40 mg daily.  Plan for 5-day course.  Can discharge home and resume home MDI regimen.  Follow-up outpatient PCP 1 week.  I offered referral to pulmonology considering severity of lung disease however patient states that she would follow-up with her primary care physician and discuss pulmonology referral as needed.   Inadequate pain control Acute pelvic fracture No surgical indications.  Patient having difficulty with mobility efforts Pain control slowly improving Plan: Continue multimodal pain regimen.  Continue home health PT and OT.  Patient declined offer for skilled nursing facility.  Home with home health services and DME ordered.   Paroxysmal atrial fibrillation with rapid ventricular response Rapid rate likely influenced by inadequate pain control Improving Plan: Heart rate improved at time of discharge.  Likely the rapid rate exacerbated by underlying COPD flare and uncontrolled pain.  Will not initiate beta-blockade at time of discharge considering pre-existing severe lung disease.  Follow-up outpatient cardiology   Discharge Instructions  Discharge Instructions     Diet - low sodium heart healthy   Complete by: As directed    Increase activity slowly   Complete by: As directed       Allergies as of 01/27/2024   No Known Allergies      Medication List     TAKE these medications    atorvastatin  40 MG tablet Commonly known as: LIPITOR Take 1 tablet (40 mg total) by mouth daily.   cetirizine  10 MG tablet Commonly known as: ZYRTEC  TAKE 1 TABLET BY MOUTH EVERY DAY   chlorthalidone 25 MG tablet Commonly known as: HYGROTON TAKE 1/2 TABLET BY MOUTH DAILY AS DIRECTED   clopidogrel  75  MG tablet Commonly known as: PLAVIX  Take 1 tablet (75 mg total) by mouth daily.   Combivent  Respimat 20-100 MCG/ACT Aers respimat Generic drug: Ipratropium-Albuterol  Inhale 1 puff into the lungs  every 6 (six) hours as needed for wheezing.   Eliquis  5 MG Tabs tablet Generic drug: apixaban  TAKE 1 TABLET BY MOUTH TWICE A DAY   fluticasone  50 MCG/ACT nasal spray Commonly known as: FLONASE  Place 1 spray into both nostrils daily.   losartan  25 MG tablet Commonly known as: COZAAR  TAKE 1 TABLET BY MOUTH EVERY DAY   oxyCODONE  5 MG immediate release tablet Commonly known as: Oxy IR/ROXICODONE  Take 1-2 tablets (5-10 mg total) by mouth every 4 (four) hours as needed for moderate pain (pain score 4-6) or severe pain (pain score 7-10).   OXYGEN  Inhale into the lungs.   predniSONE  20 MG tablet Commonly known as: DELTASONE  Take 2 tablets (40 mg total) by mouth daily with breakfast for 5 days.   Trelegy Ellipta  100-62.5-25 MCG/ACT Aepb Generic drug: Fluticasone -Umeclidin-Vilant USE AS DIRECTED 1 INHALATION PER DAY               Durable Medical Equipment  (From admission, onward)           Start     Ordered   01/22/24 1254  For home use only DME Walker rolling  Once       Question Answer Comment  Walker: With 5 Inch Wheels   Patient needs a walker to treat with the following condition Weakness      01/22/24 1254   01/22/24 1254  For home use only DME Bedside commode  Once       Question:  Patient needs a bedside commode to treat with the following condition  Answer:  Weakness   01/22/24 1254            No Known Allergies  Consultations: None   Procedures/Studies: DG Chest Port 1 View Result Date: 01/24/2024 CLINICAL DATA:  Shortness of breath EXAM: PORTABLE CHEST 1 VIEW COMPARISON:  X-ray 03/30/2020.  CT 03/31/2020 FINDINGS: Hyperinflation with chronic lung changes. Blunting of the costophrenic angles could be tiny effusion versus pleural thickening. Slight lung base opacities. No pneumothorax or edema. Question left midlung nodule perihilar. Different appearance than prior x-ray normal cardiopericardial silhouette. Calcified aorta. Osteopenia and  degenerative changes IMPRESSION: Hyperinflation with chronic changes. Question tiny pleural effusion versus pleural thickening and mild lung base opacities. Atelectasis versus subtle infiltrate. There is a small nodular focus just lateral to left lung hilum which is different appearance on the old x-ray. A subtle lesion is not excluded. Chest recommended when appropriate Electronically Signed   By: Adrianna Horde M.D.   On: 01/24/2024 11:17   CT Hip Left Wo Contrast Result Date: 01/21/2024 CLINICAL DATA:  Hip trauma, fracture suspected, xray done EXAM: CT OF THE LEFT HIP WITHOUT CONTRAST TECHNIQUE: Multidetector CT imaging of the left hip was performed according to the standard protocol. Multiplanar CT image reconstructions were also generated. RADIATION DOSE REDUCTION: This exam was performed according to the departmental dose-optimization program which includes automated exposure control, adjustment of the mA and/or kV according to patient size and/or use of iterative reconstruction technique. COMPARISON:  Same day radiographs of the left hip dated 01/21/2024 at 4:54 p.m. FINDINGS: Bones/Joint/Cartilage Acute minimally displaced parasymphyseal fracture of the left pubic body. Pubic symphysis appears anatomically aligned. The remainder of the visualized bones appear intact. The left femoral head is seated within the acetabula with mild degenerative changes  of the left hip. Ligaments Suboptimally assessed by CT. Muscles and Tendons Musculature is within normal limits.  No acute abnormality. Soft tissues No fluid collection or hematoma. Peripheral atherosclerotic vascular calcifications are noted. IMPRESSION: Acute minimally displaced parasymphyseal fracture of the left pubic body. Pubic symphysis appears anatomically aligned. Electronically Signed   By: Mannie Seek M.D.   On: 01/21/2024 18:04   DG Hip Unilat W or Wo Pelvis 2-3 Views Left Result Date: 01/21/2024 CLINICAL DATA:  Left hip pain following a fall  yesterday. EXAM: DG HIP (WITH OR WITHOUT PELVIS) 2-3V LEFT COMPARISON:  None Available. FINDINGS: Minimal bilateral hip degenerative changes. No fracture or dislocation seen. Bilateral common iliac artery stents and atheromatous calcifications. Lower lumbar spine degenerative changes. IMPRESSION: 1. No fracture. 2. Minimal bilateral hip degenerative changes. Electronically Signed   By: Catherin Closs M.D.   On: 01/21/2024 17:09      Subjective: Seen and examined on the day of discharge.  Stable no distress.  Appropriate for discharge home.  Discharge Exam: Vitals:   01/27/24 0429 01/27/24 0856  BP: (!) 142/62 (!) 133/102  Pulse: 76 86  Resp: 20 18  Temp: 97.8 F (36.6 C) 97.8 F (36.6 C)  SpO2: 100% 97%   Vitals:   01/26/24 2049 01/27/24 0427 01/27/24 0429 01/27/24 0856  BP: (!) 125/55 130/79 (!) 142/62 (!) 133/102  Pulse: 86 (!) 57 76 86  Resp: 20 20 20 18   Temp: 98.1 F (36.7 C) 97.8 F (36.6 C) 97.8 F (36.6 C) 97.8 F (36.6 C)  TempSrc: Oral Oral Oral Oral  SpO2: 99% 100% 100% 97%  Weight:      Height:        General: Pt is alert, awake, not in acute distress Cardiovascular: RRR, S1/S2 +, no rubs, no gallops Respiratory: CTA bilaterally, no wheezing, no rhonchi Abdominal: Soft, NT, ND, bowel sounds + Extremities: no edema, no cyanosis    The results of significant diagnostics from this hospitalization (including imaging, microbiology, ancillary and laboratory) are listed below for reference.     Microbiology: No results found for this or any previous visit (from the past 240 hours).   Labs: BNP (last 3 results) No results for input(s): "BNP" in the last 8760 hours. Basic Metabolic Panel: Recent Labs  Lab 01/21/24 1830 01/22/24 0224 01/26/24 0851  NA 130* 131* 130*  K 3.7 3.4* 4.8  CL 80* 85* 85*  CO2 38* 39* 36*  GLUCOSE 117* 110* 113*  BUN 12 16 23   CREATININE 0.64 0.66 0.68  CALCIUM  9.0 8.2* 8.9   Liver Function Tests: No results for input(s):  "AST", "ALT", "ALKPHOS", "BILITOT", "PROT", "ALBUMIN" in the last 168 hours. No results for input(s): "LIPASE", "AMYLASE" in the last 168 hours. No results for input(s): "AMMONIA" in the last 168 hours. CBC: Recent Labs  Lab 01/21/24 1830 01/22/24 0224 01/26/24 0851  WBC 13.7* 7.2 15.6*  NEUTROABS 11.8*  --  13.0*  HGB 12.7 11.5* 11.2*  HCT 37.0 33.0* 33.1*  MCV 93.4 92.7 93.0  PLT 331 289 336   Cardiac Enzymes: No results for input(s): "CKTOTAL", "CKMB", "CKMBINDEX", "TROPONINI" in the last 168 hours. BNP: Invalid input(s): "POCBNP" CBG: No results for input(s): "GLUCAP" in the last 168 hours. D-Dimer No results for input(s): "DDIMER" in the last 72 hours. Hgb A1c No results for input(s): "HGBA1C" in the last 72 hours. Lipid Profile No results for input(s): "CHOL", "HDL", "LDLCALC", "TRIG", "CHOLHDL", "LDLDIRECT" in the last 72 hours. Thyroid  function studies No  results for input(s): "TSH", "T4TOTAL", "T3FREE", "THYROIDAB" in the last 72 hours.  Invalid input(s): "FREET3" Anemia work up No results for input(s): "VITAMINB12", "FOLATE", "FERRITIN", "TIBC", "IRON", "RETICCTPCT" in the last 72 hours. Urinalysis    Component Value Date/Time   COLORURINE YELLOW (A) 03/30/2020 1925   APPEARANCEUR HAZY (A) 03/30/2020 1925   LABSPEC 1.005 03/30/2020 1925   PHURINE 6.0 03/30/2020 1925   GLUCOSEU NEGATIVE 03/30/2020 1925   HGBUR MODERATE (A) 03/30/2020 1925   BILIRUBINUR NEGATIVE 03/30/2020 1925   KETONESUR NEGATIVE 03/30/2020 1925   PROTEINUR NEGATIVE 03/30/2020 1925   NITRITE POSITIVE (A) 03/30/2020 1925   LEUKOCYTESUR TRACE (A) 03/30/2020 1925   Sepsis Labs Recent Labs  Lab 01/21/24 1830 01/22/24 0224 01/26/24 0851  WBC 13.7* 7.2 15.6*   Microbiology No results found for this or any previous visit (from the past 240 hours).   Time coordinating discharge: Over 30 minutes  SIGNED:   Tiajuana Fluke, MD  [facility] 01/27/2024, 10:23 AM Pager   If  7PM-7AM, please contact night-coverage

## 2024-01-28 ENCOUNTER — Telehealth: Payer: Self-pay | Admitting: *Deleted

## 2024-01-28 NOTE — Transitions of Care (Post Inpatient/ED Visit) (Signed)
   01/28/2024  Name: Wendy Ferrell MRN: [id number] DOB: July 06, 1956  Today's TOC FU Call Status: Today's TOC FU Call Status:: Unsuccessful Call (1st Attempt) Unsuccessful Call (1st Attempt) Date: 01/28/24  Attempted to reach the patient regarding the most recent Inpatient/ED visit.  Follow Up Plan: Additional outreach attempts will be made to reach the patient to complete the Transitions of Care (Post Inpatient/ED visit) call.   Una Ganser BSN RN [facility] Health Care Management Coordinator [email or web address] Direct Dial: [phone]  Fax: [phone] Website: [email or web address]

## 2024-01-29 ENCOUNTER — Telehealth: Payer: Self-pay

## 2024-01-29 NOTE — Transitions of Care (Post Inpatient/ED Visit) (Signed)
   01/29/2024  Name: RAEDYN KLINCK MRN: [id number] DOB: [birth year]  Today's TOC FU Call Status: Today's TOC FU Call Status:: Unsuccessful Call (2nd Attempt) Unsuccessful Call (2nd Attempt) Date: 01/29/24  Attempted to reach the patient regarding the most recent Inpatient/ED visit.  Follow Up Plan: Additional outreach attempts will be made to reach the patient to complete the Transitions of Care (Post Inpatient/ED visit) call.   Rayhaan Huster J. Darius Lundberg RN, MSN [facility] RN Care Manager Direct Dial: [phone]  Fax: [phone] Website: [email or web address]

## 2024-01-30 ENCOUNTER — Telehealth: Payer: Self-pay

## 2024-01-30 NOTE — Transitions of Care (Post Inpatient/ED Visit) (Signed)
   01/30/2024  Name: Wendy Ferrell MRN: [id number] DOB: Jun 06, 1956  Today's TOC FU Call Status: Today's TOC FU Call Status:: Unsuccessful Call (3rd Attempt) Unsuccessful Call (1st Attempt) Date: 01/28/24 Unsuccessful Call (2nd Attempt) Date: 01/29/24 Unsuccessful Call (3rd Attempt) Date: 01/30/24  Attempted to reach the patient regarding the most recent Inpatient/ED visit.  Follow Up Plan: No further outreach attempts will be made at this time. We have been unable to contact the patient.  Gareld June, BSN, RN [place]  [facility] - [employer] [facility] RN Care Manager [phone]

## 2024-02-06 ENCOUNTER — Other Ambulatory Visit: Payer: Self-pay

## 2024-02-06 ENCOUNTER — Emergency Department

## 2024-02-06 ENCOUNTER — Inpatient Hospital Stay
Admission: EM | Admit: 2024-02-06 | Discharge: 2024-03-10 | DRG: 189 | Disposition: E | Attending: Internal Medicine | Admitting: Internal Medicine

## 2024-02-06 ENCOUNTER — Encounter: Payer: Self-pay | Admitting: Emergency Medicine

## 2024-02-06 DIAGNOSIS — E785 Hyperlipidemia, unspecified: Secondary | ICD-10-CM | POA: Diagnosis not present

## 2024-02-06 DIAGNOSIS — Z7902 Long term (current) use of antithrombotics/antiplatelets: Secondary | ICD-10-CM

## 2024-02-06 DIAGNOSIS — Z515 Encounter for palliative care: Secondary | ICD-10-CM

## 2024-02-06 DIAGNOSIS — E8729 Other acidosis: Secondary | ICD-10-CM | POA: Diagnosis not present

## 2024-02-06 DIAGNOSIS — I4891 Unspecified atrial fibrillation: Secondary | ICD-10-CM | POA: Diagnosis present

## 2024-02-06 DIAGNOSIS — Z7901 Long term (current) use of anticoagulants: Secondary | ICD-10-CM

## 2024-02-06 DIAGNOSIS — E43 Unspecified severe protein-calorie malnutrition: Secondary | ICD-10-CM | POA: Diagnosis present

## 2024-02-06 DIAGNOSIS — J9622 Acute and chronic respiratory failure with hypercapnia: Secondary | ICD-10-CM | POA: Diagnosis not present

## 2024-02-06 DIAGNOSIS — R000 Tachycardia, unspecified: Secondary | ICD-10-CM | POA: Diagnosis not present

## 2024-02-06 DIAGNOSIS — W19XXXA Unspecified fall, initial encounter: Secondary | ICD-10-CM | POA: Diagnosis not present

## 2024-02-06 DIAGNOSIS — R918 Other nonspecific abnormal finding of lung field: Secondary | ICD-10-CM | POA: Diagnosis not present

## 2024-02-06 DIAGNOSIS — E1151 Type 2 diabetes mellitus with diabetic peripheral angiopathy without gangrene: Secondary | ICD-10-CM | POA: Diagnosis not present

## 2024-02-06 DIAGNOSIS — Z794 Long term (current) use of insulin: Secondary | ICD-10-CM

## 2024-02-06 DIAGNOSIS — Z87891 Personal history of nicotine dependence: Secondary | ICD-10-CM

## 2024-02-06 DIAGNOSIS — Z9981 Dependence on supplemental oxygen: Secondary | ICD-10-CM | POA: Diagnosis not present

## 2024-02-06 DIAGNOSIS — D649 Anemia, unspecified: Secondary | ICD-10-CM | POA: Diagnosis present

## 2024-02-06 DIAGNOSIS — I480 Paroxysmal atrial fibrillation: Secondary | ICD-10-CM | POA: Diagnosis present

## 2024-02-06 DIAGNOSIS — Z79899 Other long term (current) drug therapy: Secondary | ICD-10-CM | POA: Diagnosis not present

## 2024-02-06 DIAGNOSIS — E8770 Fluid overload, unspecified: Secondary | ICD-10-CM | POA: Diagnosis not present

## 2024-02-06 DIAGNOSIS — I10 Essential (primary) hypertension: Secondary | ICD-10-CM | POA: Diagnosis not present

## 2024-02-06 DIAGNOSIS — D75839 Thrombocytosis, unspecified: Secondary | ICD-10-CM | POA: Diagnosis not present

## 2024-02-06 DIAGNOSIS — I959 Hypotension, unspecified: Secondary | ICD-10-CM | POA: Diagnosis not present

## 2024-02-06 DIAGNOSIS — E871 Hypo-osmolality and hyponatremia: Secondary | ICD-10-CM | POA: Diagnosis present

## 2024-02-06 DIAGNOSIS — J9621 Acute and chronic respiratory failure with hypoxia: Secondary | ICD-10-CM | POA: Diagnosis present

## 2024-02-06 DIAGNOSIS — Z90710 Acquired absence of both cervix and uterus: Secondary | ICD-10-CM

## 2024-02-06 DIAGNOSIS — J449 Chronic obstructive pulmonary disease, unspecified: Secondary | ICD-10-CM | POA: Diagnosis not present

## 2024-02-06 DIAGNOSIS — R002 Palpitations: Secondary | ICD-10-CM | POA: Diagnosis not present

## 2024-02-06 DIAGNOSIS — I2510 Atherosclerotic heart disease of native coronary artery without angina pectoris: Secondary | ICD-10-CM | POA: Diagnosis not present

## 2024-02-06 DIAGNOSIS — Z66 Do not resuscitate: Secondary | ICD-10-CM | POA: Diagnosis not present

## 2024-02-06 DIAGNOSIS — Z8249 Family history of ischemic heart disease and other diseases of the circulatory system: Secondary | ICD-10-CM

## 2024-02-06 DIAGNOSIS — R0600 Dyspnea, unspecified: Secondary | ICD-10-CM | POA: Diagnosis not present

## 2024-02-06 DIAGNOSIS — R0602 Shortness of breath: Secondary | ICD-10-CM | POA: Diagnosis not present

## 2024-02-06 DIAGNOSIS — F419 Anxiety disorder, unspecified: Secondary | ICD-10-CM | POA: Diagnosis present

## 2024-02-06 DIAGNOSIS — J9611 Chronic respiratory failure with hypoxia: Secondary | ICD-10-CM | POA: Diagnosis present

## 2024-02-06 DIAGNOSIS — J441 Chronic obstructive pulmonary disease with (acute) exacerbation: Principal | ICD-10-CM | POA: Diagnosis present

## 2024-02-06 DIAGNOSIS — R531 Weakness: Secondary | ICD-10-CM | POA: Diagnosis not present

## 2024-02-06 DIAGNOSIS — J9600 Acute respiratory failure, unspecified whether with hypoxia or hypercapnia: Secondary | ICD-10-CM | POA: Diagnosis not present

## 2024-02-06 LAB — URINE DRUG SCREEN, QUALITATIVE (ARMC ONLY)
Amphetamines, Ur Screen: NOT DETECTED
Barbiturates, Ur Screen: NOT DETECTED
Benzodiazepine, Ur Scrn: NOT DETECTED
Cannabinoid 50 Ng, Ur ~~LOC~~: NOT DETECTED
Cocaine Metabolite,Ur ~~LOC~~: NOT DETECTED
MDMA (Ecstasy)Ur Screen: NOT DETECTED
Methadone Scn, Ur: NOT DETECTED
Opiate, Ur Screen: NOT DETECTED
Phencyclidine (PCP) Ur S: NOT DETECTED
Tricyclic, Ur Screen: POSITIVE — AB

## 2024-02-06 LAB — URINALYSIS, ROUTINE W REFLEX MICROSCOPIC
Bilirubin Urine: NEGATIVE
Glucose, UA: NEGATIVE mg/dL
Ketones, ur: NEGATIVE mg/dL
Nitrite: NEGATIVE
Protein, ur: NEGATIVE mg/dL
Specific Gravity, Urine: 1.006 (ref 1.005–1.030)
Squamous Epithelial / HPF: 0 /HPF (ref 0–5)
pH: 7 (ref 5.0–8.0)

## 2024-02-06 LAB — COMPREHENSIVE METABOLIC PANEL WITH GFR
ALT: 23 U/L (ref 0–44)
AST: 27 U/L (ref 15–41)
Albumin: 3.4 g/dL — ABNORMAL LOW (ref 3.5–5.0)
Alkaline Phosphatase: 124 U/L (ref 38–126)
Anion gap: 9 (ref 5–15)
BUN: 15 mg/dL (ref 8–23)
CO2: 33 mmol/L — ABNORMAL HIGH (ref 22–32)
Calcium: 7.9 mg/dL — ABNORMAL LOW (ref 8.9–10.3)
Chloride: 88 mmol/L — ABNORMAL LOW (ref 98–111)
Creatinine, Ser: 0.65 mg/dL (ref 0.44–1.00)
GFR, Estimated: >60 mL/min (ref >60)
Glucose, Bld: 109 mg/dL — ABNORMAL HIGH (ref 70–99)
Potassium: 3.2 mmol/L — ABNORMAL LOW (ref 3.5–5.1)
Sodium: 130 mmol/L — ABNORMAL LOW (ref 135–145)
Total Bilirubin: 0.8 mg/dL (ref 0.0–1.2)
Total Protein: 6.1 g/dL — ABNORMAL LOW (ref 6.5–8.1)

## 2024-02-06 LAB — BLOOD GAS, VENOUS
Acid-Base Excess: 6.5 mmol/L — ABNORMAL HIGH (ref 0.0–2.0)
Bicarbonate: 34.2 mmol/L — ABNORMAL HIGH (ref 20.0–28.0)
O2 Saturation: 59.4 %
Patient temperature: 37
pCO2, Ven: 62 mmHg — ABNORMAL HIGH (ref 44–60)
pH, Ven: 7.35 (ref 7.25–7.43)
pO2, Ven: 37 mmHg (ref 32–45)

## 2024-02-06 LAB — TSH: TSH: 0.462 u[IU]/mL (ref 0.350–4.500)

## 2024-02-06 LAB — CBC
HCT: 30.2 % — ABNORMAL LOW (ref 36.0–46.0)
Hemoglobin: 10.4 g/dL — ABNORMAL LOW (ref 12.0–15.0)
MCH: 32.1 pg (ref 26.0–34.0)
MCHC: 34.4 g/dL (ref 30.0–36.0)
MCV: 93.2 fL (ref 80.0–100.0)
Platelets: 494 10*3/uL — ABNORMAL HIGH (ref 150–400)
RBC: 3.24 MIL/uL — ABNORMAL LOW (ref 3.87–5.11)
RDW: 14.5 % (ref 11.5–15.5)
WBC: 16.3 10*3/uL — ABNORMAL HIGH (ref 4.0–10.5)
nRBC: 0 % (ref 0.0–0.2)

## 2024-02-06 LAB — RESP PANEL BY RT-PCR (RSV, FLU A&B, COVID)  RVPGX2
Influenza A by PCR: NEGATIVE
Influenza B by PCR: NEGATIVE
Resp Syncytial Virus by PCR: NEGATIVE
SARS Coronavirus 2 by RT PCR: NEGATIVE

## 2024-02-06 LAB — PROTIME-INR
INR: 1.1 (ref 0.8–1.2)
Prothrombin Time: 14.6 s (ref 11.4–15.2)

## 2024-02-06 LAB — MAGNESIUM: Magnesium: 1.8 mg/dL (ref 1.7–2.4)

## 2024-02-06 LAB — PHOSPHORUS: Phosphorus: 3 mg/dL (ref 2.5–4.6)

## 2024-02-06 LAB — LACTIC ACID, PLASMA: Lactic Acid, Venous: 1.4 mmol/L (ref 0.5–1.9)

## 2024-02-06 MED ORDER — SODIUM CHLORIDE 0.9 % IV SOLN
500.0000 mg | Freq: Once | INTRAVENOUS | Status: AC
  Administered 2024-02-06: 500 mg via INTRAVENOUS
  Filled 2024-02-06: qty 5

## 2024-02-06 MED ORDER — ACETAMINOPHEN 500 MG PO TABS
1000.0000 mg | ORAL_TABLET | Freq: Four times a day (QID) | ORAL | Status: DC | PRN
  Administered 2024-02-07 – 2024-02-08 (×5): 1000 mg via ORAL
  Filled 2024-02-06 (×6): qty 2

## 2024-02-06 MED ORDER — DILTIAZEM HCL-DEXTROSE 125-5 MG/125ML-% IV SOLN (PREMIX)
5.0000 mg/h | INTRAVENOUS | Status: DC
  Administered 2024-02-06: 5 mg/h via INTRAVENOUS
  Administered 2024-02-07: 10 mg/h via INTRAVENOUS
  Administered 2024-02-07: 15 mg/h via INTRAVENOUS
  Administered 2024-02-08: 12.5 mg/h via INTRAVENOUS
  Administered 2024-02-08: 15 mg/h via INTRAVENOUS
  Administered 2024-02-09: 5 mg/h via INTRAVENOUS
  Filled 2024-02-06 (×6): qty 125

## 2024-02-06 MED ORDER — APIXABAN 5 MG PO TABS
5.0000 mg | ORAL_TABLET | Freq: Two times a day (BID) | ORAL | Status: DC
  Administered 2024-02-06 – 2024-02-09 (×6): 5 mg via ORAL
  Filled 2024-02-06 (×7): qty 1

## 2024-02-06 MED ORDER — ONDANSETRON HCL 4 MG PO TABS: 4.0000 mg | ORAL_TABLET | Freq: Four times a day (QID) | ORAL | Status: DC | PRN

## 2024-02-06 MED ORDER — DILTIAZEM HCL 25 MG/5ML IV SOLN
10.0000 mg | Freq: Once | INTRAVENOUS | Status: AC
  Administered 2024-02-06: 10 mg via INTRAVENOUS
  Filled 2024-02-06: qty 5

## 2024-02-06 MED ORDER — POTASSIUM CHLORIDE CRYS ER 20 MEQ PO TBCR
40.0000 meq | EXTENDED_RELEASE_TABLET | Freq: Once | ORAL | Status: AC
  Administered 2024-02-06: 40 meq via ORAL
  Filled 2024-02-06: qty 2

## 2024-02-06 MED ORDER — LACTATED RINGERS IV BOLUS (SEPSIS)
1000.0000 mL | Freq: Once | INTRAVENOUS | Status: AC
  Administered 2024-02-06: 1000 mL via INTRAVENOUS

## 2024-02-06 MED ORDER — LACTATED RINGERS IV SOLN: INTRAVENOUS | Status: DC

## 2024-02-06 MED ORDER — METHYLPREDNISOLONE SODIUM SUCC 40 MG IJ SOLR
40.0000 mg | Freq: Two times a day (BID) | INTRAMUSCULAR | Status: DC
  Administered 2024-02-06 – 2024-02-09 (×7): 40 mg via INTRAVENOUS
  Filled 2024-02-06 (×7): qty 1

## 2024-02-06 MED ORDER — LOSARTAN POTASSIUM 25 MG PO TABS
25.0000 mg | ORAL_TABLET | Freq: Every day | ORAL | Status: DC
  Administered 2024-02-06 – 2024-02-08 (×3): 25 mg via ORAL
  Filled 2024-02-06 (×3): qty 1

## 2024-02-06 MED ORDER — BUDESON-GLYCOPYRROL-FORMOTEROL 160-9-4.8 MCG/ACT IN AERO
2.0000 | INHALATION_SPRAY | Freq: Two times a day (BID) | RESPIRATORY_TRACT | Status: DC
  Administered 2024-02-06 – 2024-02-09 (×7): 2 via RESPIRATORY_TRACT
  Filled 2024-02-06: qty 5.9

## 2024-02-06 MED ORDER — CLOPIDOGREL BISULFATE 75 MG PO TABS
75.0000 mg | ORAL_TABLET | Freq: Every day | ORAL | Status: DC
  Administered 2024-02-06 – 2024-02-09 (×4): 75 mg via ORAL
  Filled 2024-02-06 (×4): qty 1

## 2024-02-06 MED ORDER — METHYLPREDNISOLONE SODIUM SUCC 125 MG IJ SOLR
125.0000 mg | Freq: Once | INTRAMUSCULAR | Status: AC
  Administered 2024-02-06: 125 mg via INTRAVENOUS
  Filled 2024-02-06: qty 2

## 2024-02-06 MED ORDER — OXYCODONE HCL 5 MG PO TABS
5.0000 mg | ORAL_TABLET | ORAL | Status: DC | PRN
  Administered 2024-02-06 – 2024-02-07 (×2): 10 mg via ORAL
  Administered 2024-02-07 (×2): 5 mg via ORAL
  Administered 2024-02-07 – 2024-02-09 (×7): 10 mg via ORAL
  Filled 2024-02-06: qty 1
  Filled 2024-02-06 (×2): qty 2
  Filled 2024-02-06: qty 1
  Filled 2024-02-06 (×7): qty 2

## 2024-02-06 MED ORDER — DOXYCYCLINE HYCLATE 100 MG PO TABS
100.0000 mg | ORAL_TABLET | Freq: Two times a day (BID) | ORAL | Status: DC
  Administered 2024-02-07: 100 mg via ORAL
  Filled 2024-02-06: qty 1

## 2024-02-06 MED ORDER — LACTATED RINGERS IV BOLUS (SEPSIS)
500.0000 mL | Freq: Once | INTRAVENOUS | Status: AC
  Administered 2024-02-06: 500 mL via INTRAVENOUS

## 2024-02-06 MED ORDER — IPRATROPIUM-ALBUTEROL 0.5-2.5 (3) MG/3ML IN SOLN
3.0000 mL | Freq: Once | RESPIRATORY_TRACT | Status: AC
  Administered 2024-02-06: 3 mL via RESPIRATORY_TRACT
  Filled 2024-02-06: qty 3

## 2024-02-06 MED ORDER — LORATADINE 10 MG PO TABS
10.0000 mg | ORAL_TABLET | Freq: Every day | ORAL | Status: DC
  Administered 2024-02-06 – 2024-02-09 (×4): 10 mg via ORAL
  Filled 2024-02-06 (×4): qty 1

## 2024-02-06 MED ORDER — IPRATROPIUM-ALBUTEROL 0.5-2.5 (3) MG/3ML IN SOLN
3.0000 mL | Freq: Four times a day (QID) | RESPIRATORY_TRACT | Status: DC
  Administered 2024-02-06 (×2): 3 mL via RESPIRATORY_TRACT
  Filled 2024-02-06 (×2): qty 3

## 2024-02-06 MED ORDER — ATORVASTATIN CALCIUM 20 MG PO TABS
40.0000 mg | ORAL_TABLET | Freq: Every day | ORAL | Status: DC
  Administered 2024-02-06 – 2024-02-09 (×4): 40 mg via ORAL
  Filled 2024-02-06 (×4): qty 2

## 2024-02-06 MED ORDER — SODIUM CHLORIDE 0.9 % IV SOLN
2.0000 g | Freq: Once | INTRAVENOUS | Status: AC
  Administered 2024-02-06: 2 g via INTRAVENOUS
  Filled 2024-02-06: qty 20

## 2024-02-06 MED ORDER — FLUTICASONE PROPIONATE 50 MCG/ACT NA SUSP
1.0000 | Freq: Every day | NASAL | Status: DC | PRN
  Administered 2024-02-08: 1 via NASAL
  Filled 2024-02-06: qty 16

## 2024-02-06 MED ORDER — ONDANSETRON HCL 4 MG/2ML IJ SOLN
4.0000 mg | Freq: Four times a day (QID) | INTRAMUSCULAR | Status: DC | PRN
  Administered 2024-02-09: 4 mg via INTRAVENOUS
  Filled 2024-02-06: qty 2

## 2024-02-06 MED ORDER — GUAIFENESIN ER 600 MG PO TB12
1200.0000 mg | ORAL_TABLET | Freq: Two times a day (BID) | ORAL | Status: DC
  Administered 2024-02-06 – 2024-02-09 (×7): 1200 mg via ORAL
  Filled 2024-02-06 (×8): qty 2

## 2024-02-06 NOTE — Progress Notes (Signed)
 CODE SEPSIS - PHARMACY COMMUNICATION  **Broad Spectrum Antibiotics should be administered within 1 hour of Sepsis diagnosis**  Time Code Sepsis Called/Page Received: 1243  Antibiotics Ordered: azithromycin  and ceftriaxone   Time of 1st antibiotic administration: 1318  Additional action taken by pharmacy: None  If necessary, Name of Provider/Nurse Contacted: None    Ananias Balls ,PharmD Clinical Pharmacist  02/06/2024  12:48 PM

## 2024-02-06 NOTE — ED Notes (Signed)
RT called for Bipap at this time.

## 2024-02-06 NOTE — ED Triage Notes (Signed)
 Patient to ED via [facility] from home for generalized weakness. Recently seen for fx pelvis approx 3 weeks ago. HR in the 160's with EMS- hx of afib. Given 10 mg of Cardizem and 500mL NacL with EMS. Wears 4L [place] at baseline- 100%.

## 2024-02-06 NOTE — H&P (Signed)
 History and Physical    Wendy Ferrell OZD:[id number] DOB: May 28, 1956 DOA: 02/06/2024  PCP: Shari Daughters, MD (Confirm with patient/family/NH records and if not entered, this has to be entered at [facility] point of entry) Patient coming from: Home  I have personally briefly reviewed patient's old medical records in [facility]  Chief Complaint: Cough wheezing shortness of breath palpitations  HPI: Wendy Ferrell is a 68 y.o. 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  Patient was recently hospitalized for COPD exacerbation and discharged home on p.o. steroid, which she completed about 5 days ago.  Initially she felt better however about 4 to 5 days ago, she started to have productive cough and wheezing again.  She has been using around-the-clock Combivent  with little help.  This morning she started develop severe shortness of breath and strong palpitations, she tried to turn up the oxygen  from 4 to 5 L but without any relief of her breathing symptoms.  She also reported episodes of subjective fever and chills.  No nauseous vomiting no diarrhea.  Denied any chest pains.  ED Course: Afebrile, tachycardia and A-fib heart rate in the 140-150s, blood pressure 150/100 tachypneic breathing rate 28-30 O2 saturation 96% on 4 to 5 L.  Chest x-ray showed no acute infiltrates.  Blood work showed sodium 130 potassium 3.2, BUN 15 creatinine 0.6 WBC 16 hemoglobin 10.4 COVID-negative.  Patient was given IV Solu-Medrol , DuoNebs, Cardizem 10 mg IV push x 1 and 1 dose of 500 mg azithromycin  and ceftriaxone  in the ED.  Review of Systems: As per HPI otherwise 14 point review of systems negative.    Past Medical History:  Diagnosis Date   COPD (chronic obstructive pulmonary disease) (HCC)    Hypertension     Nicotine  addiction    Oxygen  dependent    Peripheral vascular disease (HCC)     Past Surgical History:  Procedure Laterality Date   ABDOMINAL HYSTERECTOMY     COLONOSCOPY WITH PROPOFOL  N/A 02/21/2016   Procedure: COLONOSCOPY WITH PROPOFOL ;  Surgeon: Deveron Fly, MD;  Location: [facility] ENDOSCOPY;  Service: Endoscopy;  Laterality: N/A;   LOWER EXTREMITY ANGIOGRAPHY Right 05/28/2017   Procedure: Lower Extremity Angiography;  Surgeon: Jackquelyn Mass, MD;  Location: [facility] INVASIVE CV LAB;  Service: Cardiovascular;  Laterality: Right;   MASTOIDECTOMY     NECK SURGERY     PERIPHERAL VASCULAR CATHETERIZATION Left 05/29/2016   Procedure: Lower Extremity Angiography;  Surgeon: Jackquelyn Mass, MD;  Location: [facility] INVASIVE CV LAB;  Service: Cardiovascular;  Laterality: Left;   PERIPHERAL VASCULAR CATHETERIZATION  05/29/2016   Procedure: Lower Extremity Intervention;  Surgeon: Jackquelyn Mass, MD;  Location: [facility] INVASIVE CV LAB;  Service: Cardiovascular;;   TONSILLECTOMY       reports that she has quit smoking. Her smoking use included cigarettes. She has a 22.5 pack-year smoking history. She has never used smokeless tobacco. She reports that she does not drink alcohol and does not use drugs.  No Known Allergies  Family History  Problem Relation Age of Onset   COPD Mother    Hypertension Mother    COPD Father    Breast cancer Maternal Grandmother 86   Breast cancer Cousin 50       maternal side    Prior to Admission medications   Medication Sig Start Date End  Date Taking? Authorizing Provider  atorvastatin  (LIPITOR) 40 MG tablet Take 1 tablet (40 mg total) by mouth daily. 11/08/23   Shari Daughters, MD  cetirizine  (ZYRTEC ) 10 MG tablet TAKE 1 TABLET BY MOUTH EVERY DAY 09/20/23   Tejan-Sie, S Ahmed, MD  chlorthalidone (HYGROTON) 25 MG tablet TAKE 1/2 TABLET BY MOUTH DAILY AS DIRECTED 10/16/23   Shari Daughters, MD  clopidogrel  (PLAVIX ) 75 MG tablet Take 1 tablet (75 mg total) by  mouth daily. 01/13/24   Shari Daughters, MD  ELIQUIS  5 MG TABS tablet TAKE 1 TABLET BY MOUTH TWICE A DAY 01/22/24   Scoggins, Hospital doctor, NP  fluticasone  (FLONASE ) 50 MCG/ACT nasal spray Place 1 spray into both nostrils daily. 11/08/23 02/06/24  Shari Daughters, MD  Fluticasone -Umeclidin-Vilant (TRELEGY ELLIPTA ) 100-62.5-25 MCG/ACT AEPB USE AS DIRECTED 1 INHALATION PER DAY 06/11/23   Tejan-Sie, S Ahmed, MD  Ipratropium-Albuterol  (COMBIVENT  RESPIMAT) 20-100 MCG/ACT AERS respimat Inhale 1 puff into the lungs every 6 (six) hours as needed for wheezing. 11/08/23 05/06/24  Shari Daughters, MD  losartan  (COZAAR ) 25 MG tablet TAKE 1 TABLET BY MOUTH EVERY DAY 10/15/23   Shari Daughters, MD  oxyCODONE  (OXY IR/ROXICODONE ) 5 MG immediate release tablet Take 1-2 tablets (5-10 mg total) by mouth every 4 (four) hours as needed for moderate pain (pain score 4-6) or severe pain (pain score 7-10). 01/27/24   Tiajuana Fluke, MD  OXYGEN  Inhale into the lungs.    [provider]    Physical Exam: Vitals:   02/06/24 1330 02/06/24 1400 02/06/24 1415 02/06/24 1500  BP: (!) 151/110 (!) 134/101  (!) 183/111  Pulse: (!) 155 (!) 118 (!) 144 100  Resp: (!) 22 (!) 36 (!) 21 (!) 25  Temp:      TempSrc:      SpO2: 100% 96% 100% 97%  Weight:      Height:        Constitutional: NAD, calm, comfortable Vitals:   02/06/24 1330 02/06/24 1400 02/06/24 1415 02/06/24 1500  BP: (!) 151/110 (!) 134/101  (!) 183/111  Pulse: (!) 155 (!) 118 (!) 144 100  Resp: (!) 22 (!) 36 (!) 21 (!) 25  Temp:      TempSrc:      SpO2: 100% 96% 100% 97%  Weight:      Height:       Eyes: PERRL, lids and conjunctivae normal ENMT: Mucous membranes are moist. Posterior pharynx clear of any exudate or lesions.Normal dentition.  Neck: normal, supple, no masses, no thyromegaly Respiratory: Diminished breathing sound bilaterally, scattered wheezing no crackles, increasing breathing effort, positive signs of accessory muscle use.   Cardiovascular: Irregular heart rate tachycardia, no murmurs / rubs / gallops. No extremity edema. 2+ pedal pulses. No carotid bruits.  Abdomen: no tenderness, no masses palpated. No hepatosplenomegaly. Bowel sounds positive.  Musculoskeletal: no clubbing / cyanosis. No joint deformity upper and lower extremities. Good ROM, no contractures. Normal muscle tone.  Skin: no rashes, lesions, ulcers. No induration Neurologic: CN 2-12 grossly intact. Sensation intact, DTR normal. Strength 5/5 in all 4.  Psychiatric: Normal judgment and insight. Alert and oriented x 3. Normal mood.     Labs on Admission: I have personally reviewed following labs and imaging studies  CBC: Recent Labs  Lab 02/06/24 1144  WBC 16.3*  HGB 10.4*  HCT 30.2*  MCV 93.2  PLT 494*   Basic Metabolic Panel: Recent Labs  Lab 02/06/24 1144  NA 130*  K 3.2*  CL  88*  CO2 33*  GLUCOSE 109*  BUN 15  CREATININE 0.65  CALCIUM  7.9*   GFR: Estimated Creatinine Clearance: 49.6 mL/min (by C-G formula based on SCr of 0.65 mg/dL). Liver Function Tests: Recent Labs  Lab 02/06/24 1144  AST 27  ALT 23  ALKPHOS 124  BILITOT 0.8  PROT 6.1*  ALBUMIN 3.4*   No results for input(s): "LIPASE", "AMYLASE" in the last 168 hours. No results for input(s): "AMMONIA" in the last 168 hours. Coagulation Profile: Recent Labs  Lab 02/06/24 1300  INR 1.1   Cardiac Enzymes: No results for input(s): "CKTOTAL", "CKMB", "CKMBINDEX", "TROPONINI" in the last 168 hours. BNP (last 3 results) No results for input(s): "PROBNP" in the last 8760 hours. HbA1C: No results for input(s): "HGBA1C" in the last 72 hours. CBG: No results for input(s): "GLUCAP" in the last 168 hours. Lipid Profile: No results for input(s): "CHOL", "HDL", "LDLCALC", "TRIG", "CHOLHDL", "LDLDIRECT" in the last 72 hours. Thyroid  Function Tests: No results for input(s): "TSH", "T4TOTAL", "FREET4", "T3FREE", "THYROIDAB" in the last 72 hours. Anemia Panel: No  results for input(s): "VITAMINB12", "FOLATE", "FERRITIN", "TIBC", "IRON", "RETICCTPCT" in the last 72 hours. Urine analysis:    Component Value Date/Time   COLORURINE YELLOW (A) 03/30/2020 1925   APPEARANCEUR HAZY (A) 03/30/2020 1925   LABSPEC 1.005 03/30/2020 1925   PHURINE 6.0 03/30/2020 1925   GLUCOSEU NEGATIVE 03/30/2020 1925   HGBUR MODERATE (A) 03/30/2020 1925   BILIRUBINUR NEGATIVE 03/30/2020 1925   KETONESUR NEGATIVE 03/30/2020 1925   PROTEINUR NEGATIVE 03/30/2020 1925   NITRITE POSITIVE (A) 03/30/2020 1925   LEUKOCYTESUR TRACE (A) 03/30/2020 1925    Radiological Exams on Admission: DG Chest Port 1 View Result Date: 02/06/2024 CLINICAL DATA:  Shortness of breath EXAM: PORTABLE CHEST 1 VIEW COMPARISON:  Chest x-ray performed Jan 24, 2024 FINDINGS: Lungs are hyperexpanded. No significant effusion or pneumothorax. The previously observed left perihilar nodule is not clearly seen on the current exam. Several leads overlie the patient. Underlying changes osteopenia. IMPRESSION: 1. Hyperexpansion of the lungs which can be observed in the setting of COPD. Electronically Signed   By: Reagan Camera M.D.   On: 02/06/2024 14:44    EKG: Independently reviewed.  A-fib with RVR, no acute ST changes.  Assessment/Plan Principal Problem:   Afib (HCC) Active Problems:   Chronic obstructive pulmonary disease with (acute) exacerbation (HCC)   Chronic respiratory failure with hypoxia (HCC)   COPD exacerbation (HCC)   A-fib (HCC)  (please populate well all problems here in Problem List. (For example, if patient is on BP meds at home and you resume or decide to hold them, it is a problem that needs to be her. Same for CAD, COPD, HLD and so on)  Acute on chronic hypoxic and hypercapnic respiratory failure Acute COPD exacerbation - Continue IV Solu-Medrol  - Continue ICS and LABA - DuoNebs and as needed albuterol  -Doxycycline  - Ordered BiPAP to relieve breathing effort - Guaifenesin , incentive  spirometry  A-fib with RVR - New onset of A-fib probably secondary to COPD exacerbation - Start Cardizem drip for rate control - CHADS2=1, patient already on Eliquis  for PVD - Echocardiogram done 6 months ago showed normal LVEF. - Check TSH and UDS  HTN - Hold off chlorthalidone as patient on Cardizem drip - Continue ARB  Nicotine  dependence - Patient claimed that she already quit smoking  PAD - No acute concern, continue Eliquis  and Plavix   DVT prophylaxis: Eliquis  Code Status: Full code Family Communication: None at bedside Disposition  Plan: Patient is sick with severe COPD exacerbation requiring BiPAP and new onset A-fib requiring IV rate control medication, expect more than 2 midnight hospital stay Consults called: None  Admission status: PCU admit   [place] MD [facility] Pager [phone] 02/06/2024, 3:05 PM

## 2024-02-06 NOTE — Progress Notes (Signed)
 pt is c/o 8/10 bil leg pain, pt received oxycodone  around 1948, pain was just sudden. Pt has oxycodone  q4 prn. Pt came in for COPD exacerbation and AFRVR, and was here this month for left pubic bone fracture. Notify provider if pt can also have PRN tylenol  for pain. Awaiting for response. No other concern at the moment. Plan of care continued.

## 2024-02-06 NOTE — ED Provider Notes (Signed)
 [facility] Provider Note   Event Date/Time   First MD Initiated Contact with Patient 02/06/24 1134     (approximate) History  Weakness  HPI Wendy Ferrell is a 68 y.o. female with a past medical history of CAD, COPD on 4 L nasal cannula chronically, hypertension who presents complaining of worsening generalized weakness, shortness of breath, and palpitations.  EMS noted patient to be in A-fib and gave 10 mg of Cardizem and 500 cc of fluid prior to arrival. ROS: Patient currently denies any vision changes, tinnitus, difficulty speaking, facial droop, sore throat, chest pain, abdominal pain, nausea/vomiting/diarrhea, dysuria, or weakness/numbness/paresthesias in any extremity   Physical Exam  Triage Vital Signs: ED Triage Vitals  Encounter Vitals Group     BP 02/06/24 1140 (!) 153/106     Systolic BP Percentile --      Diastolic BP Percentile --      Pulse Rate 02/06/24 1140 74     Resp 02/06/24 1140 20     Temp 02/06/24 1140 98.2 F (36.8 C)     Temp Source 02/06/24 1140 Oral     SpO2 02/06/24 1139 100 %     Weight 02/06/24 1141 100 lb (45.4 kg)     Height 02/06/24 1141 5\' 4"  (1.626 m)     Head Circumference --      Peak Flow --      Pain Score 02/06/24 1141 10     Pain Loc --      Pain Education --      Exclude from Growth Chart --    Most recent vital signs: Vitals:   02/06/24 1415 02/06/24 1500  BP:  (!) 183/111  Pulse: (!) 144 100  Resp: (!) 21 (!) 25  Temp:    SpO2: 100% 97%   General: Awake, oriented x4. CV:  Good peripheral perfusion. Resp:  Increased effort.  Inspiratory and expiratory wheezing over bilateral lung fields Abd:  No distention. Other:   Resting comfortably in no acute distress ED Results / Procedures / Treatments  Labs (all labs ordered are listed, but only abnormal results are displayed) Labs Reviewed  COMPREHENSIVE METABOLIC PANEL WITH GFR - Abnormal; Notable for the following components:      Result Value    Sodium 130 (*)    Potassium 3.2 (*)    Chloride 88 (*)    CO2 33 (*)    Glucose, Bld 109 (*)    Calcium  7.9 (*)    Total Protein 6.1 (*)    Albumin 3.4 (*)    All other components within normal limits  CBC - Abnormal; Notable for the following components:   WBC 16.3 (*)    RBC 3.24 (*)    Hemoglobin 10.4 (*)    HCT 30.2 (*)    Platelets 494 (*)    All other components within normal limits  BLOOD GAS, VENOUS - Abnormal; Notable for the following components:   pCO2, Ven 62 (*)    Bicarbonate 34.2 (*)    Acid-Base Excess 6.5 (*)    All other components within normal limits  RESP PANEL BY RT-PCR (RSV, FLU A&B, COVID)  RVPGX2  CULTURE, BLOOD (ROUTINE X 2)  CULTURE, BLOOD (ROUTINE X 2)  LACTIC ACID, PLASMA  PROTIME-INR  URINALYSIS, ROUTINE W REFLEX MICROSCOPIC  MAGNESIUM   PHOSPHORUS  TSH  URINE DRUG SCREEN, QUALITATIVE ([facility] ONLY)  CBG MONITORING, ED   EKG ED ECG REPORT I, Charleen Conn, the attending physician, personally  viewed and interpreted this ECG. Date: 02/06/2024 EKG Time: 1143 Rate: 156 Rhythm: Atrial fibrillation with rapid ventricular response QRS Axis: normal Intervals: normal ST/T Wave abnormalities: normal Narrative Interpretation: A-fib with RVR.  No evidence of acute ischemia RADIOLOGY ED MD interpretation: One-view portable chest x-ray interpreted by me shows no evidence of acute abnormalities including no pneumonia, pneumothorax, or widened mediastinum - All radiology independently interpreted and agree with radiology assessment Official radiology report(s): DG Chest Port 1 View Result Date: 02/06/2024 CLINICAL DATA:  Shortness of breath EXAM: PORTABLE CHEST 1 VIEW COMPARISON:  Chest x-ray performed Jan 24, 2024 FINDINGS: Lungs are hyperexpanded. No significant effusion or pneumothorax. The previously observed left perihilar nodule is not clearly seen on the current exam. Several leads overlie the patient. Underlying changes osteopenia. IMPRESSION: 1.  Hyperexpansion of the lungs which can be observed in the setting of COPD. Electronically Signed   By: Reagan Camera M.D.   On: 02/06/2024 14:44   PROCEDURES: Critical Care performed: Yes, see critical care procedure note(s) .1-3 Lead EKG Interpretation  Performed by: Charleen Conn, MD Authorized by: Charleen Conn, MD     Interpretation: abnormal     ECG rate:  121   ECG rate assessment: tachycardic     Rhythm: atrial fibrillation     Ectopy: none     Conduction: normal   CRITICAL CARE Performed by: Karlin Binion K Linford Quintela  Total critical care time: 33 minutes  Critical care time was exclusive of separately billable procedures and treating other patients.  Critical care was necessary to treat or prevent imminent or life-threatening deterioration.  Critical care was time spent personally by me on the following activities: development of treatment plan with patient and/or surrogate as well as nursing, discussions with consultants, evaluation of patient's response to treatment, examination of patient, obtaining history from patient or surrogate, ordering and performing treatments and interventions, ordering and review of laboratory studies, ordering and review of radiographic studies, pulse oximetry and re-evaluation of patient's condition.  MEDICATIONS ORDERED IN ED: Medications  lactated ringers  infusion ( Intravenous New Bag/Given 02/06/24 1401)  diltiazem (CARDIZEM) 125 mg in dextrose  5% 125 mL (1 mg/mL) infusion (5 mg/hr Intravenous New Bag/Given 02/06/24 1505)  oxyCODONE  (Oxy IR/ROXICODONE ) immediate release tablet 5-10 mg (has no administration in time range)  atorvastatin  (LIPITOR) tablet 40 mg (has no administration in time range)  losartan  (COZAAR ) tablet 25 mg (has no administration in time range)  clopidogrel  (PLAVIX ) tablet 75 mg (has no administration in time range)  apixaban  (ELIQUIS ) tablet 5 mg (has no administration in time range)  loratadine (CLARITIN) tablet 10 mg (has no  administration in time range)  fluticasone  (FLONASE ) 50 MCG/ACT nasal spray 1 spray (has no administration in time range)  budesonide -glycopyrrolate -formoterol  (BREZTRI ) 160-9-4.8 MCG/ACT inhaler 2 puff (has no administration in time range)  ipratropium-albuterol  (DUONEB) 0.5-2.5 (3) MG/3ML nebulizer solution 3 mL (3 mLs Nebulization Given 02/06/24 1517)  methylPREDNISolone  sodium succinate (SOLU-MEDROL ) 40 mg/mL injection 40 mg (has no administration in time range)  ondansetron  (ZOFRAN ) tablet 4 mg (has no administration in time range)    Or  ondansetron  (ZOFRAN ) injection 4 mg (has no administration in time range)  doxycycline  (VIBRA -TABS) tablet 100 mg (has no administration in time range)  guaiFENesin  (MUCINEX ) 12 hr tablet 1,200 mg (has no administration in time range)  potassium chloride  SA (KLOR-CON  M) CR tablet 40 mEq (has no administration in time range)  methylPREDNISolone  sodium succinate (SOLU-MEDROL ) 125 mg/2 mL injection 125 mg (125 mg Intravenous  Given 02/06/24 1316)  diltiazem (CARDIZEM) injection 10 mg (10 mg Intravenous Given 02/06/24 1313)  ipratropium-albuterol  (DUONEB) 0.5-2.5 (3) MG/3ML nebulizer solution 3 mL (3 mLs Nebulization Given 02/06/24 1320)  lactated ringers  bolus 1,000 mL (0 mLs Intravenous Stopped 02/06/24 1502)    And  lactated ringers  bolus 500 mL (0 mLs Intravenous Stopped 02/06/24 1401)  cefTRIAXone  (ROCEPHIN ) 2 g in sodium chloride  0.9 % 100 mL IVPB (0 g Intravenous Stopped 02/06/24 1401)  azithromycin  (ZITHROMAX ) 500 mg in sodium chloride  0.9 % 250 mL IVPB (0 mg Intravenous Stopped 02/06/24 1511)   IMPRESSION / MDM / ASSESSMENT AND PLAN / ED COURSE  I reviewed the triage vital signs and the nursing notes.                             The patient is on the cardiac monitor to evaluate for evidence of arrhythmia and/or significant heart rate changes. Patient's presentation is most consistent with acute presentation with potential threat to life or bodily  function. The patient appears to be suffering from a moderate/severe exacerbation of COPD.  Based on the history, exam, CXR/EKG reviewed by me, and further workup I don't suspect any other emergent cause of this presentation, such as pneumonia, acute coronary syndrome, congestive heart failure, pulmonary embolism, or pneumothorax.  ED Interventions: bronchodilators, steroids, antibiotics, reassess  Reassessment: After treatment, the patient's shortness of breath is improving but patient is still requiring supplemental oxygenation   Disposition: Admit   FINAL CLINICAL IMPRESSION(S) / ED DIAGNOSES   Final diagnoses:  COPD exacerbation (HCC)  Atrial fibrillation with rapid ventricular response (HCC)   Rx / DC Orders   ED Discharge Orders     None      Note:  This document was prepared using Dragon voice recognition software and may include unintentional dictation errors.   Charleen Conn, MD 02/06/24 [phone]

## 2024-02-06 NOTE — Sepsis Progress Note (Signed)
 Elink monitoring for the code sepsis protocol.

## 2024-02-07 DIAGNOSIS — J441 Chronic obstructive pulmonary disease with (acute) exacerbation: Secondary | ICD-10-CM | POA: Diagnosis not present

## 2024-02-07 DIAGNOSIS — I4891 Unspecified atrial fibrillation: Secondary | ICD-10-CM

## 2024-02-07 LAB — CBC
HCT: 28.5 % — ABNORMAL LOW (ref 36.0–46.0)
Hemoglobin: 9.7 g/dL — ABNORMAL LOW (ref 12.0–15.0)
MCH: 31.7 pg (ref 26.0–34.0)
MCHC: 34 g/dL (ref 30.0–36.0)
MCV: 93.1 fL (ref 80.0–100.0)
Platelets: 480 10*3/uL — ABNORMAL HIGH (ref 150–400)
RBC: 3.06 MIL/uL — ABNORMAL LOW (ref 3.87–5.11)
RDW: 14.5 % (ref 11.5–15.5)
WBC: 13.8 10*3/uL — ABNORMAL HIGH (ref 4.0–10.5)
nRBC: 0 % (ref 0.0–0.2)

## 2024-02-07 LAB — BASIC METABOLIC PANEL WITH GFR
Anion gap: 8 (ref 5–15)
BUN: 16 mg/dL (ref 8–23)
CO2: 32 mmol/L (ref 22–32)
Calcium: 8.6 mg/dL — ABNORMAL LOW (ref 8.9–10.3)
Chloride: 90 mmol/L — ABNORMAL LOW (ref 98–111)
Creatinine, Ser: 0.66 mg/dL (ref 0.44–1.00)
GFR, Estimated: >60 mL/min (ref >60)
Glucose, Bld: 185 mg/dL — ABNORMAL HIGH (ref 70–99)
Potassium: 4.2 mmol/L (ref 3.5–5.1)
Sodium: 130 mmol/L — ABNORMAL LOW (ref 135–145)

## 2024-02-07 MED ORDER — IPRATROPIUM-ALBUTEROL 0.5-2.5 (3) MG/3ML IN SOLN
3.0000 mL | Freq: Three times a day (TID) | RESPIRATORY_TRACT | Status: DC
  Administered 2024-02-07 – 2024-02-09 (×8): 3 mL via RESPIRATORY_TRACT
  Filled 2024-02-07 (×9): qty 3

## 2024-02-07 MED ORDER — DOCUSATE SODIUM 100 MG PO CAPS
200.0000 mg | ORAL_CAPSULE | Freq: Two times a day (BID) | ORAL | Status: DC
  Administered 2024-02-08 – 2024-02-09 (×3): 200 mg via ORAL
  Filled 2024-02-07 (×5): qty 2

## 2024-02-07 MED ORDER — POLYETHYLENE GLYCOL 3350 17 G PO PACK
17.0000 g | PACK | Freq: Every day | ORAL | Status: DC
  Administered 2024-02-07 – 2024-02-09 (×3): 17 g via ORAL
  Filled 2024-02-07 (×3): qty 1

## 2024-02-07 MED ORDER — PREGABALIN 50 MG PO CAPS
50.0000 mg | ORAL_CAPSULE | Freq: Two times a day (BID) | ORAL | Status: DC
  Administered 2024-02-07 – 2024-02-09 (×5): 50 mg via ORAL
  Filled 2024-02-07 (×5): qty 1

## 2024-02-07 MED ORDER — SODIUM CHLORIDE 0.9 % IV SOLN
100.0000 mg | Freq: Two times a day (BID) | INTRAVENOUS | Status: DC
  Administered 2024-02-07 – 2024-02-09 (×5): 100 mg via INTRAVENOUS
  Filled 2024-02-07 (×9): qty 100

## 2024-02-07 MED ORDER — AMIODARONE IV BOLUS ONLY 150 MG/100ML
150.0000 mg | Freq: Once | INTRAVENOUS | Status: AC
  Administered 2024-02-07: 150 mg via INTRAVENOUS
  Filled 2024-02-07: qty 100

## 2024-02-07 MED ORDER — SALINE SPRAY 0.65 % NA SOLN
1.0000 | NASAL | Status: DC | PRN
  Administered 2024-02-07: 1 via NASAL
  Filled 2024-02-07: qty 44

## 2024-02-07 MED ORDER — IPRATROPIUM-ALBUTEROL 0.5-2.5 (3) MG/3ML IN SOLN
3.0000 mL | Freq: Four times a day (QID) | RESPIRATORY_TRACT | Status: DC | PRN
  Administered 2024-02-09: 3 mL via RESPIRATORY_TRACT
  Filled 2024-02-07 (×2): qty 3

## 2024-02-07 MED ORDER — FUROSEMIDE 10 MG/ML IJ SOLN
40.0000 mg | Freq: Once | INTRAMUSCULAR | Status: AC
  Administered 2024-02-07: 40 mg via INTRAVENOUS
  Filled 2024-02-07: qty 4

## 2024-02-07 MED ORDER — ENSURE PLUS HIGH PROTEIN PO LIQD
237.0000 mL | Freq: Three times a day (TID) | ORAL | Status: DC
  Administered 2024-02-07 – 2024-02-08 (×3): 237 mL via ORAL

## 2024-02-07 MED ORDER — ALUM & MAG HYDROXIDE-SIMETH 200-200-20 MG/5ML PO SUSP
15.0000 mL | ORAL | Status: DC | PRN
  Administered 2024-02-07: 15 mL via ORAL
  Filled 2024-02-07: qty 30

## 2024-02-07 NOTE — Plan of Care (Signed)
  Problem: Clinical Measurements: Goal: Ability to maintain clinical measurements within normal limits will improve Outcome: Progressing Goal: Will remain free from infection Outcome: Progressing Goal: Diagnostic test results will improve Outcome: Progressing Goal: Respiratory complications will improve Outcome: Progressing Goal: Cardiovascular complication will be avoided Outcome: Progressing   Problem: Pain Managment: Goal: General experience of comfort will improve and/or be controlled Outcome: Progressing   Problem: Elimination: Goal: Will not experience complications related to urinary retention Outcome: Progressing   Problem: Safety: Goal: Ability to remain free from injury will improve Outcome: Progressing

## 2024-02-07 NOTE — Plan of Care (Signed)
  Problem: Clinical Measurements: Goal: Will remain free from infection Outcome: Progressing   Problem: Coping: Goal: Level of anxiety will decrease Outcome: Progressing   Problem: Elimination: Goal: Will not experience complications related to bowel motility Outcome: Progressing   Problem: Skin Integrity: Goal: Risk for impaired skin integrity will decrease Outcome: Progressing

## 2024-02-07 NOTE — Progress Notes (Signed)
 Pt complaining that Bipap mask is not fitting correctly on her face and requesting for it to be off. Spo2 is 100% in 4L . Pt not in respiratory distress, RT is notified that Bipap was removed. No other concern at the moment. Plan of care continued.

## 2024-02-07 NOTE — Care Management Important Message (Signed)
 Important Message  Patient Details  Name: Wendy Ferrell MRN: [id number] Date of Birth: 03-11-1956   Important Message Given:  Yes - Medicare IM     Anise Kerns 02/07/2024, 1:17 PM

## 2024-02-07 NOTE — Progress Notes (Signed)
 PROGRESS NOTE    Wendy Ferrell  WUJ:[id number] DOB: [birth year] DOA: 02/06/2024 PCP: Shari Daughters, MD   Assessment & Plan:   Principal Problem:   Afib [facility]) Active Problems:   Chronic obstructive pulmonary disease with (acute) exacerbation (HCC)   Chronic respiratory failure with hypoxia (HCC)   COPD exacerbation (HCC)   A-fib (HCC)  Assessment and Plan: Acute on chronic hypoxic and hypercapnic respiratory failure: continue on supplemental oxygen  and wean back to baseline as tolerated.   Acute COPD exacerbation: continue on doxy, IV steroids, bronchodilators & encourage incentive spirometry    A. fib: w/ RVR. New onset. Continue on IV cardizem  drip and eliquis .    HTN: continue on losartan  . Holding home chlorthalidone    Nicotine  dependence: pt stated she quit smoking    PAD: continue on home dose of eliquis , plavix    Severe protein malnutrition: will start nutritional supplements   Thrombocytosis: etiology unclear. Will continue to monitor  Normocytic anemia: no need for a transfusion currently   Hyponatremia: stable from day prior. Will continue to monitor   DVT prophylaxis: eliquis   Code Status: full  Family Communication:  Disposition Plan: depends on PT/OT recs   Level of care: Progressive  Status is: Inpatient Remains inpatient appropriate because: severity of illness, requiring IV steroids     Consultants:    Procedures:   Antimicrobials: doxy    Subjective: Pt c/o shortness of breath & generalized weakness  Objective: Vitals:   02/06/24 2318 02/07/24 0400 02/07/24 0730 02/07/24 0744  BP: 116/76 127/76 (!) 146/61   Pulse: 66 67 75   Resp: 18 15 20    Temp: 98.1 F (36.7 C) 98.3 F (36.8 C) 97.9 F (36.6 C)   TempSrc: Axillary Axillary    SpO2: 100% 100% 92% 100%  Weight:      Height:        Intake/Output Summary (Last 24 hours) at 02/07/2024 0846 Last data filed at 02/07/2024 0543 Gross per 24 hour  Intake 2439.25 ml  Output  600 ml  Net 1839.25 ml   Filed Weights   02/06/24 1141  Weight: 45.4 kg    Examination:  General exam: Appears uncomfortable & anxious   Respiratory system: course breath sounds b/l. Wheezes b/l  Cardiovascular system: irregularly irregular. No rubs, gallops or clicks.  Gastrointestinal system: Abdomen is nondistended, soft and nontender. Normal bowel sounds heard. Central nervous system: Alert and oriented. Moves all extremities Psychiatry: Judgement and insight appears at baseline. Anxious mood and affect     Data Reviewed: I have personally reviewed following labs and imaging studies  CBC: Recent Labs  Lab 02/06/24 1144 02/07/24 0512  WBC 16.3* 13.8*  HGB 10.4* 9.7*  HCT 30.2* 28.5*  MCV 93.2 93.1  PLT 494* 480*   Basic Metabolic Panel: Recent Labs  Lab 02/06/24 1144 02/07/24 0512  NA 130* 130*  K 3.2* 4.2  CL 88* 90*  CO2 33* 32  GLUCOSE 109* 185*  BUN 15 16  CREATININE 0.65 0.66  CALCIUM  7.9* 8.6*  MG 1.8  --   PHOS 3.0  --    GFR: Estimated Creatinine Clearance: 49.6 mL/min (by C-G formula based on SCr of 0.66 mg/dL). Liver Function Tests: Recent Labs  Lab 02/06/24 1144  AST 27  ALT 23  ALKPHOS 124  BILITOT 0.8  PROT 6.1*  ALBUMIN 3.4*   No results for input(s): "LIPASE", "AMYLASE" in the last 168 hours. No results for input(s): "AMMONIA" in the last 168 hours. Coagulation  Profile: Recent Labs  Lab 02/06/24 1300  INR 1.1   Cardiac Enzymes: No results for input(s): "CKTOTAL", "CKMB", "CKMBINDEX", "TROPONINI" in the last 168 hours. BNP (last 3 results) No results for input(s): "PROBNP" in the last 8760 hours. HbA1C: No results for input(s): "HGBA1C" in the last 72 hours. CBG: No results for input(s): "GLUCAP" in the last 168 hours. Lipid Profile: No results for input(s): "CHOL", "HDL", "LDLCALC", "TRIG", "CHOLHDL", "LDLDIRECT" in the last 72 hours. Thyroid  Function Tests: Recent Labs    02/06/24 1144  TSH 0.462   Anemia  Panel: No results for input(s): "VITAMINB12", "FOLATE", "FERRITIN", "TIBC", "IRON", "RETICCTPCT" in the last 72 hours. Sepsis Labs: Recent Labs  Lab 02/06/24 1300  LATICACIDVEN 1.4    Recent Results (from the past 240 hours)  Resp panel by RT-PCR (RSV, Flu A&B, Covid) Anterior Nasal Swab     Status: None   Collection Time: 02/06/24 11:44 AM   Specimen: Anterior Nasal Swab  Result Value Ref Range Status   SARS Coronavirus 2 by RT PCR NEGATIVE NEGATIVE Final    Comment: (NOTE) SARS-CoV-2 target nucleic acids are NOT DETECTED.  The SARS-CoV-2 RNA is generally detectable in upper respiratory specimens during the acute phase of infection. The lowest concentration of SARS-CoV-2 viral copies this assay can detect is 138 copies/mL. A negative result does not preclude SARS-Cov-2 infection and should not be used as the sole basis for treatment or other patient management decisions. A negative result may occur with  improper specimen collection/handling, submission of specimen other than nasopharyngeal swab, presence of viral mutation(s) within the areas targeted by this assay, and inadequate number of viral copies(<138 copies/mL). A negative result must be combined with clinical observations, patient history, and epidemiological information. The expected result is Negative.  Fact Sheet for Patients:  BloggerCourse.com  Fact Sheet for Healthcare Providers:  SeriousBroker.it  This test is no t yet approved or cleared by the [place]  FDA and  has been authorized for detection and/or diagnosis of SARS-CoV-2 by FDA under an Emergency Use Authorization (EUA). This EUA will remain  in effect (meaning this test can be used) for the duration of the COVID-19 declaration under Section 564(b)(1) of the Act, 21 U.S.C.section 360bbb-3(b)(1), unless the authorization is terminated  or revoked sooner.       Influenza A by PCR NEGATIVE  NEGATIVE Final   Influenza B by PCR NEGATIVE NEGATIVE Final    Comment: (NOTE) The Xpert Xpress SARS-CoV-2/FLU/RSV plus assay is intended as an aid in the diagnosis of influenza from Nasopharyngeal swab specimens and should not be used as a sole basis for treatment. Nasal washings and aspirates are unacceptable for Xpert Xpress SARS-CoV-2/FLU/RSV testing.  Fact Sheet for Patients: BloggerCourse.com  Fact Sheet for Healthcare Providers: SeriousBroker.it  This test is not yet approved or cleared by the [place]  FDA and has been authorized for detection and/or diagnosis of SARS-CoV-2 by FDA under an Emergency Use Authorization (EUA). This EUA will remain in effect (meaning this test can be used) for the duration of the COVID-19 declaration under Section 564(b)(1) of the Act, 21 U.S.C. section 360bbb-3(b)(1), unless the authorization is terminated or revoked.     Resp Syncytial Virus by PCR NEGATIVE NEGATIVE Final    Comment: (NOTE) Fact Sheet for Patients: BloggerCourse.com  Fact Sheet for Healthcare Providers: SeriousBroker.it  This test is not yet approved or cleared by the [place]  FDA and has been authorized for detection and/or diagnosis of SARS-CoV-2 by FDA under an Emergency Use  Authorization (EUA). This EUA will remain in effect (meaning this test can be used) for the duration of the COVID-19 declaration under Section 564(b)(1) of the Act, 21 U.S.C. section 360bbb-3(b)(1), unless the authorization is terminated or revoked.  Performed at [facility], [address] [place], [place] [address]   Blood Culture (routine x 2)     Status: None (Preliminary result)   Collection Time: 02/06/24  1:00 PM   Specimen: BLOOD  Result Value Ref Range Status   Specimen Description BLOOD LEFT ANTECUBITAL  Final   Special Requests   Final    BOTTLES DRAWN AEROBIC  AND ANAEROBIC Blood Culture adequate volume   Culture   Final    NO GROWTH < 24 HOURS Performed at [facility], [address], [place], [place] [address]    Report Status PENDING  Incomplete  Blood Culture (routine x 2)     Status: None (Preliminary result)   Collection Time: 02/06/24  1:00 PM   Specimen: BLOOD  Result Value Ref Range Status   Specimen Description BLOOD BLOOD LEFT FOREARM  Final   Special Requests   Final    BOTTLES DRAWN AEROBIC AND ANAEROBIC Blood Culture results may not be optimal due to an inadequate volume of blood received in culture bottles   Culture   Final    NO GROWTH < 24 HOURS Performed at [facility], [address] [place], [place] [address]    Report Status PENDING  Incomplete         Radiology Studies: DG Chest Port 1 View Result Date: 02/06/2024 CLINICAL DATA:  Shortness of breath EXAM: PORTABLE CHEST 1 VIEW COMPARISON:  Chest x-ray performed Jan 24, 2024 FINDINGS: Lungs are hyperexpanded. No significant effusion or pneumothorax. The previously observed left perihilar nodule is not clearly seen on the current exam. Several leads overlie the patient. Underlying changes osteopenia. IMPRESSION: 1. Hyperexpansion of the lungs which can be observed in the setting of COPD. Electronically Signed   By: Reagan Camera M.D.   On: 02/06/2024 14:44        Scheduled Meds:  apixaban   5 mg Oral BID   atorvastatin   40 mg Oral Daily   budesonide -glycopyrrolate -formoterol   2 puff Inhalation BID   clopidogrel   75 mg Oral Daily   doxycycline   100 mg Oral Q12H   guaiFENesin   1,200 mg Oral BID   ipratropium-albuterol   3 mL Nebulization TID   loratadine   10 mg Oral Daily   losartan   25 mg Oral Daily   methylPREDNISolone  (SOLU-MEDROL ) injection  40 mg Intravenous Q12H   Continuous Infusions:  diltiazem  (CARDIZEM ) infusion 7.5 mg/hr (02/07/24 0608)   lactated ringers  150 mL/hr at 02/07/24 0141     LOS: 1 day       Alphonsus Jeans, MD [facility] Pager [phone]  If 7PM-7AM, please contact night-coverage [email or web address] 02/07/2024, 8:46 AM

## 2024-02-08 DIAGNOSIS — J441 Chronic obstructive pulmonary disease with (acute) exacerbation: Secondary | ICD-10-CM | POA: Diagnosis not present

## 2024-02-08 DIAGNOSIS — I4891 Unspecified atrial fibrillation: Secondary | ICD-10-CM | POA: Diagnosis not present

## 2024-02-08 LAB — BASIC METABOLIC PANEL WITH GFR
Anion gap: 12 (ref 5–15)
BUN: 29 mg/dL — ABNORMAL HIGH (ref 8–23)
CO2: 31 mmol/L (ref 22–32)
Calcium: 8.6 mg/dL — ABNORMAL LOW (ref 8.9–10.3)
Chloride: 84 mmol/L — ABNORMAL LOW (ref 98–111)
Creatinine, Ser: 0.85 mg/dL (ref 0.44–1.00)
GFR, Estimated: >60 mL/min (ref >60)
Glucose, Bld: 146 mg/dL — ABNORMAL HIGH (ref 70–99)
Potassium: 4.1 mmol/L (ref 3.5–5.1)
Sodium: 127 mmol/L — ABNORMAL LOW (ref 135–145)

## 2024-02-08 LAB — CBC
HCT: 27 % — ABNORMAL LOW (ref 36.0–46.0)
Hemoglobin: 9.5 g/dL — ABNORMAL LOW (ref 12.0–15.0)
MCH: 32.6 pg (ref 26.0–34.0)
MCHC: 35.2 g/dL (ref 30.0–36.0)
MCV: 92.8 fL (ref 80.0–100.0)
Platelets: 476 10*3/uL — ABNORMAL HIGH (ref 150–400)
RBC: 2.91 MIL/uL — ABNORMAL LOW (ref 3.87–5.11)
RDW: 14.5 % (ref 11.5–15.5)
WBC: 19.2 10*3/uL — ABNORMAL HIGH (ref 4.0–10.5)
nRBC: 0 % (ref 0.0–0.2)

## 2024-02-08 MED ORDER — LORAZEPAM 0.5 MG PO TABS
0.5000 mg | ORAL_TABLET | Freq: Two times a day (BID) | ORAL | Status: DC | PRN
  Administered 2024-02-08 – 2024-02-09 (×3): 0.5 mg via ORAL
  Filled 2024-02-08 (×3): qty 1

## 2024-02-08 MED ORDER — DILTIAZEM HCL 30 MG PO TABS
30.0000 mg | ORAL_TABLET | Freq: Four times a day (QID) | ORAL | Status: DC
  Administered 2024-02-08 – 2024-02-09 (×3): 30 mg via ORAL
  Filled 2024-02-08 (×3): qty 1

## 2024-02-08 NOTE — Progress Notes (Signed)
 PROGRESS NOTE    Wendy Ferrell  VWU:[id number] DOB: Apr 14, 1956 DOA: 02/06/2024 PCP: Shari Daughters, MD   Assessment & Plan:   Principal Problem:   Afib [facility]) Active Problems:   Chronic obstructive pulmonary disease with (acute) exacerbation (HCC)   Chronic respiratory failure with hypoxia (HCC)   COPD exacerbation (HCC)   A-fib (HCC)  Assessment and Plan: Acute on chronic hypoxic and hypercapnic respiratory failure: continue on supplemental oxygen  and back to baseline, 4L [place]   Acute COPD exacerbation: continue on doxy, IV steroids, bronchodilators & encourage incentive spirometry     A. fib: w/ RVR. New onset. Weaning IV dilt drip and start po dilt. Continue on eliquis      HTN: continue on losartan . Holding home chlorthalidone    Nicotine  dependence: pt stated she quit smoking    PAD: continue on home dose of plavix , eliquis    Severe protein malnutrition: continue on nutritional supplement   Thrombocytosis: etiology unclear. Will continue to monitor   Normocytic anemia: will transfuse if Hb < 7.0   Hyponatremia: labile. Will continue to monitor   Anxiety: severity unknown. Ativan prn   DVT prophylaxis: eliquis   Code Status: full  Family Communication:  Disposition Plan: depends on PT/OT recs   Level of care: Progressive  Status is: Inpatient Remains inpatient appropriate because: severity of illness, requiring IV steroids     Consultants:    Procedures:   Antimicrobials: doxy    Subjective: Pt c/o anxiety   Objective: Vitals:   02/07/24 2329 02/08/24 0338 02/08/24 0719 02/08/24 0737  BP: (!) 118/57 110/64  (!) 132/114  Pulse: 93 72  91  Resp: 20 (!) 22  19  Temp: 98.2 F (36.8 C) 98.2 F (36.8 C)  98.4 F (36.9 C)  TempSrc: Axillary Axillary  Oral  SpO2: 100% 100% 97% 93%  Weight:      Height:        Intake/Output Summary (Last 24 hours) at 02/08/2024 0859 Last data filed at 02/08/2024 0355 Gross per 24 hour  Intake 1121.38 ml   Output --  Net 1121.38 ml   Filed Weights   02/06/24 1141  Weight: 45.4 kg    Examination:  General exam: appears anxious    Respiratory system: course breath sounds b/l. Wheezes b/l  Cardiovascular system: irregularly irregular.  Gastrointestinal system: abd is soft, NT, ND & hypoactive bowel sounds  Central nervous system: Alert & oriented. Moves all extremities  Psychiatry: Judgement and insight appears at baseline. Anxious mood and affect    Data Reviewed: I have personally reviewed following labs and imaging studies  CBC: Recent Labs  Lab 02/06/24 1144 02/07/24 0512 02/08/24 0548  WBC 16.3* 13.8* 19.2*  HGB 10.4* 9.7* 9.5*  HCT 30.2* 28.5* 27.0*  MCV 93.2 93.1 92.8  PLT 494* 480* 476*   Basic Metabolic Panel: Recent Labs  Lab 02/06/24 1144 02/07/24 0512 02/08/24 0548  NA 130* 130* 127*  K 3.2* 4.2 4.1  CL 88* 90* 84*  CO2 33* 32 31  GLUCOSE 109* 185* 146*  BUN 15 16 29*  CREATININE 0.65 0.66 0.85  CALCIUM  7.9* 8.6* 8.6*  MG 1.8  --   --   PHOS 3.0  --   --    GFR: Estimated Creatinine Clearance: 46.7 mL/min (by C-G formula based on SCr of 0.85 mg/dL). Liver Function Tests: Recent Labs  Lab 02/06/24 1144  AST 27  ALT 23  ALKPHOS 124  BILITOT 0.8  PROT 6.1*  ALBUMIN 3.4*  No results for input(s): "LIPASE", "AMYLASE" in the last 168 hours. No results for input(s): "AMMONIA" in the last 168 hours. Coagulation Profile: Recent Labs  Lab 02/06/24 1300  INR 1.1   Cardiac Enzymes: No results for input(s): "CKTOTAL", "CKMB", "CKMBINDEX", "TROPONINI" in the last 168 hours. BNP (last 3 results) No results for input(s): "PROBNP" in the last 8760 hours. HbA1C: No results for input(s): "HGBA1C" in the last 72 hours. CBG: No results for input(s): "GLUCAP" in the last 168 hours. Lipid Profile: No results for input(s): "CHOL", "HDL", "LDLCALC", "TRIG", "CHOLHDL", "LDLDIRECT" in the last 72 hours. Thyroid  Function Tests: Recent Labs     02/06/24 1144  TSH 0.462   Anemia Panel: No results for input(s): "VITAMINB12", "FOLATE", "FERRITIN", "TIBC", "IRON", "RETICCTPCT" in the last 72 hours. Sepsis Labs: Recent Labs  Lab 02/06/24 1300  LATICACIDVEN 1.4    Recent Results (from the past 240 hours)  Resp panel by RT-PCR (RSV, Flu A&B, Covid) Anterior Nasal Swab     Status: None   Collection Time: 02/06/24 11:44 AM   Specimen: Anterior Nasal Swab  Result Value Ref Range Status   SARS Coronavirus 2 by RT PCR NEGATIVE NEGATIVE Final    Comment: (NOTE) SARS-CoV-2 target nucleic acids are NOT DETECTED.  The SARS-CoV-2 RNA is generally detectable in upper respiratory specimens during the acute phase of infection. The lowest concentration of SARS-CoV-2 viral copies this assay can detect is 138 copies/mL. A negative result does not preclude SARS-Cov-2 infection and should not be used as the sole basis for treatment or other patient management decisions. A negative result may occur with  improper specimen collection/handling, submission of specimen other than nasopharyngeal swab, presence of viral mutation(s) within the areas targeted by this assay, and inadequate number of viral copies(<138 copies/mL). A negative result must be combined with clinical observations, patient history, and epidemiological information. The expected result is Negative.  Fact Sheet for Patients:  BloggerCourse.com  Fact Sheet for Healthcare Providers:  SeriousBroker.it  This test is no t yet approved or cleared by the [place]  FDA and  has been authorized for detection and/or diagnosis of SARS-CoV-2 by FDA under an Emergency Use Authorization (EUA). This EUA will remain  in effect (meaning this test can be used) for the duration of the COVID-19 declaration under Section 564(b)(1) of the Act, 21 U.S.C.section 360bbb-3(b)(1), unless the authorization is terminated  or revoked sooner.        Influenza A by PCR NEGATIVE NEGATIVE Final   Influenza B by PCR NEGATIVE NEGATIVE Final    Comment: (NOTE) The Xpert Xpress SARS-CoV-2/FLU/RSV plus assay is intended as an aid in the diagnosis of influenza from Nasopharyngeal swab specimens and should not be used as a sole basis for treatment. Nasal washings and aspirates are unacceptable for Xpert Xpress SARS-CoV-2/FLU/RSV testing.  Fact Sheet for Patients: BloggerCourse.com  Fact Sheet for Healthcare Providers: SeriousBroker.it  This test is not yet approved or cleared by the [place]  FDA and has been authorized for detection and/or diagnosis of SARS-CoV-2 by FDA under an Emergency Use Authorization (EUA). This EUA will remain in effect (meaning this test can be used) for the duration of the COVID-19 declaration under Section 564(b)(1) of the Act, 21 U.S.C. section 360bbb-3(b)(1), unless the authorization is terminated or revoked.     Resp Syncytial Virus by PCR NEGATIVE NEGATIVE Final    Comment: (NOTE) Fact Sheet for Patients: BloggerCourse.com  Fact Sheet for Healthcare Providers: SeriousBroker.it  This test is not yet approved or  cleared by the [place]  FDA and has been authorized for detection and/or diagnosis of SARS-CoV-2 by FDA under an Emergency Use Authorization (EUA). This EUA will remain in effect (meaning this test can be used) for the duration of the COVID-19 declaration under Section 564(b)(1) of the Act, 21 U.S.C. section 360bbb-3(b)(1), unless the authorization is terminated or revoked.  Performed at [facility], [address] [place], [place] [address]   Blood Culture (routine x 2)     Status: None (Preliminary result)   Collection Time: 02/06/24  1:00 PM   Specimen: BLOOD  Result Value Ref Range Status   Specimen Description BLOOD LEFT ANTECUBITAL  Final   Special Requests    Final    BOTTLES DRAWN AEROBIC AND ANAEROBIC Blood Culture adequate volume   Culture   Final    NO GROWTH < 24 HOURS Performed at [facility], [address] [place], [place] [address]    Report Status PENDING  Incomplete  Blood Culture (routine x 2)     Status: None (Preliminary result)   Collection Time: 02/06/24  1:00 PM   Specimen: BLOOD  Result Value Ref Range Status   Specimen Description BLOOD BLOOD LEFT FOREARM  Final   Special Requests   Final    BOTTLES DRAWN AEROBIC AND ANAEROBIC Blood Culture results may not be optimal due to an inadequate volume of blood received in culture bottles   Culture   Final    NO GROWTH < 24 HOURS Performed at [facility], [address], [place], [place] [address]    Report Status PENDING  Incomplete         Radiology Studies: DG Chest Port 1 View Result Date: 02/06/2024 CLINICAL DATA:  Shortness of breath EXAM: PORTABLE CHEST 1 VIEW COMPARISON:  Chest x-ray performed Jan 24, 2024 FINDINGS: Lungs are hyperexpanded. No significant effusion or pneumothorax. The previously observed left perihilar nodule is not clearly seen on the current exam. Several leads overlie the patient. Underlying changes osteopenia. IMPRESSION: 1. Hyperexpansion of the lungs which can be observed in the setting of COPD. Electronically Signed   By: Reagan Camera M.D.   On: 02/06/2024 14:44        Scheduled Meds:  apixaban   5 mg Oral BID   atorvastatin   40 mg Oral Daily   budesonide -glycopyrrolate -formoterol   2 puff Inhalation BID   clopidogrel   75 mg Oral Daily   docusate sodium   200 mg Oral BID   feeding supplement  237 mL Oral TID   guaiFENesin   1,200 mg Oral BID   ipratropium-albuterol   3 mL Nebulization TID   loratadine   10 mg Oral Daily   losartan   25 mg Oral Daily   methylPREDNISolone  (SOLU-MEDROL ) injection  40 mg Intravenous Q12H   polyethylene glycol  17 g Oral Daily   pregabalin   50 mg Oral BID   Continuous Infusions:   diltiazem  (CARDIZEM ) infusion 12.5 mg/hr (02/08/24 0405)   doxycycline  (VIBRAMYCIN ) IV 100 mg (02/07/24 2059)     LOS: 2 days       Alphonsus Jeans, MD [facility] Pager [phone]  If 7PM-7AM, please contact night-coverage [email or web address] 02/08/2024, 8:59 AM

## 2024-02-08 NOTE — Progress Notes (Signed)
 PT Cancellation Note  Patient Details Name: Wendy Ferrell MRN: [id number] DOB: 22-Mar-1956   Cancelled Treatment:    Reason Eval/Treat Not Completed: Other (comment). Pt with a lot of anxiety this AM, stated her breathing is hard. Unable/unwilling to participate in mobility at this time. RN notified of pt status, PT to re-attempt as able.   Darien Eden PT, DPT 10:40 AM,02/08/24

## 2024-02-08 NOTE — Evaluation (Signed)
 Occupational Therapy Evaluation Patient Details Name: Wendy Ferrell MRN: [id number] DOB: 1956-01-14 Today's Date: 02/08/2024   History of Present Illness   Wendy Ferrell is a 68 y.o. 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     Patient was recently hospitalized for COPD exacerbation and discharged home on p.o. steroid, which she completed about 5 days ago.  Initially she felt better however about 4 to 5 days ago, she started to have productive cough and wheezing again.     Clinical Impressions Pt seen for OT evaluation this date.  Pt was recently hospitalized earlier this month with a fall. Pt lives with her boyfriend in a mobile home, boyfriend works during the day so the patient is home alone.  She presents with increased weakness, increased shortness of breath with activity, increased heart rate and decreased ability to perform basic self care and IADL tasks.  She was independent with dressing and light meal prep prior to admission.  Her daughter assists her with a bath and her hair as well as performing homemaking tasks.  She had poor toleration of sitting edge of bed this date with shortness of breath, increased heart rate and increased pain in bilateral LEs and was unable to tolerate sit to stand transfers.  She would benefit from skilled OT services to maximize her safety and independence in necessary daily tasks.       If plan is discharge home, recommend the following:   Help with stairs or ramp for entrance;A little help with walking and/or transfers;A little help with bathing/dressing/bathroom;Assistance with cooking/housework;Assist for transportation     Functional Status Assessment   Patient has had a recent decline in their functional status and/or demonstrates limited  ability to make significant improvements in function in a reasonable and predictable amount of time     Equipment Recommendations   Wheelchair (measurements OT)     Recommendations for Other Services         Precautions/Restrictions   Precautions Precautions: Fall Recall of Precautions/Restrictions: Intact Restrictions Weight Bearing Restrictions Per Provider Order: Yes LLE Weight Bearing Per Provider Order: Weight bearing as tolerated     Mobility Bed Mobility Overal bed mobility: Modified Independent Bed Mobility: Supine to Sit, Sit to Supine     Supine to sit: Modified independent (Device/Increase time) Sit to supine: Modified independent (Device/Increase time)   General bed mobility comments: increased time required for all transfers, shortness of breath noted, increased heart rate    Transfers                   General transfer comment: Pt was not able to tolerate sitting EOB and declined sit to stand due to pain in BLEs.      Balance Overall balance assessment: Needs assistance Sitting-balance support: Feet supported, No upper extremity supported Sitting balance-Leahy Scale: Good                                     ADL either performed or assessed with clinical judgement   ADL Overall ADL's : Needs assistance/impaired Eating/Feeding: Modified independent   Grooming: Bed level;Set up   Upper Body Bathing: Set up;Minimal assistance   Lower Body Bathing: Set up;Moderate assistance   Upper Body Dressing : Set up;Modified  independent   Lower Body Dressing: Minimal assistance;Sit to/from stand   Toilet Transfer: Contact guard assist;Rolling walker (2 wheels);BSC/3in1             General ADL Comments: Pt with increased heart rate with activity and increased shortness of breath, requires rest breaks during session.  She was not able to tolerate sitting EOB, increased pain reported in bilateral LEs and could not stand due to  pain.     Vision Baseline Vision/History: 1 Wears glasses Patient Visual Report: No change from baseline       Perception         Praxis         Pertinent Vitals/Pain Pain Assessment Pain Score: 6  Pain Location: left hip and legs Pain Descriptors / Indicators: Sore, Aching Pain Intervention(s): Limited activity within patient's tolerance, Monitored during session, Repositioned     Extremity/Trunk Assessment Upper Extremity Assessment Upper Extremity Assessment: Generalized weakness;Right hand dominant   Lower Extremity Assessment Lower Extremity Assessment: Defer to PT evaluation       Communication Communication Communication: No apparent difficulties   Cognition Arousal: Alert Behavior During Therapy: WFL for tasks assessed/performed, Anxious Cognition: No apparent impairments             OT - Cognition Comments: A/OX4                 Following commands: Intact Following commands impaired: Follows one step commands with increased time     Cueing  General Comments   Cueing Techniques: Verbal cues      Exercises     Shoulder Instructions      Home Living Family/patient expects to be discharged to:: Private residence Living Arrangements: Spouse/significant other Available Help at Discharge: Family;Available PRN/intermittently Type of Home: Mobile home Home Access: Stairs to enter Entrance Stairs-Number of Steps: 6 Entrance Stairs-Rails: Can reach both Home Layout: One level     Bathroom Shower/Tub: Producer, television/film/video: Handicapped height         Additional Comments: walker, she reports she really needs a transport w/c to be able to go to her appts and out in the community      Prior Functioning/Environment Prior Level of Function : Independent/Modified Independent             Mobility Comments: 4L chronic [place], pt reports she has a new walker since her last hospital admission earlier this month. ADLs  Comments: lives with boyfriend who works during the day. Pt has 2 daughters who are also available for assistance at times for homemaking tasks. Pt reports she stays home alone during the day, very limited activity. She reports being able to perform light meal prep for breakfast and lunch. She was able to wash a few dishes. She spends most of the day watching TV and naps in the afternoon. Her boyfriend helps with the household chores and her daughter comes over one time a week to clean and help her with a bath. Prior to admission she was able to dress herself but has to take increased time to perform task.    OT Problem List: Decreased strength;Decreased range of motion;Decreased activity tolerance;Impaired balance (sitting and/or standing);Pain;Decreased knowledge of use of DME or AE   OT Treatment/Interventions: Self-care/ADL training;Therapeutic exercise;Energy conservation;DME and/or AE instruction;Therapeutic activities;Balance training;Patient/family education      OT Goals(Current goals can be found in the care plan section)   Acute Rehab OT Goals Patient Stated Goal: Pt would like to return home  to her normal routine OT Goal Formulation: With patient Time For Goal Achievement: 02/22/24 Potential to Achieve Goals: Good ADL Goals Pt Will Perform Lower Body Dressing: with modified independence Pt Will Transfer to Toilet: with modified independence   OT Frequency:  Min 2X/week    Co-evaluation              AM-PAC OT "6 Clicks" Daily Activity     Outcome Measure Help from another person eating meals?: None Help from another person taking care of personal grooming?: None Help from another person toileting, which includes using toliet, bedpan, or urinal?: A Little Help from another person bathing (including washing, rinsing, drying)?: A Little Help from another person to put on and taking off regular upper body clothing?: None Help from another person to put on and taking off  regular lower body clothing?: A Little 6 Click Score: 21   End of Session Equipment Utilized During Treatment: Oxygen   Activity Tolerance: Patient limited by pain;Patient limited by fatigue Patient left: in bed;with call bell/phone within reach  OT Visit Diagnosis: Unsteadiness on feet (R26.81);Muscle weakness (generalized) (M62.81);History of falling (Z91.81)                Time: 0454-0981 OT Time Calculation (min): 38 min Charges:  OT General Charges $OT Visit: 1 Visit OT Evaluation $OT Eval Moderate Complexity: 1 Mod OT Treatments $Self Care/Home Management : 8-22 mins Pauline Pegues T Salvatore Shear, OTR/L, CLT 02/08/2024, 3:01 PM

## 2024-02-08 NOTE — Progress Notes (Signed)
 Patient is resting comfortably at end of shift and is using bedpan. Patient was very anxious towards the first half of day and PRN Ativan was ordered and effective calming patient down allowing for some sleep. Cardizem  was titrated down to 5mg  an hour from 12.5 and PO Diltiazem  started. Patient did not have much of an appetite and her main nutrition today was ensure drinks.

## 2024-02-08 NOTE — Plan of Care (Signed)
  Problem: Clinical Measurements: Goal: Ability to maintain clinical measurements within normal limits will improve Outcome: Progressing Goal: Will remain free from infection Outcome: Progressing Goal: Respiratory complications will improve Outcome: Progressing Goal: Cardiovascular complication will be avoided Outcome: Progressing   Problem: Coping: Goal: Level of anxiety will decrease Outcome: Progressing   Problem: Pain Managment: Goal: General experience of comfort will improve and/or be controlled Outcome: Progressing   Problem: Safety: Goal: Ability to remain free from injury will improve Outcome: Progressing

## 2024-02-09 ENCOUNTER — Inpatient Hospital Stay
Admit: 2024-02-09 | Discharge: 2024-02-09 | Disposition: A | Discharge status: Home / Self Care | Attending: Internal Medicine | Admitting: Internal Medicine

## 2024-02-09 ENCOUNTER — Inpatient Hospital Stay

## 2024-02-09 DIAGNOSIS — J441 Chronic obstructive pulmonary disease with (acute) exacerbation: Secondary | ICD-10-CM | POA: Diagnosis not present

## 2024-02-09 DIAGNOSIS — I4891 Unspecified atrial fibrillation: Secondary | ICD-10-CM | POA: Diagnosis not present

## 2024-02-09 LAB — BASIC METABOLIC PANEL WITH GFR
Anion gap: 11 (ref 5–15)
BUN: 53 mg/dL — ABNORMAL HIGH (ref 8–23)
CO2: 32 mmol/L (ref 22–32)
Calcium: 8.7 mg/dL — ABNORMAL LOW (ref 8.9–10.3)
Chloride: 83 mmol/L — ABNORMAL LOW (ref 98–111)
Creatinine, Ser: 1.17 mg/dL — ABNORMAL HIGH (ref 0.44–1.00)
GFR, Estimated: 51 mL/min — ABNORMAL LOW (ref >60)
Glucose, Bld: 145 mg/dL — ABNORMAL HIGH (ref 70–99)
Potassium: 4.8 mmol/L (ref 3.5–5.1)
Sodium: 126 mmol/L — ABNORMAL LOW (ref 135–145)

## 2024-02-09 LAB — CBC
HCT: 29.7 % — ABNORMAL LOW (ref 36.0–46.0)
Hemoglobin: 10.2 g/dL — ABNORMAL LOW (ref 12.0–15.0)
MCH: 32.1 pg (ref 26.0–34.0)
MCHC: 34.3 g/dL (ref 30.0–36.0)
MCV: 93.4 fL (ref 80.0–100.0)
Platelets: 442 10*3/uL — ABNORMAL HIGH (ref 150–400)
RBC: 3.18 MIL/uL — ABNORMAL LOW (ref 3.87–5.11)
RDW: 14.3 % (ref 11.5–15.5)
WBC: 34.4 10*3/uL — ABNORMAL HIGH (ref 4.0–10.5)
nRBC: 0 % (ref 0.0–0.2)

## 2024-02-09 LAB — GLUCOSE, CAPILLARY
Glucose-Capillary: 158 mg/dL — ABNORMAL HIGH (ref 70–99)
Glucose-Capillary: 168 mg/dL — ABNORMAL HIGH (ref 70–99)
Glucose-Capillary: 147 mg/dL — ABNORMAL HIGH (ref 70–99)
Glucose-Capillary: 214 mg/dL — ABNORMAL HIGH (ref 70–99)

## 2024-02-09 LAB — MRSA NEXT GEN BY PCR, NASAL: MRSA by PCR Next Gen: NOT DETECTED

## 2024-02-09 LAB — PROCALCITONIN: Procalcitonin: <0.1 ng/mL

## 2024-02-09 LAB — BRAIN NATRIURETIC PEPTIDE: B Natriuretic Peptide: 80.7 pg/mL (ref 0.0–100.0)

## 2024-02-09 MED ORDER — MORPHINE SULFATE (PF) 2 MG/ML IV SOLN
1.0000 mg | INTRAVENOUS | Status: DC | PRN
  Administered 2024-02-09 (×3): 1 mg via INTRAVENOUS
  Filled 2024-02-09 (×3): qty 1

## 2024-02-09 MED ORDER — MORPHINE SULFATE (PF) 2 MG/ML IV SOLN
1.0000 mg | INTRAVENOUS | Status: DC | PRN
  Filled 2024-02-09: qty 1

## 2024-02-09 MED ORDER — BISACODYL 5 MG PO TBEC
5.0000 mg | DELAYED_RELEASE_TABLET | Freq: Every day | ORAL | Status: DC | PRN
  Administered 2024-02-09: 5 mg via ORAL
  Filled 2024-02-09: qty 1

## 2024-02-09 MED ORDER — CHLORHEXIDINE GLUCONATE CLOTH 2 % EX PADS
6.0000 | MEDICATED_PAD | Freq: Every day | CUTANEOUS | Status: DC
  Administered 2024-02-09: 6 via TOPICAL

## 2024-02-09 MED ORDER — FUROSEMIDE 10 MG/ML IJ SOLN
20.0000 mg | Freq: Once | INTRAMUSCULAR | Status: AC
  Administered 2024-02-09: 20 mg via INTRAVENOUS
  Filled 2024-02-09: qty 2

## 2024-02-09 MED ORDER — LORAZEPAM 2 MG/ML IJ SOLN
0.5000 mg | Freq: Once | INTRAMUSCULAR | Status: AC
  Administered 2024-02-09: 0.5 mg via INTRAVENOUS
  Filled 2024-02-09: qty 1

## 2024-02-09 MED ORDER — SODIUM CHLORIDE 1 G PO TABS
1.0000 g | ORAL_TABLET | Freq: Two times a day (BID) | ORAL | Status: DC
  Administered 2024-02-09: 1 g via ORAL
  Filled 2024-02-09 (×2): qty 1

## 2024-02-09 MED ORDER — MORPHINE SULFATE (PF) 2 MG/ML IV SOLN
1.0000 mg | Freq: Once | INTRAVENOUS | Status: AC
  Administered 2024-02-09: 1 mg via INTRAVENOUS
  Filled 2024-02-09: qty 1

## 2024-02-09 MED ORDER — DILTIAZEM HCL-DEXTROSE 125-5 MG/125ML-% IV SOLN (PREMIX)
5.0000 mg/h | INTRAVENOUS | Status: DC
  Filled 2024-02-09 (×2): qty 125

## 2024-02-09 MED ORDER — FUROSEMIDE 10 MG/ML IJ SOLN
40.0000 mg | INTRAMUSCULAR | Status: AC
  Administered 2024-02-09: 40 mg via INTRAVENOUS
  Filled 2024-02-09: qty 4

## 2024-02-09 MED ORDER — INSULIN ASPART 100 UNIT/ML IJ SOLN: 0.0000 [IU] | Freq: Three times a day (TID) | INTRAMUSCULAR | Status: DC

## 2024-02-09 MED ORDER — MORPHINE SULFATE (PF) 2 MG/ML IV SOLN: 2.0000 mg | Freq: Once | INTRAVENOUS | Status: DC

## 2024-02-09 MED ORDER — INSULIN ASPART 100 UNIT/ML IJ SOLN: 0.0000 [IU] | Freq: Every day | INTRAMUSCULAR | Status: DC

## 2024-02-09 MED ORDER — INSULIN ASPART 100 UNIT/ML IJ SOLN
0.0000 [IU] | INTRAMUSCULAR | Status: DC
  Administered 2024-02-09: 5 [IU] via SUBCUTANEOUS
  Filled 2024-02-09: qty 1

## 2024-02-09 NOTE — Consult Note (Signed)
 CARDIOLOGY CONSULT NOTE               Patient ID: Wendy Ferrell MRN: [id number] DOB/AGE: Apr 12, 1956 68 y.o.  Admit date: 02/06/2024 Referring Physician Dr. Antoniette Batty hospitalist Primary Physician Dr. Esperanza Hedges primary Primary Cardiologist Dr. Debborah Fairly Reason for Consultation tachycardia paroxysmal atrial fibrillation  HPI: 68 year old white female multiple medical problems end-stage COPD chronic hypoxemia hypercapnic respiratory failure on 4 L peripheral vascular disease on Eliquis  insulin-dependent diabetes presented with cough congestion COPD shortness of breath she has a history of tachycardia recurrent palpitations paroxysmal tachycardic episodes now with what appeared to be some measure of respiratory failure shortness of breath requiring BiPAP patient still smokes denies any chest pain mostly shortness of breath and dyspnea  Review of systems complete and found to be negative unless listed above     Past Medical History:  Diagnosis Date   COPD (chronic obstructive pulmonary disease) (HCC)    Hypertension    Nicotine  addiction    Oxygen  dependent    Peripheral vascular disease (HCC)     Past Surgical History:  Procedure Laterality Date   ABDOMINAL HYSTERECTOMY     COLONOSCOPY WITH PROPOFOL  N/A 02/21/2016   Procedure: COLONOSCOPY WITH PROPOFOL ;  Surgeon: Deveron Fly, MD;  Location: [facility] ENDOSCOPY;  Service: Endoscopy;  Laterality: N/A;   LOWER EXTREMITY ANGIOGRAPHY Right 05/28/2017   Procedure: Lower Extremity Angiography;  Surgeon: Jackquelyn Mass, MD;  Location: [facility] INVASIVE CV LAB;  Service: Cardiovascular;  Laterality: Right;   MASTOIDECTOMY     NECK SURGERY     PERIPHERAL VASCULAR CATHETERIZATION Left 05/29/2016   Procedure: Lower Extremity Angiography;  Surgeon: Jackquelyn Mass, MD;  Location: [facility] INVASIVE CV LAB;  Service: Cardiovascular;  Laterality: Left;   PERIPHERAL VASCULAR CATHETERIZATION  05/29/2016   Procedure: Lower Extremity  Intervention;  Surgeon: Jackquelyn Mass, MD;  Location: [facility] INVASIVE CV LAB;  Service: Cardiovascular;;   TONSILLECTOMY      Medications Prior to Admission  Medication Sig Dispense Refill Last Dose/Taking   atorvastatin  (LIPITOR) 40 MG tablet Take 1 tablet (40 mg total) by mouth daily. 90 tablet 0 02/05/2024   cetirizine  (ZYRTEC ) 10 MG tablet TAKE 1 TABLET BY MOUTH EVERY DAY 90 tablet 1 02/05/2024   chlorthalidone (HYGROTON) 25 MG tablet TAKE 1/2 TABLET BY MOUTH DAILY AS DIRECTED 45 tablet 1 02/05/2024   clopidogrel  (PLAVIX ) 75 MG tablet Take 1 tablet (75 mg total) by mouth daily. 90 tablet 4 02/05/2024   ELIQUIS  5 MG TABS tablet TAKE 1 TABLET BY MOUTH TWICE A DAY 60 tablet 2 02/05/2024   fluticasone  (FLONASE ) 50 MCG/ACT nasal spray Place 1 spray into both nostrils daily. 16 mL 2 02/05/2024   Fluticasone -Umeclidin-Vilant (TRELEGY ELLIPTA ) 100-62.5-25 MCG/ACT AEPB USE AS DIRECTED 1 INHALATION PER DAY 180 each 1 02/05/2024   Ipratropium-Albuterol  (COMBIVENT  RESPIMAT) 20-100 MCG/ACT AERS respimat Inhale 1 puff into the lungs every 6 (six) hours as needed for wheezing. 4 g 5 02/05/2024   losartan  (COZAAR ) 25 MG tablet TAKE 1 TABLET BY MOUTH EVERY DAY 90 tablet 1 02/05/2024   oxyCODONE  (OXY IR/ROXICODONE ) 5 MG immediate release tablet Take 1-2 tablets (5-10 mg total) by mouth every 4 (four) hours as needed for moderate pain (pain score 4-6) or severe pain (pain score 7-10). 30 tablet 0 Taking As Needed   OXYGEN  Inhale into the lungs.      predniSONE  (DELTASONE ) 20 MG tablet Take 20 mg by mouth daily with breakfast. (Patient not taking: Reported on 02/06/2024)  Not Taking   Social History   Socioeconomic History   Marital status: Legally Separated    Spouse name: Not on file   Number of children: Not on file   Years of education: Not on file   Highest education level: Not on file  Occupational History   Not on file  Tobacco Use   Smoking status: Former    Current packs/day: 0.50    Average  packs/day: 0.5 packs/day for 45.0 years (22.5 ttl pk-yrs)    Types: Cigarettes   Smokeless tobacco: Never  Vaping Use   Vaping status: Never Used  Substance and Sexual Activity   Alcohol use: No   Drug use: No   Sexual activity: Not Currently    Partners: Male  Other Topics Concern   Not on file  Social History Narrative   Not on file   Social Drivers of Health   Financial Resource Strain: Not on file  Food Insecurity: No Food Insecurity (02/06/2024)   Hunger Vital Sign    Worried About Running Out of Food in the Last Year: Never true    Ran Out of Food in the Last Year: Never true  Transportation Needs: No Transportation Needs (02/06/2024)   PRAPARE - Administrator, Civil Service (Medical): No    Lack of Transportation (Non-Medical): No  Physical Activity: Not on file  Stress: Not on file  Social Connections: Unknown (02/06/2024)   Social Connection and Isolation Panel [NHANES]    Frequency of Communication with Friends and Family: Twice a week    Frequency of Social Gatherings with Friends and Family: Twice a week    Attends Religious Services: Patient declined    Database administrator or Organizations: No    Attends Engineer, structural: 1 to 4 times per year    Marital Status: Living with partner  Intimate Partner Violence: Not At Risk (02/06/2024)   Humiliation, Afraid, Rape, and Kick questionnaire    Fear of Current or Ex-Partner: No    Emotionally Abused: No    Physically Abused: No    Sexually Abused: No    Family History  Problem Relation Age of Onset   COPD Mother    Hypertension Mother    COPD Father    Breast cancer Maternal Grandmother 22   Breast cancer Cousin 50       maternal side      Review of systems complete and found to be negative unless listed above      PHYSICAL EXAM  General: Well developed, poorly nourished, in respiratory acute distress HEENT:  Normocephalic and atramatic Neck:  No JVD.  Lungs: Diffuse  rhonchi bilaterally to auscultation and percussion. Heart: HRRR . Normal S1 and S2 without gallops or murmurs.  Abdomen: Bowel sounds are positive, abdomen soft and non-tender  Msk:  Back normal, normal gait. Normal strength and tone for age. Extremities: No clubbing, cyanosis or edema.   Neuro: Alert and oriented X 3. Psych:  Good affect, responds appropriately  Labs:   Lab Results  Component Value Date   WBC 34.4 (H) 02/09/2024   HGB 10.2 (L) 02/09/2024   HCT 29.7 (L) 02/09/2024   MCV 93.4 02/09/2024   PLT 442 (H) 02/09/2024    Recent Labs  Lab 02/06/24 1144 02/07/24 0512 02/09/24 0553  NA 130*   < > 126*  K 3.2*   < > 4.8  CL 88*   < > 83*  CO2 33*   < >  32  BUN 15   < > 53*  CREATININE 0.65   < > 1.17*  CALCIUM  7.9*   < > 8.7*  PROT 6.1*  --   --   BILITOT 0.8  --   --   ALKPHOS 124  --   --   ALT 23  --   --   AST 27  --   --   GLUCOSE 109*   < > 145*   < > = values in this interval not displayed.   Lab Results  Component Value Date   TROPONINI <0.03 09/30/2018    Lab Results  Component Value Date   CHOL 179 03/01/2023   Lab Results  Component Value Date   HDL 123 03/01/2023   Lab Results  Component Value Date   LDLCALC 46 03/01/2023   Lab Results  Component Value Date   TRIG 47 03/01/2023   Lab Results  Component Value Date   CHOLHDL 1.5 03/01/2023   No results found for: "LDLDIRECT"    Radiology: [facility] Chest Port 1 View Result Date: 02/09/2024 CLINICAL DATA:  Dyspnea EXAM: PORTABLE CHEST 1 VIEW COMPARISON:  02/06/2024 FINDINGS: Cardiac shadow is stable. Lungs are hyperinflated. No focal infiltrate or effusion is seen. No bony abnormality is noted. IMPRESSION: Hyperinflation without acute abnormality. Electronically Signed   By: Violeta Grey M.D.   On: 02/09/2024 10:36   DG Chest Port 1 View Result Date: 02/06/2024 CLINICAL DATA:  Shortness of breath EXAM: PORTABLE CHEST 1 VIEW COMPARISON:  Chest x-ray performed Jan 24, 2024 FINDINGS: Lungs are  hyperexpanded. No significant effusion or pneumothorax. The previously observed left perihilar nodule is not clearly seen on the current exam. Several leads overlie the patient. Underlying changes osteopenia. IMPRESSION: 1. Hyperexpansion of the lungs which can be observed in the setting of COPD. Electronically Signed   By: Reagan Camera M.D.   On: 02/06/2024 14:44   DG Chest Port 1 View Result Date: 01/24/2024 CLINICAL DATA:  Shortness of breath EXAM: PORTABLE CHEST 1 VIEW COMPARISON:  X-ray 03/30/2020.  CT 03/31/2020 FINDINGS: Hyperinflation with chronic lung changes. Blunting of the costophrenic angles could be tiny effusion versus pleural thickening. Slight lung base opacities. No pneumothorax or edema. Question left midlung nodule perihilar. Different appearance than prior x-ray normal cardiopericardial silhouette. Calcified aorta. Osteopenia and degenerative changes IMPRESSION: Hyperinflation with chronic changes. Question tiny pleural effusion versus pleural thickening and mild lung base opacities. Atelectasis versus subtle infiltrate. There is a small nodular focus just lateral to left lung hilum which is different appearance on the old x-ray. A subtle lesion is not excluded. Chest recommended when appropriate Electronically Signed   By: Adrianna Horde M.D.   On: 01/24/2024 11:17   CT Hip Left Wo Contrast Result Date: 01/21/2024 CLINICAL DATA:  Hip trauma, fracture suspected, xray done EXAM: CT OF THE LEFT HIP WITHOUT CONTRAST TECHNIQUE: Multidetector CT imaging of the left hip was performed according to the standard protocol. Multiplanar CT image reconstructions were also generated. RADIATION DOSE REDUCTION: This exam was performed according to the departmental dose-optimization program which includes automated exposure control, adjustment of the mA and/or kV according to patient size and/or use of iterative reconstruction technique. COMPARISON:  Same day radiographs of the left hip dated 01/21/2024 at  4:54 p.m. FINDINGS: Bones/Joint/Cartilage Acute minimally displaced parasymphyseal fracture of the left pubic body. Pubic symphysis appears anatomically aligned. The remainder of the visualized bones appear intact. The left femoral head is seated within the acetabula with mild  degenerative changes of the left hip. Ligaments Suboptimally assessed by CT. Muscles and Tendons Musculature is within normal limits.  No acute abnormality. Soft tissues No fluid collection or hematoma. Peripheral atherosclerotic vascular calcifications are noted. IMPRESSION: Acute minimally displaced parasymphyseal fracture of the left pubic body. Pubic symphysis appears anatomically aligned. Electronically Signed   By: Mannie Seek M.D.   On: 01/21/2024 18:04   DG Hip Unilat W or Wo Pelvis 2-3 Views Left Result Date: 01/21/2024 CLINICAL DATA:  Left hip pain following a fall yesterday. EXAM: DG HIP (WITH OR WITHOUT PELVIS) 2-3V LEFT COMPARISON:  None Available. FINDINGS: Minimal bilateral hip degenerative changes. No fracture or dislocation seen. Bilateral common iliac artery stents and atheromatous calcifications. Lower lumbar spine degenerative changes. IMPRESSION: 1. No fracture. 2. Minimal bilateral hip degenerative changes. Electronically Signed   By: Catherin Closs M.D.   On: 01/21/2024 17:09    EKG: Tachycardic irregular possibly atrial tachycardia rate of 150 possibly atrial fibrillation poor tracing with artifact  ASSESSMENT AND PLAN:  Respiratory failure Cough wheezing Shortness of breath Palpitations Generalized weakness Smoking Hypoxemia Peripheral vascular disease End-stage COPD Hypertension . Plan Agree with admit to telemetry for respiratory failure recommend supplemental oxygen  BiPAP steroids antibiotics Consider anticoagulation with heparin  intravenously for irregular heartbeat possible atrial fibrillation Rate controlled with Cardizem  IV or p.o. to a rate of 4 around 110-120 Continue inhalers as  necessary for COPD Advised patient to refrain from tobacco abuse Hypertension management with Cardizem  Echocardiogram helpful for left ventricular function wall motion and valvular structure No indication for invasive cardiac procedures at this stage  Signed: Antonette Batters MD 02/09/2024, 12:20 PM

## 2024-02-09 NOTE — Progress Notes (Signed)
 PT Cancellation Note  Patient Details Name: Wendy Ferrell MRN: [id number] DOB: 16-Jun-1956   Cancelled Treatment:    Reason Eval/Treat Not Completed: Medical issues which prohibited therapy. Per RN pt to transfer to ICU due to respiratory status, PT to sign off, please re-consult when pt is medically appropriate.   Darien Eden PT, DPT 11:20 AM,02/09/24

## 2024-02-09 NOTE — Progress Notes (Addendum)
 pt is having a hard time coughing up secretion, pt have scheduled mucinex . Lung sound diminished and rhonchii. RN place her on a Bipap at the moment because she is so dyspneic, no hypoxic event, she was on a 4L [place] which is her home dose. Pt also on solumedrol and some breathing treatment. Patient came in for COPD exacerbation with AFRVR. Notify provider if pt can have acapella, ordered place and also incentive spirometer. No other concern at the moment. Plan of care continued.  Update: pt with increase work of breathing this morning. Lung sound still with rhonchii, spo2 on a bipap is 91%, tachypneic at 25, other VSS. Pt with minimal urine, bladder scan is 969 ml, notify provider, if we can do in and out. Pt also c/o constipation, asked for suppository. Asked RT if pt can have PRN breathing treatment. Order place for in and out for retention. Dulcolax for constipation and morphine  for SOB.

## 2024-02-09 NOTE — Progress Notes (Addendum)
 PROGRESS NOTE    Wendy Ferrell  YNW:[id number] DOB: 1956/03/23 DOA: 02/06/2024 PCP: Shari Daughters, MD   Assessment & Plan:   Principal Problem:   Afib [facility]) Active Problems:   Chronic obstructive pulmonary disease with (acute) exacerbation (HCC)   Chronic respiratory failure with hypoxia (HCC)   COPD exacerbation (HCC)   A-fib (HCC)  Assessment and Plan: Acute on chronic hypoxic and hypercapnic respiratory failure: worsening dyspnea. Repeat CXR shows  hyperinflation but no focal infiltrate or effusion. Continue on BiPAP and wean as tolerated. NPO while on BiPAP. Transferred to stepdown   Acute COPD exacerbation: continue on doxy, IV steroids, bronchodilators & encourage incentive spirometry    A. fib: w/ RVR. New onset. Continue on IV dilt drip and wean as tolerated. Continue on eliquis . Cardio consulted   Fluid overload: concern for CHF. No previous hx of CHF. Echo ordered. Cardio consulted     HTN: holding losartan , chlorthalidone secondary to AKI   AKI: Cr is trending up today. Avoid nephrotoxic meds    Nicotine  dependence: pt stated she quit smoking    PAD: continue on home dose of plavix , eliquis . Will check ABI for cold feet & bluish discoloration  Severe protein malnutrition: continue on nutritional supplement   Thrombocytosis: etiology unclear. Will continue to monitor   Normocytic anemia: H&H are trending up. Will continue to monitor   Hyponatremia: labile. Started on NaCl tabs. Will continue to monitor   Anxiety: severity unknown. Ativan prn   DVT prophylaxis: eliquis   Code Status: full  Family Communication: discussed pt's care w/ pt's daughter, Craige Dixon, and answered her questions  Disposition Plan: depends on PT/OT recs (consult d/c secondary to pt unable to participate w/ therapy currently)   Level of care: Progressive  Status is: Inpatient Remains inpatient appropriate because: severity of illness, requiring IV steroids & BiPAP  CRITICAL  CARE Performed by: Alphonsus Jeans   Total critical care time: 40 minutes  Critical care time was exclusive of separately billable procedures and treating other patients.  Critical care was necessary to treat or prevent imminent or life-threatening deterioration.  Critical care was time spent personally by me on the following activities: development of treatment plan with patient and/or surrogate as well as nursing, discussions with consultants, evaluation of patient's response to treatment, examination of patient, obtaining history from patient or surrogate, ordering and performing treatments and interventions, ordering and review of laboratory studies, ordering and review of radiographic studies, pulse oximetry and re-evaluation of patient's condition.    Consultants:  Cardio  Procedures:   Antimicrobials: doxy    Subjective: Pt c/o significant shortness of breath.  Objective: Vitals:   02/09/24 0015 02/09/24 0325 02/09/24 0709 02/09/24 0803  BP: 139/74 (!) 153/67    Pulse: 93 (!) 37    Resp: (!) 22 (!) 25  (!) 25  Temp: 98.2 F (36.8 C) 97.9 F (36.6 C)    TempSrc: Axillary     SpO2: 100% 91% 92% 99%  Weight:      Height:        Intake/Output Summary (Last 24 hours) at 02/09/2024 0833 Last data filed at 02/09/2024 0500 Gross per 24 hour  Intake 240 ml  Output 1200 ml  Net -960 ml   Filed Weights   02/06/24 1141  Weight: 45.4 kg    Examination:  General exam: appears uncomfortable Respiratory system: course breath sounds b/l. Rales b/l (likely upper resp) Cardiovascular system: irregularly irregular  Gastrointestinal system: abd is soft, NT, ND &  hypoactive bowel sounds  Central nervous system: alert & oriented. Moves all extremities  Psychiatry: Judgement and insight appears at baseline. Flat mood and affect     Data Reviewed: I have personally reviewed following labs and imaging studies  CBC: Recent Labs  Lab 02/06/24 1144 02/07/24 0512  02/08/24 0548 02/09/24 0553  WBC 16.3* 13.8* 19.2* 34.4*  HGB 10.4* 9.7* 9.5* 10.2*  HCT 30.2* 28.5* 27.0* 29.7*  MCV 93.2 93.1 92.8 93.4  PLT 494* 480* 476* 442*   Basic Metabolic Panel: Recent Labs  Lab 02/06/24 1144 02/07/24 0512 02/08/24 0548 02/09/24 0553  NA 130* 130* 127* 126*  K 3.2* 4.2 4.1 4.8  CL 88* 90* 84* 83*  CO2 33* 32 31 32  GLUCOSE 109* 185* 146* 145*  BUN 15 16 29* 53*  CREATININE 0.65 0.66 0.85 1.17*  CALCIUM  7.9* 8.6* 8.6* 8.7*  MG 1.8  --   --   --   PHOS 3.0  --   --   --    GFR: Estimated Creatinine Clearance: 33.9 mL/min (A) (by C-G formula based on SCr of 1.17 mg/dL (H)). Liver Function Tests: Recent Labs  Lab 02/06/24 1144  AST 27  ALT 23  ALKPHOS 124  BILITOT 0.8  PROT 6.1*  ALBUMIN 3.4*   No results for input(s): "LIPASE", "AMYLASE" in the last 168 hours. No results for input(s): "AMMONIA" in the last 168 hours. Coagulation Profile: Recent Labs  Lab 02/06/24 1300  INR 1.1   Cardiac Enzymes: No results for input(s): "CKTOTAL", "CKMB", "CKMBINDEX", "TROPONINI" in the last 168 hours. BNP (last 3 results) No results for input(s): "PROBNP" in the last 8760 hours. HbA1C: No results for input(s): "HGBA1C" in the last 72 hours. CBG: No results for input(s): "GLUCAP" in the last 168 hours. Lipid Profile: No results for input(s): "CHOL", "HDL", "LDLCALC", "TRIG", "CHOLHDL", "LDLDIRECT" in the last 72 hours. Thyroid  Function Tests: Recent Labs    02/06/24 1144  TSH 0.462   Anemia Panel: No results for input(s): "VITAMINB12", "FOLATE", "FERRITIN", "TIBC", "IRON", "RETICCTPCT" in the last 72 hours. Sepsis Labs: Recent Labs  Lab 02/06/24 1300  LATICACIDVEN 1.4    Recent Results (from the past 240 hours)  Resp panel by RT-PCR (RSV, Flu A&B, Covid) Anterior Nasal Swab     Status: None   Collection Time: 02/06/24 11:44 AM   Specimen: Anterior Nasal Swab  Result Value Ref Range Status   SARS Coronavirus 2 by RT PCR NEGATIVE  NEGATIVE Final    Comment: (NOTE) SARS-CoV-2 target nucleic acids are NOT DETECTED.  The SARS-CoV-2 RNA is generally detectable in upper respiratory specimens during the acute phase of infection. The lowest concentration of SARS-CoV-2 viral copies this assay can detect is 138 copies/mL. A negative result does not preclude SARS-Cov-2 infection and should not be used as the sole basis for treatment or other patient management decisions. A negative result may occur with  improper specimen collection/handling, submission of specimen other than nasopharyngeal swab, presence of viral mutation(s) within the areas targeted by this assay, and inadequate number of viral copies(<138 copies/mL). A negative result must be combined with clinical observations, patient history, and epidemiological information. The expected result is Negative.  Fact Sheet for Patients:  BloggerCourse.com  Fact Sheet for Healthcare Providers:  SeriousBroker.it  This test is no t yet approved or cleared by the [place]  FDA and  has been authorized for detection and/or diagnosis of SARS-CoV-2 by FDA under an Emergency Use Authorization (EUA). This EUA will remain  in effect (meaning this test can be used) for the duration of the COVID-19 declaration under Section 564(b)(1) of the Act, 21 U.S.C.section 360bbb-3(b)(1), unless the authorization is terminated  or revoked sooner.       Influenza A by PCR NEGATIVE NEGATIVE Final   Influenza B by PCR NEGATIVE NEGATIVE Final    Comment: (NOTE) The Xpert Xpress SARS-CoV-2/FLU/RSV plus assay is intended as an aid in the diagnosis of influenza from Nasopharyngeal swab specimens and should not be used as a sole basis for treatment. Nasal washings and aspirates are unacceptable for Xpert Xpress SARS-CoV-2/FLU/RSV testing.  Fact Sheet for Patients: BloggerCourse.com  Fact Sheet for Healthcare  Providers: SeriousBroker.it  This test is not yet approved or cleared by the [place]  FDA and has been authorized for detection and/or diagnosis of SARS-CoV-2 by FDA under an Emergency Use Authorization (EUA). This EUA will remain in effect (meaning this test can be used) for the duration of the COVID-19 declaration under Section 564(b)(1) of the Act, 21 U.S.C. section 360bbb-3(b)(1), unless the authorization is terminated or revoked.     Resp Syncytial Virus by PCR NEGATIVE NEGATIVE Final    Comment: (NOTE) Fact Sheet for Patients: BloggerCourse.com  Fact Sheet for Healthcare Providers: SeriousBroker.it  This test is not yet approved or cleared by the [place]  FDA and has been authorized for detection and/or diagnosis of SARS-CoV-2 by FDA under an Emergency Use Authorization (EUA). This EUA will remain in effect (meaning this test can be used) for the duration of the COVID-19 declaration under Section 564(b)(1) of the Act, 21 U.S.C. section 360bbb-3(b)(1), unless the authorization is terminated or revoked.  Performed at [facility], [address] [place], [place] [address]   Blood Culture (routine x 2)     Status: None (Preliminary result)   Collection Time: 02/06/24  1:00 PM   Specimen: BLOOD  Result Value Ref Range Status   Specimen Description BLOOD LEFT ANTECUBITAL  Final   Special Requests   Final    BOTTLES DRAWN AEROBIC AND ANAEROBIC Blood Culture adequate volume   Culture   Final    NO GROWTH 3 DAYS Performed at [facility], [address], [place], [place] [address]    Report Status PENDING  Incomplete  Blood Culture (routine x 2)     Status: None (Preliminary result)   Collection Time: 02/06/24  1:00 PM   Specimen: BLOOD  Result Value Ref Range Status   Specimen Description BLOOD BLOOD LEFT FOREARM  Final   Special Requests   Final    BOTTLES DRAWN  AEROBIC AND ANAEROBIC Blood Culture results may not be optimal due to an inadequate volume of blood received in culture bottles   Culture   Final    NO GROWTH 3 DAYS Performed at [facility], [address] [place], [place] [address]    Report Status PENDING  Incomplete         Radiology Studies: No results found.       Scheduled Meds:  apixaban   5 mg Oral BID   atorvastatin   40 mg Oral Daily   budesonide -glycopyrrolate -formoterol   2 puff Inhalation BID   clopidogrel   75 mg Oral Daily   diltiazem   30 mg Oral Q6H   docusate sodium   200 mg Oral BID   feeding supplement  237 mL Oral TID   guaiFENesin   1,200 mg Oral BID   ipratropium-albuterol   3 mL Nebulization TID   loratadine   10 mg Oral Daily  losartan   25 mg Oral Daily   methylPREDNISolone  (SOLU-MEDROL ) injection  40 mg Intravenous Q12H   polyethylene glycol  17 g Oral Daily   pregabalin   50 mg Oral BID   sodium chloride   1 g Oral BID WC   Continuous Infusions:  doxycycline  (VIBRAMYCIN ) IV 100 mg (02/08/24 2052)     LOS: 3 days       Alphonsus Jeans, MD [facility] Pager [phone]  If 7PM-7AM, please contact night-coverage [email or web address] 02/09/2024, 8:33 AM

## 2024-02-09 NOTE — Plan of Care (Signed)
  Problem: Clinical Measurements: Goal: Ability to maintain clinical measurements within normal limits will improve Outcome: Progressing Goal: Will remain free from infection Outcome: Progressing Goal: Cardiovascular complication will be avoided Outcome: Progressing   Problem: Pain Managment: Goal: General experience of comfort will improve and/or be controlled Outcome: Progressing   Problem: Safety: Goal: Ability to remain free from injury will improve Outcome: Progressing

## 2024-02-09 DEATH — deceased

## 2024-02-10 DIAGNOSIS — J441 Chronic obstructive pulmonary disease with (acute) exacerbation: Secondary | ICD-10-CM | POA: Diagnosis not present

## 2024-02-10 LAB — ECHOCARDIOGRAM COMPLETE
AR max vel: 3.41 cm2
AV Peak grad: 1.7 mmHg
Ao pk vel: 0.66 m/s
Height: 64 in
Weight: 1600 [oz_av]

## 2024-02-10 LAB — BLOOD GAS, ARTERIAL
Acid-Base Excess: 4 mmol/L — ABNORMAL HIGH (ref 0.0–2.0)
Bicarbonate: 35.5 mmol/L — ABNORMAL HIGH (ref 20.0–28.0)
Delivery systems: POSITIVE
Expiratory PAP: 5 cmH2O
FIO2: 50 %
Inspiratory PAP: 12 cmH2O
O2 Saturation: 96.9 %
Patient temperature: 37
pCO2 arterial: 93 mmHg (ref 32–48)
pH, Arterial: 7.19 — CL (ref 7.35–7.45)
pO2, Arterial: 81 mmHg — ABNORMAL LOW (ref 83–108)

## 2024-02-10 LAB — HEMOGLOBIN A1C
Hgb A1c MFr Bld: 5.7 % — ABNORMAL HIGH (ref 4.8–5.6)
Mean Plasma Glucose: 116.89 mg/dL

## 2024-02-10 MED ORDER — GLYCOPYRROLATE 0.2 MG/ML IJ SOLN
0.2000 mg | INTRAMUSCULAR | Status: DC | PRN
  Administered 2024-02-10: 0.2 mg via INTRAVENOUS
  Filled 2024-02-10: qty 1

## 2024-02-10 MED ORDER — GLYCOPYRROLATE 0.2 MG/ML IJ SOLN: 0.2000 mg | INTRAMUSCULAR | Status: DC | PRN

## 2024-02-10 MED ORDER — MORPHINE SULFATE (PF) 2 MG/ML IV SOLN
2.0000 mg | INTRAVENOUS | Status: DC | PRN
  Administered 2024-02-10: 4 mg via INTRAVENOUS
  Administered 2024-02-10: 2 mg via INTRAVENOUS
  Administered 2024-02-10: 4 mg via INTRAVENOUS
  Administered 2024-02-10: 2 mg via INTRAVENOUS
  Filled 2024-02-10: qty 1
  Filled 2024-02-10: qty 2
  Filled 2024-02-10: qty 1
  Filled 2024-02-10: qty 2

## 2024-02-10 MED ORDER — MORPHINE 100MG IN NS 100ML (1MG/ML) PREMIX INFUSION
0.0000 mg/h | INTRAVENOUS | Status: DC
  Administered 2024-02-10: 5 mg/h via INTRAVENOUS
  Filled 2024-02-10: qty 100

## 2024-02-10 MED ORDER — SODIUM CHLORIDE 0.9 % IV SOLN: INTRAVENOUS | Status: DC

## 2024-02-10 MED ORDER — GLYCOPYRROLATE 1 MG PO TABS: 1.0000 mg | ORAL_TABLET | ORAL | Status: DC | PRN

## 2024-02-10 MED ORDER — POLYVINYL ALCOHOL 1.4 % OP SOLN
1.0000 [drp] | Freq: Four times a day (QID) | OPHTHALMIC | Status: DC | PRN
Start: 2024-02-10 — End: 2024-02-10

## 2024-02-10 MED ORDER — FUROSEMIDE 10 MG/ML IJ SOLN: 20.0000 mg | INTRAMUSCULAR | Status: AC

## 2024-02-10 MED ORDER — FUROSEMIDE 10 MG/ML IJ SOLN
INTRAMUSCULAR | Status: AC
  Administered 2024-02-10: 20 mg via INTRAVENOUS
  Filled 2024-02-10: qty 2

## 2024-02-10 MED ORDER — MORPHINE 100MG IN NS 100ML (1MG/ML) PREMIX INFUSION: 1.0000 mg/h | INTRAVENOUS | Status: DC

## 2024-02-10 MED ORDER — MORPHINE BOLUS VIA INFUSION
5.0000 mg | INTRAVENOUS | Status: DC | PRN
  Administered 2024-02-10: 5 mg via INTRAVENOUS

## 2024-02-10 MED ORDER — MIDAZOLAM HCL 2 MG/2ML IJ SOLN
2.0000 mg | INTRAMUSCULAR | Status: DC | PRN
  Administered 2024-02-10: 4 mg via INTRAVENOUS
  Filled 2024-02-10: qty 4

## 2024-02-11 LAB — CULTURE, BLOOD (ROUTINE X 2)
Culture: NO GROWTH
Culture: NO GROWTH
Special Requests: ADEQUATE

## 2024-02-22 ENCOUNTER — Other Ambulatory Visit: Payer: Self-pay | Admitting: Internal Medicine

## 2024-02-22 DIAGNOSIS — J449 Chronic obstructive pulmonary disease, unspecified: Secondary | ICD-10-CM

## 2024-02-24 ENCOUNTER — Ambulatory Visit: Admitting: Cardiovascular Disease

## 2024-02-28 ENCOUNTER — Ambulatory Visit: Admitting: Internal Medicine

## 2024-03-08 ENCOUNTER — Other Ambulatory Visit: Payer: Self-pay | Admitting: Internal Medicine

## 2024-03-08 DIAGNOSIS — J301 Allergic rhinitis due to pollen: Secondary | ICD-10-CM

## 2024-03-10 NOTE — Death Summary Note (Addendum)
 Death Summary  Wendy Ferrell FAO:[id number] DOB: November 13, 1955 DOA: 02/29/24  PCP: Wendy Daughters, MD PCP/Office notified:   Admit date: Feb 29, 2024 Date of Death: 03/04/24  Final Diagnoses:  Principal Problem:   Afib (HCC) Active Problems:   Chronic obstructive pulmonary disease with (acute) exacerbation (HCC)   Chronic respiratory failure with hypoxia (HCC)   COPD exacerbation (HCC)   A-fib (HCC)    History of present illness:  HPI was taken from Wendy Ferrell:  Wendy Ferrell is a 68 y.o. 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   Patient was recently hospitalized for COPD exacerbation and discharged home on p.o. steroid, which she completed about 5 days ago.  Initially she felt better however about 4 to 5 days ago, she started to have productive cough and wheezing again.  She has been using around-the-clock Combivent  with little help.  This Wendy she started develop severe shortness of breath and strong palpitations, she tried to turn up the oxygen  from 4 to 5 L but without any relief of her breathing symptoms.  She also reported episodes of subjective fever and chills.  No nauseous vomiting no diarrhea.  Denied any chest pains.   ED Course: Afebrile, tachycardia and A-fib heart rate in the 140-150s, blood pressure 150/100 tachypneic breathing rate 28-30 O2 saturation 96% on 4 to 5 L.  Chest x-ray showed no acute infiltrates.  Blood work showed sodium 130 potassium 3.2, BUN 15 creatinine 0.6 WBC 16 hemoglobin 10.4 COVID-negative.   Patient was given IV Solu-Medrol , DuoNebs, Cardizem  10 mg IV push x 1 and 1 dose of 500 mg azithromycin  and ceftriaxone  in the ED.    As per Wendy Ferrell on 03-04-24:  The patient was seen and examined.  She was obtunded and unresponsive to painful stimuli on  BiPAP and despite few hours on BiPAP ABG came back with pH of 7.19, PCO2 of 93 and pO2 of 81 with HCO3 of 35.5 and O2 sat of 96.9% on 50% FiO2 indicating acute hypoxic and hypercarbic respiratory failure with CO2 narcosis secondary to end-stage severe acute exacerbation of COPD and pulmonary edema with grim prognosis.Wendy Ferrell was graciously made with her daughter by the ICU team and she clearly indicated that the patient is DNR and DNI and she would not want to pursue aggressive management.  The family wanted to proceed with full comfort care.  The patient's BiPAP was therefore discontinued and she was placed on O2 by nasal cannula for comfort measures per the family's wishes      Hospital Course:  Acute on chronic hypoxic and hypercapnic respiratory failure: worsening dyspnea. Repeat CXR shows  hyperinflation but no focal infiltrate or effusion. Continue on BiPAP and wean as tolerated. NPO while on BiPAP. Transferred to stepdown on 02/09/24 for worsening dyspnea.    Acute COPD exacerbation: severe, likely end stage. Continue on doxy, IV steroids, bronchodilators & encourage incentive spirometry    A. fib: w/ RVR. New onset. Continue on IV dilt drip and wean as tolerated. Continue on eliquis . Cardio consulted   Fluid overload: concern for CHF. No previous hx of CHF. Echo ordered. Cardio consulted     HTN: holding losartan , chlorthalidone secondary to AKI    AKI: Cr is trending up today. Avoid nephrotoxic meds    Nicotine  dependence: pt stated she quit smoking  PAD: continue on home dose of plavix , eliquis . Will check ABI for cold feet & bluish discoloration   Severe protein malnutrition: continue on nutritional supplement    Thrombocytosis: etiology unclear. Will continue to monitor    Normocytic anemia: H&H are trending up. Will continue to monitor    Hyponatremia: labile. Started on NaCl tabs. Will continue to monitor    Anxiety: severity unknown. Ativan prn   DM2: well controlled,  HbA1c 5.7. Glucose sugar checks q4hrs. Insulin not needed currently   Time of death: 7:01 am  Signed:  Alphonsus Ferrell  [facility] 02/09/2024, 8:12 AM

## 2024-03-10 NOTE — IPAL (Addendum)
 INTERDISCIPLINARY GOALS OF CARE FAMILY MEETING     Date carried out: 02/18/2024 Location of the meeting: Bedside   Member's involved: NP and Family Member or next of kin   Durable Power of Attorney or Environmental health practitioner:    Discussion:  Advance Care Planning/Goals of Care discussion was performed during the course of treatment to decide on type of care right for this patient following transfer to the ICU.   I met with patient's daughters Wendy Ferrell and Wendy Ferrell to discuss goals of care in details following change in patient's current status. Reviewed patient's ABG showing severe respiratory acidosis and hypoxia despite being on BiPAP and rapidly declining clinical status. We discussed the options of intubation but per family patient would not wish to be placed on the ventilator to prolong her life.   Discussed prognosis, expected outcome with or without ongoing aggressive treatments and the options for de-escalation of care.   Diagnosis(es): Acute hypoxic hypercapnic respiratory failure secondary end stage severe AECOPD, Pneumonia, Pulmonary Edema and Atrial Fibrillation with RVR Prognosis: Poor Code Status: DNR Disposition: ICU Next Steps:  Family understands the situation. They have consented and agreed to DNR/DNI and would not wish to pursue any aggressive treatment.  Patient's family would like to proceed with full comfort care.     Family are satisfied with Plan of action and management. All questions answered     Total Time Spent Face to Face addressing advance care planning in the presence of the Patient: 35 minutes     Alonza Arthurs, DNP, CCRN, FNP-C, AGACNP-BC Acute Care & Family Nurse Practitioner  [facility]  See Amion for personal pager PCCM on call pager [phone] until 7 am

## 2024-03-10 NOTE — Progress Notes (Signed)
 The patient was seen and examined.  She was obtunded and unresponsive to painful stimuli on BiPAP and despite few hours on BiPAP ABG came back with pH of 7.19, PCO2 of 93 and pO2 of 81 with HCO3 of 35.5 and O2 sat of 96.9% on 50% FiO2 indicating acute hypoxic and hypercarbic respiratory failure with CO2 narcosis secondary to end-stage severe acute exacerbation of COPD and pulmonary edema with grim prognosis.Wendy Ferrell was graciously made with her daughter by the ICU team and she clearly indicated that the patient is DNR and DNI and she would not want to pursue aggressive management.  The family wanted to proceed with full comfort care.  The patient's BiPAP was therefore discontinued and she was placed on O2 by nasal cannula for comfort measures per the family's wishes.

## 2024-03-10 NOTE — Progress Notes (Signed)
   03/02/2024 0300  Spiritual Encounters  Type of Visit Initial  Care provided to: Pt and family  Conversation partners present during encounter Nurse  Referral source Nurse (RN/NT/LPN)  Reason for visit End-of-life  OnCall Visit Yes  Spiritual Framework  Presenting Themes Significant life change  Community/Connection Family  Patient Stress Factors Major life changes  Family Stress Factors Major life changes  Interventions  Spiritual Care Interventions Made Established relationship of care and support;Compassionate presence;Reflective listening;Normalization of emotions;Supported grief process  Intervention Outcomes  Outcomes Awareness of support;Patient family open to resources  Spiritual Care Plan  Spiritual Care Issues Still Outstanding Chaplain will continue to follow   Chaplain responded to nurse page. Chaplain provided compassionate presence and reflective listening as family spoke about patient's health challenges. Chaplain provided hospitality and comfort to family members. Chaplain will continue to follow as desired.

## 2024-03-10 NOTE — Progress Notes (Signed)
 68 year old female with a history of end-stage COPD, chronic hypoxic hypercapnic respiratory failure on home oxygen  at 4 L at baseline, atrial fibrillation on Eliquis , PVD, and llDM who was admitted to [facility] service with acute hypoxic hypercapnic respiratory failure secondary to AECOPD and A-fib with RVR.  Patient developed worsening respiratory failure suspected in the setting of developing pulmonary edema placed on BiPAP.  She was transferred to the ICU and PCCM consulted for high risk for decompensation.  Discussed with patient's daughter regarding CODE STATUS, they made it very clear that patient would like to like to be on the ventilator should her clinical status decompensate.  CODE STATUS changed to DNR limited initially but on further conversation with patient's daughter they decided they would not like their mother to be intubated and would prefer a more natural death.  Patient transition to comfort care and CODE STATUS changed to DNR-comfort.  See IPAL note.  PCCM to sign off    Alonza Arthurs, DNP, CCRN, FNP-C, AGACNP-BC Acute Care & Family Nurse Practitioner  [facility]  See Amion for personal pager PCCM on call pager [phone] until 7 am

## 2024-03-10 NOTE — Progress Notes (Signed)
 Patient time of death 22am. Family at bedside. Funeral home release form completed with daughter, Gala Jubilee [phone]). Funeral home chosen, [facility] in [place]. All belongings taken by daughter-cellphone, inhalers, bag with personal hygiene products.   [place] contacted by this nurse. Case number provided by Adolfo Hooker # provided [phone]. Prep for eye and tissue.

## 2024-03-10 NOTE — Progress Notes (Signed)
 Took patient off bipap and placed on 4LPM for comfort measures.

## 2024-03-10 DEATH — deceased

## 2024-03-23 ENCOUNTER — Other Ambulatory Visit: Payer: Self-pay | Admitting: Internal Medicine

## 2024-03-23 DIAGNOSIS — E782 Mixed hyperlipidemia: Secondary | ICD-10-CM

## 2024-04-08 ENCOUNTER — Other Ambulatory Visit: Payer: Self-pay | Admitting: Internal Medicine

## 2024-04-08 DIAGNOSIS — I10 Essential (primary) hypertension: Secondary | ICD-10-CM
